# Patient Record
Sex: Female | Born: 1955 | Race: Black or African American | Hispanic: No | Marital: Married | Smoking: Never smoker
Health system: Southern US, Community
[De-identification: ages and names within clinical notes are randomized; demographics above are authoritative.]

## PROBLEM LIST (undated history)

## (undated) DIAGNOSIS — D249 Benign neoplasm of unspecified breast: Secondary | ICD-10-CM

## (undated) DIAGNOSIS — M48061 Spinal stenosis, lumbar region without neurogenic claudication: Secondary | ICD-10-CM

## (undated) DIAGNOSIS — D649 Anemia, unspecified: Secondary | ICD-10-CM

## (undated) DIAGNOSIS — R42 Dizziness and giddiness: Secondary | ICD-10-CM

## (undated) DIAGNOSIS — IMO0002 Reserved for concepts with insufficient information to code with codable children: Secondary | ICD-10-CM

## (undated) DIAGNOSIS — T7840XA Allergy, unspecified, initial encounter: Secondary | ICD-10-CM

## (undated) DIAGNOSIS — I10 Essential (primary) hypertension: Secondary | ICD-10-CM

## (undated) DIAGNOSIS — Z8619 Personal history of other infectious and parasitic diseases: Secondary | ICD-10-CM

## (undated) HISTORY — DX: Allergy, unspecified, initial encounter: T78.40XA

## (undated) HISTORY — DX: Dizziness and giddiness: R42

## (undated) HISTORY — DX: Benign neoplasm of unspecified breast: D24.9

## (undated) HISTORY — DX: Spinal stenosis, lumbar region without neurogenic claudication: M48.061

## (undated) HISTORY — DX: Personal history of other infectious and parasitic diseases: Z86.19

## (undated) HISTORY — DX: Essential (primary) hypertension: I10

## (undated) HISTORY — DX: Anemia, unspecified: D64.9

## (undated) HISTORY — DX: Reserved for concepts with insufficient information to code with codable children: IMO0002

---

## 2006-03-12 ENCOUNTER — Encounter (INDEPENDENT_AMBULATORY_CARE_PROVIDER_SITE_OTHER): Payer: Self-pay | Admitting: *Deleted

## 2006-04-11 ENCOUNTER — Encounter (INDEPENDENT_AMBULATORY_CARE_PROVIDER_SITE_OTHER): Payer: Self-pay | Admitting: *Deleted

## 2006-08-16 ENCOUNTER — Encounter (INDEPENDENT_AMBULATORY_CARE_PROVIDER_SITE_OTHER): Payer: Self-pay | Admitting: *Deleted

## 2006-12-19 ENCOUNTER — Encounter (INDEPENDENT_AMBULATORY_CARE_PROVIDER_SITE_OTHER): Payer: Self-pay | Admitting: *Deleted

## 2007-10-06 ENCOUNTER — Encounter (INDEPENDENT_AMBULATORY_CARE_PROVIDER_SITE_OTHER): Payer: Self-pay | Admitting: *Deleted

## 2007-12-10 ENCOUNTER — Encounter (INDEPENDENT_AMBULATORY_CARE_PROVIDER_SITE_OTHER): Payer: Self-pay | Admitting: *Deleted

## 2007-12-10 ENCOUNTER — Other Ambulatory Visit: Admission: RE | Admit: 2007-12-10 | Discharge: 2007-12-10 | Payer: Self-pay | Admitting: Obstetrics & Gynecology

## 2007-12-10 ENCOUNTER — Encounter: Payer: Self-pay | Admitting: Family

## 2007-12-11 ENCOUNTER — Encounter (INDEPENDENT_AMBULATORY_CARE_PROVIDER_SITE_OTHER): Payer: Self-pay | Admitting: *Deleted

## 2007-12-17 ENCOUNTER — Encounter: Payer: Self-pay | Admitting: Family

## 2008-01-07 LAB — CONVERTED CEMR LAB: Pap Smear: NORMAL

## 2008-06-06 ENCOUNTER — Emergency Department (HOSPITAL_BASED_OUTPATIENT_CLINIC_OR_DEPARTMENT_OTHER): Admission: EM | Admit: 2008-06-06 | Discharge: 2008-06-06 | Payer: Self-pay | Admitting: Emergency Medicine

## 2008-06-06 ENCOUNTER — Ambulatory Visit: Payer: Self-pay | Admitting: Interventional Radiology

## 2008-06-15 ENCOUNTER — Ambulatory Visit: Payer: Self-pay | Admitting: *Deleted

## 2008-06-15 DIAGNOSIS — I10 Essential (primary) hypertension: Secondary | ICD-10-CM | POA: Insufficient documentation

## 2008-06-15 DIAGNOSIS — J309 Allergic rhinitis, unspecified: Secondary | ICD-10-CM | POA: Insufficient documentation

## 2008-06-15 DIAGNOSIS — J40 Bronchitis, not specified as acute or chronic: Secondary | ICD-10-CM | POA: Insufficient documentation

## 2008-06-15 DIAGNOSIS — D649 Anemia, unspecified: Secondary | ICD-10-CM | POA: Insufficient documentation

## 2008-06-25 DIAGNOSIS — M48061 Spinal stenosis, lumbar region without neurogenic claudication: Secondary | ICD-10-CM | POA: Insufficient documentation

## 2008-06-25 DIAGNOSIS — IMO0002 Reserved for concepts with insufficient information to code with codable children: Secondary | ICD-10-CM | POA: Insufficient documentation

## 2008-06-30 ENCOUNTER — Ambulatory Visit: Payer: Self-pay | Admitting: *Deleted

## 2008-06-30 DIAGNOSIS — J45909 Unspecified asthma, uncomplicated: Secondary | ICD-10-CM | POA: Insufficient documentation

## 2008-07-14 ENCOUNTER — Telehealth (INDEPENDENT_AMBULATORY_CARE_PROVIDER_SITE_OTHER): Payer: Self-pay | Admitting: *Deleted

## 2009-03-16 ENCOUNTER — Ambulatory Visit: Payer: Self-pay | Admitting: Family

## 2009-03-18 ENCOUNTER — Ambulatory Visit: Payer: Self-pay | Admitting: Family

## 2009-03-18 LAB — CONVERTED CEMR LAB
CO2: 25 meq/L (ref 19–32)
Glucose, Bld: 90 mg/dL (ref 70–99)
HDL: 65 mg/dL (ref >39)
Hemoglobin: 13.5 g/dL (ref 12.0–15.0)
Lymphocytes Relative: 37 % (ref 12–46)
Lymphs Abs: 3.4 10*3/uL (ref 0.7–4.0)
MCHC: 32.2 g/dL (ref 30.0–36.0)
Monocytes Absolute: 0.8 10*3/uL (ref 0.1–1.0)
Monocytes Relative: 8 % (ref 3–12)
Neutro Abs: 5 10*3/uL (ref 1.7–7.7)
Neutrophils Relative %: 54 % (ref 43–77)
Potassium: 4.1 meq/L (ref 3.5–5.3)
RBC: 4.65 M/uL (ref 3.87–5.11)
Sodium: 140 meq/L (ref 135–145)
Total CHOL/HDL Ratio: 3.1
VLDL: 22 mg/dL (ref 0–40)
WBC: 9.3 10*3/uL (ref 4.0–10.5)

## 2009-03-21 ENCOUNTER — Encounter: Payer: Self-pay | Admitting: Family

## 2009-03-25 ENCOUNTER — Ambulatory Visit (HOSPITAL_BASED_OUTPATIENT_CLINIC_OR_DEPARTMENT_OTHER): Admission: RE | Admit: 2009-03-25 | Discharge: 2009-03-25 | Payer: Self-pay | Admitting: Internal Medicine

## 2009-03-25 ENCOUNTER — Other Ambulatory Visit: Admission: RE | Admit: 2009-03-25 | Discharge: 2009-03-25 | Payer: Self-pay | Admitting: Internal Medicine

## 2009-03-25 ENCOUNTER — Ambulatory Visit: Payer: Self-pay | Admitting: Diagnostic Radiology

## 2009-03-25 ENCOUNTER — Ambulatory Visit: Payer: Self-pay | Admitting: Family

## 2009-03-25 DIAGNOSIS — E785 Hyperlipidemia, unspecified: Secondary | ICD-10-CM

## 2009-03-25 DIAGNOSIS — E782 Mixed hyperlipidemia: Secondary | ICD-10-CM | POA: Insufficient documentation

## 2009-03-25 LAB — HM MAMMOGRAPHY: HM Mammogram: NEGATIVE

## 2009-04-22 ENCOUNTER — Telehealth (INDEPENDENT_AMBULATORY_CARE_PROVIDER_SITE_OTHER): Payer: Self-pay | Admitting: *Deleted

## 2009-04-25 ENCOUNTER — Ambulatory Visit: Payer: Self-pay | Admitting: Family

## 2009-04-25 DIAGNOSIS — T148XXA Other injury of unspecified body region, initial encounter: Secondary | ICD-10-CM | POA: Insufficient documentation

## 2009-04-26 ENCOUNTER — Telehealth (INDEPENDENT_AMBULATORY_CARE_PROVIDER_SITE_OTHER): Payer: Self-pay | Admitting: *Deleted

## 2009-10-18 ENCOUNTER — Encounter: Payer: Self-pay | Admitting: Family

## 2009-11-16 ENCOUNTER — Encounter: Payer: Self-pay | Admitting: Family

## 2009-11-16 HISTORY — PX: ENDOMETRIAL BIOPSY: SHX622

## 2009-12-06 LAB — CONVERTED CEMR LAB: Pap Smear: NORMAL

## 2009-12-30 ENCOUNTER — Ambulatory Visit: Payer: Self-pay | Admitting: Internal Medicine

## 2009-12-30 DIAGNOSIS — R51 Headache: Secondary | ICD-10-CM | POA: Insufficient documentation

## 2009-12-30 DIAGNOSIS — Z6841 Body Mass Index (BMI) 40.0 and over, adult: Secondary | ICD-10-CM | POA: Insufficient documentation

## 2009-12-30 DIAGNOSIS — R519 Headache, unspecified: Secondary | ICD-10-CM | POA: Insufficient documentation

## 2010-01-06 ENCOUNTER — Telehealth: Payer: Self-pay | Admitting: Family

## 2010-01-11 ENCOUNTER — Encounter: Payer: Self-pay | Admitting: Family

## 2010-01-11 DIAGNOSIS — E559 Vitamin D deficiency, unspecified: Secondary | ICD-10-CM | POA: Insufficient documentation

## 2010-01-16 ENCOUNTER — Telehealth: Payer: Self-pay | Admitting: Family

## 2010-01-16 ENCOUNTER — Ambulatory Visit: Payer: Self-pay | Admitting: Radiology

## 2010-01-16 ENCOUNTER — Emergency Department (HOSPITAL_BASED_OUTPATIENT_CLINIC_OR_DEPARTMENT_OTHER): Admission: EM | Admit: 2010-01-16 | Discharge: 2010-01-16 | Payer: Self-pay | Admitting: Emergency Medicine

## 2010-01-17 ENCOUNTER — Ambulatory Visit: Payer: Self-pay | Admitting: Internal Medicine

## 2010-01-17 ENCOUNTER — Encounter: Payer: Self-pay | Admitting: Internal Medicine

## 2010-01-17 DIAGNOSIS — R209 Unspecified disturbances of skin sensation: Secondary | ICD-10-CM | POA: Insufficient documentation

## 2010-01-17 DIAGNOSIS — R9431 Abnormal electrocardiogram [ECG] [EKG]: Secondary | ICD-10-CM | POA: Insufficient documentation

## 2010-01-21 ENCOUNTER — Encounter: Admission: RE | Admit: 2010-01-21 | Discharge: 2010-01-21 | Payer: Self-pay | Admitting: Internal Medicine

## 2010-01-23 ENCOUNTER — Telehealth: Payer: Self-pay | Admitting: Internal Medicine

## 2010-01-26 ENCOUNTER — Ambulatory Visit (HOSPITAL_COMMUNITY): Admission: RE | Admit: 2010-01-26 | Discharge: 2010-01-26 | Payer: Self-pay | Admitting: Internal Medicine

## 2010-01-26 ENCOUNTER — Encounter: Payer: Self-pay | Admitting: Internal Medicine

## 2010-01-26 ENCOUNTER — Ambulatory Visit: Payer: Self-pay | Admitting: Internal Medicine

## 2010-01-26 ENCOUNTER — Ambulatory Visit: Payer: Self-pay

## 2010-01-27 ENCOUNTER — Telehealth: Payer: Self-pay | Admitting: Internal Medicine

## 2010-01-30 ENCOUNTER — Telehealth: Payer: Self-pay | Admitting: Internal Medicine

## 2010-01-30 ENCOUNTER — Ambulatory Visit (HOSPITAL_BASED_OUTPATIENT_CLINIC_OR_DEPARTMENT_OTHER): Admission: RE | Admit: 2010-01-30 | Discharge: 2010-01-30 | Payer: Self-pay | Admitting: Internal Medicine

## 2010-01-30 ENCOUNTER — Ambulatory Visit: Payer: Self-pay | Admitting: Interventional Radiology

## 2010-01-30 ENCOUNTER — Ambulatory Visit: Payer: Self-pay | Admitting: Internal Medicine

## 2010-01-30 DIAGNOSIS — I776 Arteritis, unspecified: Secondary | ICD-10-CM | POA: Insufficient documentation

## 2010-01-30 LAB — CONVERTED CEMR LAB
ALT: 11 units/L (ref 0–35)
AST: 20 units/L (ref 0–37)
Anti Nuclear Antibody(ANA): NEGATIVE
Bilirubin, Direct: <0.1 mg/dL (ref 0.0–0.3)
Blood in Urine, dipstick: NEGATIVE
Complement C4, Body Fluid: 26 mg/dL (ref 16–47)
Glucose, Urine, Semiquant: NEGATIVE
Hep B S Ab: NEGATIVE
Nitrite: NEGATIVE
Protein, U semiquant: NEGATIVE
Specific Gravity, Urine: 1.015
Total Protein: 8.1 g/dL (ref 6.0–8.3)
WBC Urine, dipstick: NEGATIVE

## 2010-02-02 ENCOUNTER — Telehealth: Payer: Self-pay | Admitting: Internal Medicine

## 2010-02-16 ENCOUNTER — Telehealth: Payer: Self-pay | Admitting: Internal Medicine

## 2010-03-21 ENCOUNTER — Encounter: Payer: Self-pay | Admitting: Internal Medicine

## 2010-04-27 ENCOUNTER — Telehealth: Payer: Self-pay | Admitting: Internal Medicine

## 2010-05-03 ENCOUNTER — Ambulatory Visit: Admit: 2010-05-03 | Payer: Self-pay | Admitting: Internal Medicine

## 2010-05-09 NOTE — Progress Notes (Signed)
Summary: Side effects from BP Med  Phone Note Call from Patient   Caller: Patient Call For: Lemont Fillers FNP Summary of Call: Bp that pt. started lastnight as you asked her to do is causing her to have nausea, dizziness, drowziness, dry mouth, S.O.B, and fatigue feeling. Pt. states that she is going to stop this med. Initial call taken by: Michaelle Copas,  April 26, 2009 11:19 AM  Follow-up for Phone Call        Call patient and advise her to stop medication.  Advise patient to go to ER if severe shortness of breath.  I have sent a prescription to her pharmacy for a different BP med- she should start this medicine tomorrow if feeling better.   Follow-up by: Lemont Fillers FNP,  April 26, 2009 12:34 PM  Additional Follow-up for Phone Call Additional follow up Details #1::        spoke w/ patient says she isn't going to take a chance w/ having another reaction to medication so she is going to look for a natural alternative to help w/ bp but also notes that she has been under alot of stress and is working on diet and exercise to help resolved this issue...Marland KitchenMarland KitchenDoristine Devoid  April 26, 2009 3:16 PM     New/Updated Medications: HYDROCHLOROTHIAZIDE 25 MG TABS (HYDROCHLOROTHIAZIDE) one tablet by mouth daily Prescriptions: HYDROCHLOROTHIAZIDE 25 MG TABS (HYDROCHLOROTHIAZIDE) one tablet by mouth daily  #30 x 1   Entered and Authorized by:   Lemont Fillers FNP   Signed by:   Lemont Fillers FNP on 04/26/2009   Method used:   Electronically to        [facility]. [address]. [phone]* (retail)       [address]       [place], [place]  [address]       Ph: [phone] or [phone]       Fax: [phone]   RxID:   [id number]

## 2010-05-09 NOTE — Progress Notes (Signed)
Summary: Status update  Phone Note Call from Patient Call back at Home Phone [phone] Call back at [phone]   Caller: Patient Call For: D. Thomos Lemons DO Summary of Call: patient called and left voice  message stating she was seen by Dr Rutherford Guys and Laveda Norman dentist. She had a cavity filled and was informed that she did not need a root canal.  She states she has completed the trial of Benicar and would like to know what to do from here. She would like to know  if an earlier appointment could be made before December 13, for neuro evaluation. She states she had another episode yesterday with left temple and jaw pain which is still lingering this morning. Initial call taken by: Glendell Docker CMA,  February 16, 2010 8:27 AM  Follow-up for Phone Call        Pt states Dr Anne Hahn' office is not able to get her appointment moved up earlier [phone].  Pt states Dr Anne Hahn' office suggested that we call them to discuss need for earlier appt. Nicki Guadalajara Fergerson CMA Duncan Dull)  February 16, 2010 10:08 AM   Additional Follow-up for Phone Call Additional follow up Details #1::        Myriam Jacobson,  please see if neurologist at [facility] neuro can see pt earlier  D. Thomos Lemons DO  February 16, 2010 1:22 PM    Called [facility]  no sooner appt.  also call [facility] no appointment until February   Darral Dash  February 16, 2010 2:09 PM     Additional Follow-up for Phone Call Additional follow up Details #2::    darlene,  pt needs to continue benicar .  see rx D. Thomos Lemons DO,  February 16, 2010 1:23 PM  call placed to patient at [phone], she has been advised rx for Benicar has been sent to pharmacy.  New/Updated Medications: BENICAR 20 MG TABS (OLMESARTAN MEDOXOMIL) one by mouth once daily Prescriptions: BENICAR 20 MG TABS (OLMESARTAN MEDOXOMIL) one by mouth once daily  #30 x 3   Entered and Authorized by:   D. Thomos Lemons DO   Signed by:   D. Thomos Lemons DO on 02/16/2010   Method used:   Electronically to   [facility]. [address] [phone]* (retail)       [address]       [place], [place]  [address]       Ph: [phone] or [phone]       Fax: [phone]   RxID:   [id number]

## 2010-05-09 NOTE — Assessment & Plan Note (Signed)
Summary: Dr Artist Pais wants to see her prior to refill/mhf   Vital Signs:  Patient profile:   55 year old female Menstrual status:  postmenopausal Height:      67.5 inches Weight:      278.50 pounds BMI:     43.13 O2 Sat:      99 % on Room air Temp:     97.8 degrees F rectal Pulse rate:   74 / minute Pulse rhythm:   regular Resp:     20 per minute BP sitting:   146 / 76  (right arm) Cuff size:   large  Vitals Entered By: Glendell Docker CMA (January 30, 2010 3:43 PM)  O2 Flow:  Room air CC: Skin irritation Is Patient Diabetic? No Pain Assessment Patient in pain? no      Comments c/o irritation on forearms and shins , use of cortisone and tea tree oil soap with some improvement. She states she has burning from the cortisone cream. She states the numbness is related to her current symptoms. Appoitnment for dental work on the 8th of November.    Primary Care Provider:  Lemont Fillers FNP  CC:  Skin irritation.  History of Present Illness: 55 y/o AA female for f/u.  pt prev seen for unexplained facial sensation  MRI of brain negative IMPRESSION: 1. Normal MRI appearance of the brain for age. 2.  Negative bilateral fifth cranial nerve imaging. 3.  Symmetric appearing tonsillar pillar hypertrophy and maximal right level II cervical lymph nodes are nonspecific, but may be related to recent upper respiratory tract infection  2 D Echo reviewed Left ventricle: The cavity size was normal. There was mild focal     basal hypertrophy of the septum. Systolic function was normal. The     estimated ejection fraction was in the range of 60% to 65%. Wall     motion was normal; there were no regional wall motion abnormalities.     Doppler parameters are consistent with abnormal left ventricular     relaxation (grade 1 diastolic dysfunction).  pt still having abnormal facial sensation.    she also c/o rash on arms and legs  Preventive Screening-Counseling &  Management  Alcohol-Tobacco     Smoking Status: never  Allergies: 1)  ! Codeine 2)  ! 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    Social History: Occupation: housewife Married Never Smoked Alcohol use-no   husband is from [place]    Physical Exam  General:  alert, well-developed, and well-nourished.   Lungs:  normal respiratory effort, normal breath sounds, no crackles, and no wheezes.   Heart:  normal rate, regular rhythm, and no gallop.   Skin:  warm, dry  no suspicious lesions.   question lacy rash on arms and legs   Impression & Recommendations:  Problem # 1:  VASCULITIS (ICD-447.6) Pt with unexplained lacy rash on arms and legs.  consider vasculitis.   check labs  Orders: CRP, high sensitivity-[facility] ([id number]) T-Sed Rate (Automated) [phone]) T- * Misc. Laboratory test [phone]) T-Antinuclear Antib (ANA) [phone]) T-Hepatic Function [phone]) T-Hepatitis C Antibody [phone]) T-Hepatitis B Surface Antibody ([id number]) T-Hepatitis B Surface Antigen ([id number]) T-2 View  CXR, Same Day (71020.5TC) UA Dipstick w/o Micro (manual) (16109)  Problem # 2:  DISTURBANCE OF SKIN SENSATION (ICD-782.0)  Orders: T- * Misc. Laboratory test [phone]) Neurology Referral (Neuro)  Complete Medication List: 1)  Vitamin D3 50000 Unit Caps (Cholecalciferol) .... One tablet twice weekly for 8 weeks 2)  Multi-vitamin Tabs (Multiple vitamin) .... Take 1 tab by mouth at bedtime 3)  Fish Oil 1000 Mg Caps (Omega-3 fatty acids) .... 2 capsules by mouth once daily 4)  Benicar  20 Mg Tabs (Olmesartan medoxomil) .... One by mouth once daily 5)  Alprazolam 0.25 Mg Tabs (Alprazolam) .... One to two tabs 30 minutes before mri  Patient Instructions: 1)  Our office will contact you re:  blood test results   Orders Added: 1)  CRP, high sensitivity-[facility] [[id number]] 2)  T-Sed Rate (Automated) [[id number]] 3)  T- * Misc. Laboratory test [99999] 4)  T-Antinuclear Antib (ANA) [[id number]] 5)  T- * Misc. Laboratory test [99999] 6)  T-Hepatic Function [80076-22960] 7)  T-Hepatitis C Antibody [86803-23620] 8)  T-Hepatitis B Surface Antibody [86706-23590] 9)  T-Hepatitis B Surface Antigen [28413-24401] 10)  T-2 View CXR, Same Day [71020.5TC] 11)  Neurology Referral [Neuro] 12)  UA Dipstick w/o Micro (manual) [81002] 13)  Est. Patient Level III [02725]   Laboratory Results   Urine Tests    Routine Urinalysis   Color: yellow Appearance: Clear Glucose: negative   (Normal Range: Negative) Bilirubin: negative   (Normal Range: Negative) Ketone: negative   (Normal Range: Negative) Spec. Gravity: 1.015   (Normal Range: 1.003-1.035) Blood: negative   (Normal Range: Negative) pH: 6.0   (Normal Range: 5.0-8.0) Protein: negative   (Normal Range: Negative) Urobilinogen: 0.2   (Normal Range: 0-1) Nitrite: negative   (Normal Range: Negative) Leukocyte Esterace: negative   (Normal Range: Negative)

## 2010-05-09 NOTE — Letter (Signed)
Summary: [facility]  [facility]   Imported By: Lanelle Bal 01/30/2010 09:28:20  _____________________________________________________________________  External Attachment:    Type:   Image     Comment:   External Document

## 2010-05-09 NOTE — Miscellaneous (Signed)
  Clinical Lists Changes  Problems: Added new problem of VITAMIN D DEFICIENCY (ICD-268.9)

## 2010-05-09 NOTE — Progress Notes (Signed)
Summary: Skin irritation  Phone Note Call from Patient Call back at Home Phone [phone]   Caller: Patient Call For: Dr Artist Pais Summary of Call: patient called and left voice message requesting a rx for antibiotic until she has dermatology appointment. Her message state she is still having irritation on her ankles, ear and forearm. Initial call taken by: Glendell Docker CMA,  January 30, 2010 10:13 AM  Follow-up for Phone Call        we can not rx abx with examining pt Follow-up by: D. Thomos Lemons DO,  January 30, 2010 12:18 PM  Additional Follow-up for Phone Call Additional follow up Details #1::        call returned to patient at [phone], no answer. A detailed voice message was left informing patient per Dr Artist Pais instructions Additional Follow-up by: Glendell Docker CMA,  January 30, 2010 12:22 PM

## 2010-05-09 NOTE — Assessment & Plan Note (Signed)
Summary: left ear is peeling, headache & pain/dt   Vital Signs:  Patient profile:   55 year old female Menstrual status:  postmenopausal Height:      67.5 inches Weight:      283.50 pounds BMI:     43.91 O2 Sat:      98 % on Room air Temp:     98.2 degrees F oral Pulse rate:   60 / minute Pulse rhythm:   regular Resp:     20 per minute BP sitting:   140 / 80  (right arm) Cuff size:   large  Vitals Entered By: Glendell Docker CMA (December 30, 2009 9:28 AM)  O2 Flow:  Room air Is Patient Diabetic? No Pain Assessment Patient in pain? no      Comments bialteral ear pain and irritation for the past 5 days,left side facial pain   Primary Care Provider:  Lemont Fillers FNP   History of Present Illness: 55 y/o female c/o irritated scalp / face  flu shot one week ago hair loss - using wig (silk cap) pimple behind left ear edge of scalp feels itchy  Current Diet: Breakfast:  egg over easy, mini bagel,  slice of cheese, fruit Lunch:  chicken breast, salad, fruit,  occ rice,  loves pho DInner: asian food Snacks: Beverage:   Preventive Screening-Counseling & Management  Alcohol-Tobacco     Smoking Status: never  Allergies: 1)  ! Codeine 2)  ! 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   Neck:  No deformities, masses, or tenderness noted. Lungs:  normal respiratory effort, normal breath sounds, no crackles, and no wheezes.   Heart:  normal rate, regular rhythm, and no gallop.   Extremities:  No lower extremity edema   Impression & Recommendations:  Problem # 1:  FACIAL PAIN (ICD-784.0) Assessment Unchanged atypical left facial pain.  question early shingles.  we discussed red flag symptoms. start valtrex if she develops vesicular rash  Problem # 2:  OBESITY (ICD-278.00) Pt counseled on diet and exercise.  Complete Medication List: 1)  Vitamin D3 50000 Unit Caps (Cholecalciferol) .... One tablet twice weekly for 8 weeks 2)  Multi-vitamin Tabs (Multiple vitamin) .... Take 1 tab by mouth at bedtime 3)  Fish Oil 1000 Mg Caps (Omega-3 fatty acids) .... 2 capsules by mouth once daily  Patient Instructions: 1)  Call our office if your symptoms do not  improve or gets worse.  Current Allergies (reviewed today): ! CODEINE ! * OXYCODONE   Immunization History:  Influenza Immunization History:    Influenza:  historical (12/21/2009)    Preventive Care Screening  Last Flu  Shot:    Date:  12/21/2009    Results:  Historical   Pap Smear:    Date:  12/06/2009    Results:  normal     Vital Signs:  Patient Profile:   55 year old female Height:     67.5 inches Weight:      283.50 pounds BMI:     43.91 O2 Sat:      98 % Temp:     98.2 degrees F oral Pulse rate:   60 / minute Pulse rhythm:   regular Resp:     20 per minute BP sitting:   140 / 80 Cuff size:   large

## 2010-05-09 NOTE — Progress Notes (Signed)
Summary: Pt. would like her PAP results   Phone Note Call from Patient   Caller: Patient Summary of Call: [phone] Please call pt. back  with Pap results as soon as you can. Thank you Initial call taken by: Michaelle Copas,  April 22, 2009 1:15 PM  Follow-up for Phone Call        Chemira- would you please call and try to retrieve these results? Thanks Follow-up by: Lemont Fillers FNP,  April 22, 2009 1:36 PM  Additional Follow-up for Phone Call Additional follow up Details #1::        called pathology results to be faxed to office.........Marland KitchenDoristine Devoid  April 22, 2009 2:21 PM      Appended Document: Pt. would like her PAP results  Called patient and reviewed negative PAP results.  Patient tells me that she has some bruising and firmness on her abdomen following "bumping it", has apt with me monday- advised patient to have it seen over the weekend if she develops warmth or swelling.

## 2010-05-09 NOTE — Letter (Signed)
Summary: [facility]  [facility]   Imported By: Lanelle Bal 01/30/2010 09:29:08  _____________________________________________________________________  External Attachment:    Type:   Image     Comment:   External Document

## 2010-05-09 NOTE — Progress Notes (Signed)
Summary: Pain in jaw resolved  Phone Note Call from Patient Call back at Home Phone [phone]   Caller: Patient Summary of Call: SW pt, said dentist found cavity on lower left jaw rear molar, found infection around root which was causing pain in ear & left side of face, dentist prescribed pencillin for 7 days, pt is not expecting a call back just wanted note in her record Initial call taken by: Lannette Donath,  January 06, 2010 4:26 PM

## 2010-05-09 NOTE — Progress Notes (Signed)
Summary: Echo Results  Phone Note Call from Patient   Caller: Patient Details for Reason:  Echo results Summary of Call: Pt called wanting  results of echo  please call  [phone] Initial call taken by: Darral Dash,  January 27, 2010 12:38 PM  Follow-up for Phone Call        2 D Echo shows normal ejection fraction no wall motion abnormality  I will discuss in detail at next OV Follow-up by: D. Thomos Lemons DO,  January 27, 2010 1:39 PM  Additional Follow-up for Phone Call Additional follow up Details #1::        call placed to patient at [phone], she has been advised per Dr Artist Pais instructions Additional Follow-up by: Glendell Docker CMA,  January 27, 2010 2:48 PM

## 2010-05-09 NOTE — Progress Notes (Signed)
Summary: Numbness   Phone Note Call from Patient Call back at Home Phone [phone]   Caller: Patient Call For: Lemont Fillers FNP Summary of Call: patient called and stated she was having numbness in the left side of her face ,ear, shoulder, and arm, and feels she needed to be seen. She states she was seen at the dentist for an was informed that she had an infection in her mouth. She was treated with antibiotics, and since then she has developed the above symptoms. She said that she called her dentist and they could not see her until Thursday, and was informed to contact her primary care Dr.  She was informed we could see her this afternoon. She felt that she could not wait that long to be seen. She states she will go the the emergency room for treatment. Initial call taken by: Glendell Docker CMA,  January 16, 2010 8:47 AM     Appended Document: Numbness  Patient called , went to ED this am,  and was referral to [facility] , neg CT . can't get appt without referral.   Please send referral   Phone Note Outgoing Call   Call placed by: Glendell Docker CMA,  January 16, 2010 4:47 PM Call placed to: Patient Summary of Call: call placed to patient regarding referral request.  Patient states she was informed by  Dr Bernette Mayers in the ER that she had parathesis and advised a follow up with Dr Artist Pais for referral to Neurology. Patient staets she as numbness  in her face nad lower jaw, she denies having tingling. She states she had CT that was  normal, blood work was normal, and her vitals appear to have been normal.  She feels that her concern is dental related, becaused her dentist informed her that she had a root canal that was splintered and a cavity in the area that she is complaining of. She states she was treated with anitbiotics which relieved the symptoms, but they are back. She would like to know if she should follow up with her  dentist, or be seen by neurology.  She did attempt to schedule with  them, but they are not able to see her until December 4th. She was advised to contact Dr Artist Pais to see if she could be seen sooner if needed. Initial call taken by: Glendell Docker CMA,  January 16, 2010 4:58 PM  Follow-up for Phone Call        I suggest OV Follow-up by: D. Thomos Lemons DO,  January 16, 2010 5:14 PM    Additional Follow-up for Phone Call Additional follow up Details #2::    call returned to patient at [phone] she was advised to return for follow up per Dr Artist Pais. Appointment scheduled for 01/17/2010 @ 1:30pm Follow-up by: Glendell Docker CMA,  January 16, 2010 5:20 PM

## 2010-05-09 NOTE — Assessment & Plan Note (Signed)
Summary: RETUN OFFICE VISIT/DK   Vital Signs:  Patient profile:   55 year old female Menstrual status:  postmenopausal Height:      67.5 inches Weight:      284.25 pounds BMI:     44.02 O2 Sat:      99 % on Room air Temp:     98.0 degrees F oral Pulse rate:   73 / minute Pulse rhythm:   regular Resp:     22 per minute BP sitting:   158 / 88  (right arm) Cuff size:   large  Vitals Entered By: Glendell Docker CMA (January 17, 2010 2:22 PM)  O2 Flow:  Room air CC: Jaw and cheek numbness Is Patient Diabetic? No Pain Assessment Patient in pain? no      Comments c/o numbness on left jaw and cheek ongoing  since the last office visit, took pcn for 7 days and numbness releived, once antibiotics were complete the symptoms returned   Primary Care Provider:  Lemont Fillers FNP  CC:  Jaw and cheek numbness.  History of Present Illness: 55 y/o AA female having ongoing issues with left sided facial pain left side of lower jaw feels numb like she is coming off novacaine throbbing sensation - lower jaw and cheek prev was near the ear - now better numbness of most noticeable symptom  finished taking PCN 4 x days symptoms resolved except mild tooth ache pt feels symptoms coming from dental cause pt seen by dentist. he is worried her symptoms may be atypical CVA or cardiac ischemia  htn - norvasc caused dizziness and nausea  Preventive Screening-Counseling & Management  Alcohol-Tobacco     Smoking Status: never  Allergies: 1)  ! Codeine 2)  ! 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   Family History: family history of "female cancer" Family History of  breast cancer Family History of  stroke Family History Diabetes 1st degree relative Family History Hypertension  Mom- DM, Pancreatic cancer Dad- unkown 1 son- alive and well Daughter- alive and well  3 half sister- one sister with breast CA/DM, one sister with asthma, other sister's health history unknown 2 half sisters-unknown      Social History: Occupation: housewife Married Never Smoked Alcohol use-no   husband is from [place]   Review of Systems  The patient denies chest pain and dyspnea on exertion.         she exercises on a regular basis  Physical Exam  General:  alert, well-developed, and well-nourished.   Head:  normocephalic and atraumatic.   Mouth:  pharynx pink and moist.  no dental pain Lungs:  normal respiratory effort, normal breath sounds, no crackles, and no wheezes.   Heart:  normal rate, regular rhythm, and no gallop.   Extremities:  No lower extremity edema Neurologic:  cranial nerves II-XII intact, strength normal in all extremities, sensation intact to light touch, sensation intact to pinprick, and gait normal.   Skin:  warm, dry Psych:  normally interactive, good eye contact, not anxious appearing, and not depressed appearing.     Impression & Recommendations:  Problem # 1:  FACIAL PAIN (ICD-784.0)  55 y/o  AA female having persistent numbness of left side of her face. she has some lower dental issues.  some improvement in pain with tx with PCN but persistent numbness I advise MRI of Brain.  Orders: Radiology Referral (Radiology)  Problem # 2:  HYPERTENSION (ICD-401.9) Assessment: Deteriorated she did not tolerated norvasc or hctz.   trial of benicar  Her updated medication list for this problem includes:    Benicar 20 Mg Tabs (Olmesartan medoxomil) ..... One by mouth once daily  BP today: 158/88 Prior BP: 140/80 (12/30/2009)  Labs Reviewed: K+:  4.1 (03/18/2009) Creat: : 0.97 (03/18/2009)   Chol: 202 (03/18/2009)   HDL: 65 (03/18/2009)   LDL: 115 (03/18/2009)   TG: 108 (03/18/2009)  Problem # 3:  ELECTROCARDIOGRAM, ABNORMAL (ICD-794.31)  Orders: Echo Referral (Echo)  Complete Medication List: 1)  Vitamin D3 50000 Unit Caps (Cholecalciferol) .... One tablet twice weekly for 8 weeks 2)  Multi-vitamin Tabs (Multiple vitamin) .... Take 1 tab by mouth at bedtime 3)  Fish Oil 1000 Mg Caps (Omega-3 fatty acids) .... 2 capsules by mouth once daily 4)  Benicar 20 Mg Tabs (Olmesartan medoxomil) .... One by mouth once daily 5)  Amoxicillin-pot Clavulanate 875-125 Mg Tabs (Amoxicillin-pot clavulanate) .... One by mouth two times a day 6)  Alprazolam 0.25 Mg Tabs (Alprazolam) .... One to two tabs 30 minutes before mri  Patient Instructions: 1)  Please schedule a follow-up appointment in 1 month. Prescriptions: ALPRAZOLAM 0.25 MG TABS (ALPRAZOLAM) one to two tabs 30 minutes before MRI  #5 x 0   Entered and Authorized by:   D. Thomos Lemons DO   Signed by:   D. Thomos Lemons DO on 01/17/2010   Method used:   Print then Give to Patient   RxID:   [id number] AMOXICILLIN-POT CLAVULANATE 875-125 MG TABS (AMOXICILLIN-POT CLAVULANATE) one by mouth two times a day  #14 x 0   Entered and Authorized by:   D. Thomos Lemons DO   Signed by:   D. Thomos Lemons DO on 01/17/2010   Method used:   Electronically to        [facility]. [address] [phone]* (retail)       [address]       [place], [place]  [address]       Ph: [phone] or [phone]       Fax: [phone]   RxID:   [id number]     Prevention & Chronic Care Immunizations   Influenza vaccine: Historical  (12/21/2009)    Tetanus booster: 04/09/2007: Td    Pneumococcal vaccine: Not documented  Colorectal Screening   Hemoccult: Not documented    Colonoscopy: Not documented  Other Screening   Pap smear: normal  (12/06/2009)    Mammogram: ASSESSMENT: Negative  - BI-RADS 1^MM DIGITAL SCREENING  (03/25/2009)   Smoking status: never  (01/17/2010)  Lipids   Total Cholesterol: 202  (03/18/2009)   LDL: 115  (03/18/2009)   LDL Direct: Not documented   HDL: 65  (03/18/2009)   Triglycerides: 108  (03/18/2009)    SGOT (AST): Not documented   SGPT (ALT): Not documented   Alkaline phosphatase: Not documented   Total bilirubin: Not documented  Hypertension   Last Blood Pressure: 158 / 88  (01/17/2010)   Serum creatinine: 0.97  (03/18/2009)   Serum potassium 4.1  (03/18/2009)  Self-Management Support :    Hypertension self-management support: Not documented    Lipid self-management support: Not  documented

## 2010-05-09 NOTE — Progress Notes (Signed)
Summary: MRI Results  Phone Note Outgoing Call   Summary of Call: call pt - MRI of brain negative for stroke Initial call taken by: D. Thomos Lemons DO,  January 23, 2010 12:14 PM  Follow-up for Phone Call        call placed to patient at [phone], no answer, a detailed voice message was left informing patient per Dr Artist Pais instructions. Message left for patient to call if any questions Follow-up by: Glendell Docker CMA,  January 23, 2010 3:04 PM

## 2010-05-09 NOTE — Progress Notes (Signed)
Summary: Lab Results  Phone Note Outgoing Call   Summary of Call: call pt - recent blood test not suggest of vasculitis Initial call taken by: D. Thomos Lemons DO,  February 02, 2010 12:14 PM  Follow-up for Phone Call        call paced to patient at [phone], no answer. A detalied voice message was left informing patient per Dr Artist Pais instructions.  Follow-up by: Glendell Docker CMA,  February 02, 2010 1:44 PM

## 2010-05-09 NOTE — Progress Notes (Signed)
Summary: Test  Results  Phone Note Outgoing Call   Summary of Call: call pt - cxr is normal Initial call taken by: D. Thomos Lemons DO,  January 30, 2010 5:24 PM  Follow-up for Phone Call        call placed to patient at [phone], no answer. A detailed voice message was left informing patient per Dr Artist Pais instructions Follow-up by: Glendell Docker CMA,  January 31, 2010 8:24 AM

## 2010-05-09 NOTE — Assessment & Plan Note (Signed)
Summary: KNOT ON ABD  LEFT SIDE/HEA   Vital Signs:  Patient profile:   55 year old female Menstrual status:  postmenopausal Weight:      277 pounds Pulse rate:   82 / minute BP sitting:   140 / 80  (left arm)  Vitals Entered By: Doristine Devoid (April 25, 2009 1:33 PM) CC: knot on L side of abdomen    Primary Care Provider:  Lemont Fillers FNP  CC:  knot on L side of abdomen .  History of Present Illness: Courtney Robinson is a 55 year old female who presents today with c/o injury to the left side of her abdomen.  This occurred 1 week ago.  Notes that it initially became "black and blue"  then left a knot.  This "knot" is mildly tender and is getting smaller per patient  HTN- patient has not been taking Norvasc- though she is agreeable to start this medicine.    Allergies: 1)  ! Codeine 2)  ! * Oxycodone  Review of Systems       +tenderness L anterior abdomen at area of trauma, denies lower extremity edema  Physical Exam  General:  Well-developed,well-nourished,in no acute distress; alert,appropriate and cooperative throughout examination Lungs:  Normal respiratory effort, chest expands symmetrically. Lungs are clear to auscultation, no crackles or wheezes. Heart:  Normal rate and regular rhythm. S1 and S2 normal without gallop, murmur, click, rub or other extra sounds. Abdomen:  No ecchymosis noted,  pea sized firm hematoma noted beneath the skin on L side of abdomen above umbilicus.     Impression & Recommendations:  Problem # 1:  HEMATOMA (ICD-924.9) Assessment New Very small and resolving on it's own. Patient instructed to call if area becomes swollen or red.   Problem # 2:  HYPERTENSION (ICD-401.9) Assessment: Unchanged patient has not been taking norvasc, I advised patient to start med and continue to follow a low sodium diet.  Patient agrees with this plan.  She tells me that she plans to  also work hard on diet and exercise. f/u in 1 month.   Her updated  medication list for this problem includes:    Norvasc 5 Mg Tabs (Amlodipine besylate) ..... One tablet by mouth daily  BP today: 140/80 Prior BP: 140/80 (03/25/2009)  Labs Reviewed: K+: 4.1 (03/18/2009) Creat: : 0.97 (03/18/2009)   Chol: 202 (03/18/2009)   HDL: 65 (03/18/2009)   LDL: 115 (03/18/2009)   TG: 108 (03/18/2009)  Complete Medication List: 1)  Norvasc 5 Mg Tabs (Amlodipine besylate) .... One tablet by mouth daily  Patient Instructions: 1)  Please schedule a follow-up appointment in 1 month.

## 2010-05-11 NOTE — Progress Notes (Signed)
Summary: Benicar   reaction  Phone Note Call from Patient   Caller: Patient Details for Reason: Allergic reaction Summary of Call: Pt having itching and rash arm,  ?Benicar   allergic reaction  please call pt    phone [phone] Initial call taken by: Darral Dash,  April 27, 2010 8:49 AM  Follow-up for Phone Call        call returned to patient , she feels she is having allergic reaction to Benicar, unexplained skin rashes and itching. She states she googled her symptoms and reviewing the side effects, red rash upper arm, forearm, and legs are itching. She states she stopped the Benicar on Sunday. Her irritation occurs shortly after she takes the Benicar. She asked that Dr Artist Pais be made aware, and would like to know what he advises. Follow-up by: Glendell Docker CMA,  April 27, 2010 8:56 AM  Additional Follow-up for Phone Call Additional follow up Details #1::        stop benicar follow up appt within 1 week earlier appt if rash gets worse Additional Follow-up by: D. Thomos Lemons DO,  April 27, 2010 5:37 PM    Additional Follow-up for Phone Call Additional follow up Details #2::    call placed to patient at [phone], she has been advised per Dr Artist Pais instructions. Patient states her rash has resolved, but she stil has some irritation. She has scheduled for Wednesday 05/03/2010 @ 10:30 am Follow-up by: Glendell Docker CMA,  April 28, 2010 8:27 AM

## 2010-05-11 NOTE — Consult Note (Signed)
Summary: [facility]  [facility]   Imported By: Lanelle Bal 03/29/2010 13:18:06  _____________________________________________________________________  External Attachment:    Type:   Image     Comment:   External Document

## 2010-05-24 ENCOUNTER — Emergency Department (HOSPITAL_BASED_OUTPATIENT_CLINIC_OR_DEPARTMENT_OTHER)
Admission: EM | Admit: 2010-05-24 | Discharge: 2010-05-24 | Disposition: A | Discharge status: Home / Self Care | Payer: Managed Care, Other (non HMO) | Attending: Emergency Medicine | Admitting: Emergency Medicine

## 2010-05-24 DIAGNOSIS — K625 Hemorrhage of anus and rectum: Secondary | ICD-10-CM | POA: Insufficient documentation

## 2010-05-24 DIAGNOSIS — E669 Obesity, unspecified: Secondary | ICD-10-CM | POA: Insufficient documentation

## 2010-05-24 LAB — BASIC METABOLIC PANEL
BUN: 12 mg/dL (ref 6–23)
CO2: 27 mEq/L (ref 19–32)
Calcium: 9.7 mg/dL (ref 8.4–10.5)
Chloride: 106 mEq/L (ref 96–112)
Creatinine, Ser: 1 mg/dL (ref 0.4–1.2)
GFR calc Af Amer: >60 mL/min (ref >60)
GFR calc non Af Amer: 58 mL/min — ABNORMAL LOW (ref >60)
Glucose, Bld: 101 mg/dL — ABNORMAL HIGH (ref 70–99)
Potassium: 4.2 mEq/L (ref 3.5–5.1)
Sodium: 145 mEq/L (ref 135–145)

## 2010-05-24 LAB — DIFFERENTIAL
Basophils Absolute: 0 10*3/uL (ref 0.0–0.1)
Basophils Relative: 0 % (ref 0–1)
Eosinophils Absolute: 0.1 10*3/uL (ref 0.0–0.7)
Neutro Abs: 5.5 10*3/uL (ref 1.7–7.7)
Neutrophils Relative %: 64 % (ref 43–77)

## 2010-05-24 LAB — URINALYSIS, ROUTINE W REFLEX MICROSCOPIC
Bilirubin Urine: NEGATIVE
Hgb urine dipstick: NEGATIVE
Ketones, ur: NEGATIVE mg/dL
Nitrite: NEGATIVE
Protein, ur: NEGATIVE mg/dL
Specific Gravity, Urine: 1.004 — ABNORMAL LOW (ref 1.005–1.030)
Urine Glucose, Fasting: NEGATIVE mg/dL
Urobilinogen, UA: 0.2 mg/dL (ref 0.0–1.0)
pH: 6 (ref 5.0–8.0)

## 2010-05-24 LAB — CBC
HCT: 36.6 % (ref 36.0–46.0)
Hemoglobin: 12.6 g/dL (ref 12.0–15.0)
MCH: 30.2 pg (ref 26.0–34.0)
MCHC: 34.4 g/dL (ref 30.0–36.0)
MCV: 87.8 fL (ref 78.0–100.0)
Platelets: 242 10*3/uL (ref 150–400)
RBC: 4.17 MIL/uL (ref 3.87–5.11)
RDW: 12.6 % (ref 11.5–15.5)
WBC: 8.6 10*3/uL (ref 4.0–10.5)

## 2010-05-25 ENCOUNTER — Inpatient Hospital Stay (HOSPITAL_COMMUNITY)
Admission: EM | Admit: 2010-05-25 | Discharge: 2010-05-27 | DRG: 378 | Disposition: A | Discharge status: Home / Self Care | Payer: Managed Care, Other (non HMO) | Attending: Internal Medicine | Admitting: Internal Medicine

## 2010-05-25 DIAGNOSIS — K5731 Diverticulosis of large intestine without perforation or abscess with bleeding: Principal | ICD-10-CM | POA: Diagnosis present

## 2010-05-25 DIAGNOSIS — Z87898 Personal history of other specified conditions: Secondary | ICD-10-CM

## 2010-05-25 DIAGNOSIS — I10 Essential (primary) hypertension: Secondary | ICD-10-CM | POA: Diagnosis present

## 2010-05-25 DIAGNOSIS — D62 Acute posthemorrhagic anemia: Secondary | ICD-10-CM | POA: Diagnosis present

## 2010-05-25 DIAGNOSIS — J309 Allergic rhinitis, unspecified: Secondary | ICD-10-CM | POA: Diagnosis present

## 2010-05-25 DIAGNOSIS — E669 Obesity, unspecified: Secondary | ICD-10-CM | POA: Diagnosis present

## 2010-05-25 LAB — POCT I-STAT, CHEM 8
BUN: 11 mg/dL (ref 6–23)
Creatinine, Ser: 1 mg/dL (ref 0.4–1.2)
Potassium: 3.9 mEq/L (ref 3.5–5.1)
Sodium: 140 mEq/L (ref 135–145)

## 2010-05-25 LAB — CBC
HCT: 34.8 % — ABNORMAL LOW (ref 36.0–46.0)
HCT: 32.2 % — ABNORMAL LOW (ref 36.0–46.0)
HCT: 32.7 % — ABNORMAL LOW (ref 36.0–46.0)
Hemoglobin: 11.8 g/dL — ABNORMAL LOW (ref 12.0–15.0)
Hemoglobin: 10.7 g/dL — ABNORMAL LOW (ref 12.0–15.0)
MCH: 30.3 pg (ref 26.0–34.0)
MCV: 89.7 fL (ref 78.0–100.0)
Platelets: 210 10*3/uL (ref 150–400)
RBC: 3.9 MIL/uL (ref 3.87–5.11)
RBC: 3.59 MIL/uL — ABNORMAL LOW (ref 3.87–5.11)
RBC: 3.57 MIL/uL — ABNORMAL LOW (ref 3.87–5.11)
RDW: 13.2 % (ref 11.5–15.5)
WBC: 6.7 10*3/uL (ref 4.0–10.5)
WBC: 8 10*3/uL (ref 4.0–10.5)

## 2010-05-25 LAB — DIFFERENTIAL
Basophils Absolute: 0 10*3/uL (ref 0.0–0.1)
Basophils Relative: 1 % (ref 0–1)
Lymphocytes Relative: 27 % (ref 12–46)
Monocytes Relative: 8 % (ref 3–12)
Neutro Abs: 5.6 10*3/uL (ref 1.7–7.7)
Neutrophils Relative %: 64 % (ref 43–77)

## 2010-05-25 LAB — APTT: aPTT: 175 seconds — ABNORMAL HIGH (ref 24–37)

## 2010-05-25 LAB — TYPE AND SCREEN: Antibody Screen: NEGATIVE

## 2010-05-26 DIAGNOSIS — K625 Hemorrhage of anus and rectum: Secondary | ICD-10-CM

## 2010-05-26 DIAGNOSIS — D62 Acute posthemorrhagic anemia: Secondary | ICD-10-CM

## 2010-05-26 LAB — CBC
Hemoglobin: 10.5 g/dL — ABNORMAL LOW (ref 12.0–15.0)
MCH: 29.3 pg (ref 26.0–34.0)
RBC: 3.58 MIL/uL — ABNORMAL LOW (ref 3.87–5.11)

## 2010-05-26 LAB — COMPREHENSIVE METABOLIC PANEL
ALT: 12 U/L (ref 0–35)
AST: 15 U/L (ref 0–37)
CO2: 24 mEq/L (ref 19–32)
Chloride: 109 mEq/L (ref 96–112)
Creatinine, Ser: 0.94 mg/dL (ref 0.4–1.2)
GFR calc Af Amer: >60 mL/min (ref >60)
GFR calc non Af Amer: >60 mL/min (ref >60)
Sodium: 139 mEq/L (ref 135–145)
Total Bilirubin: 0.6 mg/dL (ref 0.3–1.2)

## 2010-05-27 ENCOUNTER — Encounter: Payer: Self-pay | Admitting: Internal Medicine

## 2010-05-27 DIAGNOSIS — K5730 Diverticulosis of large intestine without perforation or abscess without bleeding: Secondary | ICD-10-CM

## 2010-05-27 LAB — CBC
MCH: 29.3 pg (ref 26.0–34.0)
MCHC: 32 g/dL (ref 30.0–36.0)
MCV: 91.4 fL (ref 78.0–100.0)
Platelets: 215 10*3/uL (ref 150–400)
RDW: 13.1 % (ref 11.5–15.5)

## 2010-05-30 ENCOUNTER — Telehealth: Payer: Self-pay | Admitting: Internal Medicine

## 2010-05-31 NOTE — Procedures (Addendum)
Summary: Colonoscopy  Patient: Kimila Kainz Note: All result statuses are Final unless otherwise noted.  Tests: (1) Colonoscopy (COL)   COL Colonoscopy           DONE     [facility]     [address] [place], [place]  [address]          COLONOSCOPY PROCEDURE REPORT          PATIENT:  Raneen, Jaffer  MR#:  [id number]     BIRTHDATE:  02-Mar-1956, 54 yrs. old  GENDER:  female     ENDOSCOPIST:  Wilhemina Bonito. Eda Keys, MD     REF. BY:  [facility],     PROCEDURE DATE:  05/27/2010     PROCEDURE:  Diagnostic Colonoscopy     ASA CLASS:  Class II     INDICATIONS:  rectal bleeding     MEDICATIONS:   Fentanyl 100 mcg IV, Versed 10 mg IV, Benadryl 50     mg IV          DESCRIPTION OF PROCEDURE:   After the risks benefits and     alternatives of the procedure were thoroughly explained, informed     consent was obtained.  Digital rectal exam was performed and     revealed no abnormalities.   The Pentax Colonoscope [id number]     endoscope was introduced through the anus and advanced to the     cecum, which was identified by both the appendix and ileocecal     valve, without limitations.Time to cecum = 2:00 min.  The quality     of the prep was excellent, using MoviPrep.  The instrument was     then slowly withdrawn (time = 14:00 min) as the colon was fully     examined.     <<PROCEDUREIMAGES>>          FINDINGS:  The terminal ileum appeared normal.  Moderate     diverticulosis was found throughout the colon.  Otherwise normal     colonoscopy without other polyps, masses, vascular ectasias, or     inflammatory changes.   Retroflexed views in the rectum revealed     no abnormalities.NO BLEEDING OR BLOOD IN COLON.    The scope was     then withdrawn from the patient and the procedure completed.          COMPLICATIONS:  None     ENDOSCOPIC IMPRESSION:     1) Normal terminal ileum     2) Moderate diverticulosis throughout the colon.     3) Otherwise normal colonoscopy    4) Self limited diverticular bleed     RECOMMENDATIONS:     1) Continue current colorectal screening recommendations for     "routine risk" patients with a repeat colonoscopy in 10 years.     2) OK for discharge this afternoon     ______________________________     Wilhemina Bonito. Eda Keys, MD          CC:  Thomos Lemons, DO; The Patient          n.     eSIGNED:   Diann Bangerter N. Eda Keys at 05/27/2010 09:47 AM          [place], [place], [id number]  Note: An exclamation mark (!) indicates a result that was not dispersed into the flowsheet. Document Creation Date: 05/27/2010 9:47 AM _______________________________________________________________________  (1) Order result status: Final Collection or observation date-time: 05/27/2010 09:39  Requested date-time:  Receipt date-time:  Reported date-time:  Referring Physician:   Ordering Physician: Fransico Setters [phone]) Specimen Source:  Source: Launa Grill Order Number: [id number] Lab site:

## 2010-06-06 NOTE — Progress Notes (Signed)
Summary: pt requests blood test results  Phone Note Call from Patient Call back at [facility] Phone [phone]   Summary of Call: pt called and said that she will be seeing dr.willis at [facility] neurology on 3.20. they are requesting she bring in the blood test results for that appt.please assist. Initial call taken by: Elba Barman,  May 30, 2010 2:16 PM  Follow-up for Phone Call        call returned to patient she states Dr Anne Hahn office has recieved the office notes , but did not get labs results. She states Dr. Anne Hahn is requesting lab results. Dr [facility]. Follow-up by: Glendell Docker CMA,  May 30, 2010 2:43 PM  Additional Follow-up for Phone Call Additional follow up Details #1::        Lab results faxed to Dr Anne Hahn office per patient request. Additional Follow-up by: Glendell Docker CMA,  May 30, 2010 3:42 PM

## 2010-06-07 NOTE — Discharge Summary (Signed)
  NAMEMALIKA, Courtney               ACCOUNT NO.:  [id number]  MEDICAL RECORD NO.:  [id number]           PATIENT TYPE:  I  LOCATION:  1430                         FACILITY:  [facility]  PHYSICIAN:  Knowledge Escandon I Deiondre Harrower, MD      DATE OF BIRTH:  May 31, 1955  DATE OF ADMISSION:  05/25/2010 DATE OF DISCHARGE:  05/27/2010                              DISCHARGE SUMMARY   PRIMARY CARE PHYSICIAN:  Barbette Hair. Artist Pais, DO  DISCHARGE DIAGNOSES: 1. Lower gastrointestinal bleeding, felt to be secondary to     diverticular bleeding, self-limited. 2. History of Lyme disease. 3. History of fibroadenoma of the right breast. 4. History of spinal stenosis and herniated disk. 5. Obesity.  DISCHARGE MEDICATIONS:  The patient will discharged with her home medication, mainly docusate.  PROCEDURE:  Status post colonoscopy, which was so diverticular through the colon.  Normal terminal ileum.  Self-limited diverticular bleed.  HISTORY OF PRESENT ILLNESS:  This is a 55 year old female presented with painless pure blood per rectum.  After visiting  [facility] where the patient underwent anoscopy, felt it could be secondary to hemorrhoidal bleeding and tear.  After that, the patient continued to have bleeding and felt dizzy. 1. Lower GI bleeding.  The patient admitted to the hospital.  H and H     checked.  Hemoglobin mildly dropped to 9.9 and hematocrit 30.9.     The patient remained asymptomatic.  Today, hemoglobin 9.9 and     hematocrit 30.9.  Lower GI bleeding, most probably secondary to     diverticular bleeding.  The patient was advised to followup with     her primary care physician within in the next 2-3 weeks.     Currently, we felt the patient is medically stable for discharge.     Joia Doyle Bosie Helper, MD     HIE/MEDQ  D:  05/27/2010  T:  05/27/2010  Job:  [id number]  Electronically Signed by Ebony Cargo MD on 06/07/2010 02:15:21 PM

## 2010-06-22 LAB — CBC
MCV: 90.8 fL (ref 78.0–100.0)
Platelets: 242 10*3/uL (ref 150–400)
RDW: 12.6 % (ref 11.5–15.5)
WBC: 6.7 10*3/uL (ref 4.0–10.5)

## 2010-06-22 LAB — BASIC METABOLIC PANEL
BUN: 13 mg/dL (ref 6–23)
Creatinine, Ser: 0.9 mg/dL (ref 0.4–1.2)
GFR calc non Af Amer: >60 mL/min (ref >60)

## 2010-06-22 LAB — DIFFERENTIAL
Basophils Absolute: 0 10*3/uL (ref 0.0–0.1)
Basophils Relative: 1 % (ref 0–1)
Eosinophils Relative: 2 % (ref 0–5)
Lymphocytes Relative: 33 % (ref 12–46)
Neutro Abs: 3.8 10*3/uL (ref 1.7–7.7)

## 2010-06-28 NOTE — H&P (Signed)
Courtney Robinson, Courtney Robinson               ACCOUNT NO.:  [id number]  MEDICAL RECORD NO.:  [id number]           PATIENT TYPE:  E  LOCATION:  WLED                         FACILITY:  [facility]  PHYSICIAN:  Massie Maroon, MD        DATE OF BIRTH:  09/13/1955  DATE OF ADMISSION:  05/25/2010 DATE OF DISCHARGE:                             HISTORY & PHYSICAL   CHIEF COMPLAINT:  Rectal bleeding.  HISTORY OF PRESENT ILLNESS:  A 55 year old female with a history of obesity and Lyme disease in the past apparently presents with complaints of hard stool yesterday morning.  In the afternoon, she had another stool that was okay, and then at about 4:00 p.m., she went to the bathroom to urinate and had bright red blood per rectum.  There was blood in the stool as well as on toilet paper.  She had 4 more further episodes and then went to the emergency room at [facility]. There, she was given anoscopy and found to have a rectal tear as well as hemorrhoids.  The patient was treated with ProctoFoam and Dulcolax and given Vicodin to take home with her.  She continued to have more episodes of bright red blood per rectum, and then at 1:00 a.m. the morning when she was in bed, she woke up and went to the bathroom and had clots of blood coming from per rectum.  This prompted her to come to the emergency room by 911, and she was evaluated, and hemoglobin was intact, although slightly lower than before.  Her hemoglobin was 11.8. The patient denies any chest pain, palpitations, shortness of breath, nausea, vomiting, or NSAID use.  She has not had a prior colonoscopy. The patient will be admitted for complaints of rectal bleeding.  PAST MEDICAL HISTORY: 1. Allergic rhinitis. 2. History of Lyme disease. 3. Anemia. 4. Vertigo. 5. Fiber adenoma of the right breast. 6. Spinal stenosis/herniated disk.  PAST SURGICAL HISTORY: 1. Endometrial biopsy on November 16, 2009 - disordered proliferative     endometrium,  negative for hyperplasia, atypia, or malignancy. 2. Fatty cyst removal off her head.  SOCIAL HISTORY:  Occupation; housewife, married.  Her husband is from [place].  Tobacco use, never smoked.  Alcohol use, none.  The patient has 2 children.  She formerly used to work at [employer] for [employer] but was downsized.  This was in [place], [place].  FAMILY HISTORY:  Mother died at age 26 from pancreatic cancer.  She is not sure about her father's history.  There is no family history of colon cancer that she knows.  ALLERGIES:  CODEINE caused gastric irritation.  MEDICATIONS:  Please see med rec, Dulcolax, ProctoFoam, and Vicodin 5/325 as needed.  REVIEW OF SYSTEMS:  Negative for fever, chills.  Negative for weight loss.  Negative for all 10 organ systems except for pertinent positives stated above.  PHYSICAL EXAMINATION:  VITAL SIGNS:  Temperature 98.1, pulse 74, blood pressure 164/90 and repeat is 128/78, and pulse ox 97% on room air. HEENT:  Anicteric.  Pink conjunctivae. Neck:  No JVD, no bruit. HEART:  Regular rate and rhythm.  S1-S2.  No murmurs, gallops, or rubs. LUNGS:  Clear to auscultation bilaterally. ABDOMEN:  Morbidly obese, nontender, and nondistended.  Positive bowel sounds. EXTREMITIES:  No cyanosis, clubbing, or edema.  LABORATORY DATA:  WBC 8.6, hemoglobin 12.6, and platelet count 242,000. Sodium 145, potassium 4.2, BUN 12, and creatinine 1.0.  WBC 8.8, hemoglobin 11.8, and platelet count 223,000.  BUN 11 and creatinine 1.0.  ASSESSMENT: 1. Rectal bleed. 2. Obesity. 3. History of anemia. 4. History of vertigo. 5. History of Lyme disease. 6. History of spinal stenosis/disk herniation.  PLAN:  The patient will be admitted and placed on telemetry unit.  We will check CBC q.6h. initially, and the patient will be hydrated gently with normal saline.  We will also type and screen the patient in case her hemoglobin drops.  The patient will be made  n.p.o. and we will consult [facility] this morning for further evaluation of the patient. Please call consult this a.m.  The patient will be placed on Protonix 40 mg IV b.i.d. for GI prophylaxis, though this seems more like a lower GI bleed.  DVT prophylaxis with SCDs.     Massie Maroon, MD     JYK/MEDQ  D:  05/25/2010  T:  05/25/2010  Job:  [id number]  cc:   Barbette Hair. [place], DO [address] [place], [place] [address]  Electronically Signed by Pearson Grippe MD on 06/28/2010 10:02:58 PM

## 2010-07-13 ENCOUNTER — Telehealth: Payer: Self-pay | Admitting: *Deleted

## 2010-07-13 NOTE — Telephone Encounter (Signed)
She needs to establish with PCP in [place].  They can arrange referral

## 2010-07-13 NOTE — Telephone Encounter (Signed)
Patient called and left voice message stating she has relocated to [facility] and has started to get the numbing in her face again. She states she has contacted Dr Anne Hahn office for a referral to the Neurology Dept at the [facility], however she has not heard anything back from then regarding the status. She would like to know if Dr Artist Pais would authorize and fax a referral to them at [phone].

## 2010-07-17 NOTE — Telephone Encounter (Signed)
Call placed to patient at [phone], she was advised per Dr Artist Pais instructions.

## 2010-11-08 ENCOUNTER — Encounter: Payer: Self-pay | Admitting: Internal Medicine

## 2011-04-10 HISTORY — PX: CHOLECYSTECTOMY: SHX55

## 2011-05-25 ENCOUNTER — Emergency Department: Payer: Self-pay | Admitting: Emergency Medicine

## 2011-05-26 LAB — LIPASE, BLOOD: Lipase: 43 U/L — ABNORMAL LOW (ref 73–393)

## 2011-05-26 LAB — COMPREHENSIVE METABOLIC PANEL
Chloride: 104 mmol/L (ref 98–107)
Co2: 25 mmol/L (ref 21–32)
EGFR (African American): >60
EGFR (Non-African Amer.): 59 — ABNORMAL LOW
SGOT(AST): 24 U/L (ref 15–37)
SGPT (ALT): 19 U/L

## 2011-05-26 LAB — CBC
MCH: 30.4 pg (ref 26.0–34.0)
MCHC: 33.2 g/dL (ref 32.0–36.0)
MCV: 92 fL (ref 80–100)
Platelet: 232 10*3/uL (ref 150–440)
RBC: 4.25 10*6/uL (ref 3.80–5.20)

## 2011-05-26 LAB — TROPONIN I: Troponin-I: <0.02 ng/mL

## 2011-05-28 ENCOUNTER — Encounter: Payer: Self-pay | Admitting: Internal Medicine

## 2011-05-28 ENCOUNTER — Inpatient Hospital Stay: Payer: Self-pay | Admitting: Internal Medicine

## 2011-05-28 LAB — COMPREHENSIVE METABOLIC PANEL
Anion Gap: 10 (ref 7–16)
Bilirubin,Total: 0.9 mg/dL (ref 0.2–1.0)
Chloride: 105 mmol/L (ref 98–107)
Co2: 26 mmol/L (ref 21–32)
Creatinine: 1.11 mg/dL (ref 0.60–1.30)
EGFR (African American): >60
EGFR (Non-African Amer.): 54 — ABNORMAL LOW
Potassium: 3.9 mmol/L (ref 3.5–5.1)
SGPT (ALT): 69 U/L

## 2011-05-28 LAB — CBC
MCH: 30.7 pg (ref 26.0–34.0)
MCHC: 33.4 g/dL (ref 32.0–36.0)
MCV: 92 fL (ref 80–100)
Platelet: 210 10*3/uL (ref 150–440)
RDW: 13.6 % (ref 11.5–14.5)
WBC: 10.6 10*3/uL (ref 3.6–11.0)

## 2011-05-28 LAB — TROPONIN I: Troponin-I: <0.02 ng/mL

## 2011-05-29 LAB — CBC WITH DIFFERENTIAL/PLATELET
Basophil %: 0.4 %
Eosinophil #: 0.1 10*3/uL (ref 0.0–0.7)
Lymphocyte #: 1.8 10*3/uL (ref 1.0–3.6)
Lymphocyte %: 27.2 %
Neutrophil #: 3.8 10*3/uL (ref 1.4–6.5)
Platelet: 210 10*3/uL (ref 150–440)
RDW: 13.4 % (ref 11.5–14.5)
WBC: 6.8 10*3/uL (ref 3.6–11.0)

## 2011-05-29 LAB — BASIC METABOLIC PANEL
Anion Gap: 7 (ref 7–16)
Calcium, Total: 8.5 mg/dL (ref 8.5–10.1)
Co2: 28 mmol/L (ref 21–32)
EGFR (African American): >60
EGFR (Non-African Amer.): >60
Glucose: 100 mg/dL — ABNORMAL HIGH (ref 65–99)

## 2011-05-29 LAB — TSH: Thyroid Stimulating Horm: 1.24 u[IU]/mL

## 2011-05-31 LAB — PATHOLOGY REPORT

## 2011-06-02 LAB — HEMOGLOBIN: HGB: 11.6 g/dL — ABNORMAL LOW (ref 12.0–16.0)

## 2011-06-02 LAB — OCCULT BLOOD X 1 CARD TO LAB, STOOL: Occult Blood, Feces: NEGATIVE

## 2011-06-20 ENCOUNTER — Ambulatory Visit: Payer: Managed Care, Other (non HMO) | Admitting: Internal Medicine

## 2011-11-20 ENCOUNTER — Emergency Department: Payer: Self-pay | Admitting: Emergency Medicine

## 2011-11-20 LAB — BASIC METABOLIC PANEL
Anion Gap: 4 — ABNORMAL LOW (ref 7–16)
BUN: 15 mg/dL (ref 7–18)
Co2: 30 mmol/L (ref 21–32)
Creatinine: 1.1 mg/dL (ref 0.60–1.30)
EGFR (African American): >60
EGFR (Non-African Amer.): 56 — ABNORMAL LOW
Glucose: 118 mg/dL — ABNORMAL HIGH (ref 65–99)

## 2011-11-20 LAB — CBC
MCH: 30.5 pg (ref 26.0–34.0)
MCHC: 34.3 g/dL (ref 32.0–36.0)
Platelet: 264 10*3/uL (ref 150–440)

## 2011-11-20 LAB — CK TOTAL AND CKMB (NOT AT ARMC): CK, Total: 155 U/L (ref 21–215)

## 2013-09-19 ENCOUNTER — Ambulatory Visit: Payer: Self-pay | Admitting: Family Medicine

## 2014-06-15 DIAGNOSIS — M25569 Pain in unspecified knee: Secondary | ICD-10-CM | POA: Insufficient documentation

## 2014-07-27 ENCOUNTER — Ambulatory Visit: Admit: 2014-07-27 | Disposition: A | Discharge status: Home / Self Care | Payer: Self-pay

## 2014-07-29 ENCOUNTER — Ambulatory Visit
Admit: 2014-07-29 | Disposition: A | Discharge status: Home / Self Care | Payer: Self-pay | Attending: Internal Medicine | Admitting: Internal Medicine

## 2014-08-01 NOTE — Op Note (Signed)
PATIENT NAME:  Courtney Robinson, Courtney Robinson [facility] MR#:  [id number] DATE OF BIRTH:  1955-08-02  DATE OF PROCEDURE:  05/30/2011  PREOPERATIVE DIAGNOSIS: Biliary colic.   POSTOPERATIVE DIAGNOSIS: Acute cholecystitis.   PROCEDURE: Laparoscopic cholecystectomy.   SURGEON: Rodena Goldmann, MD  ANESTHESIA: General.   DESCRIPTION OF PROCEDURE: With the patient in the supine position after the induction of appropriate general anesthesia, the patient's abdomen was prepped with ChloraPrep and draped with sterile towels. The patient was placed in the head down, feet up position. A small infraumbilical incision was made in the standard fashion and carried down bluntly through the subcutaneous tissue. The Veress needle was used to cannulate the peritoneal cavity. CO2 was insufflated to appropriate pressure measurements. When approximately 2.5 liters of CO2 were instilled, the Veress needle was withdrawn and an 11 mm applied medical port was inserted into the peritoneal cavity. Intraperitoneal position was confirmed, CO2 was reinsufflated. The patient was placed in the head up, feet down position and rotated slightly to the left side. A subxiphoid transverse incision was made and an 11 mm port was inserted under direct vision. Two lateral ports, 5 mm in size, were inserted under direct vision. The gallbladder was significantly distended and erythematous with obvious edema and inflammatory change. The gallbladder was aspirated of 50 mL of dark, dirty motor oil colored bile without difficulty. The gallbladder was then grasped and retracted superiorly and laterally exposing the hepatoduodenal ligament. The cystic artery and cystic duct were identified. The cystic duct was clipped on the gallbladder side and opened. An on-table cholangiogram was attempted, but the catheter could not be inserted through the small cystic duct. The catheter was withdrawn. The cystic duct was doubly clipped on the common duct side and divided. The cystic artery was  doubly clipped and divided. The gallbladder was then dissected free from its bed and delivered using hook and cautery apparatus. Once the gallbladder was free, the camera was switched to the subxiphoid port and the gallbladder was brought through the umbilical port using the 11 mm grasping instrument. The area was copiously suctioned and irrigated. The midline fascia was closed with three sutures of 0 Vicryl using a needle pass apparatus. The abdomen was then desufflated and all ports were withdrawn without difficulty. The skin incisions were closed with 5-0 nylon, and the area infiltrated with 0.25% Marcaine for postoperative pain control. Sterile dressings were applied. The patient was returned to the Recovery Room having tolerated the procedure well. Sponge, instrument, and needle counts were correct x2, in the Operating Room.  ____________________________ Rodena Goldmann III, MD rle:slb D: 05/30/2011 11:29:35 ET T: 05/30/2011 11:53:34 ET JOB#: [id number]  cc: Rodena Goldmann III, MD, <Dictator> Jill Side, MD Rodena Goldmann MD ELECTRONICALLY SIGNED 05/30/2011 15:45

## 2014-08-01 NOTE — Consult Note (Signed)
Chief Complaint:   Subjective/Chief Complaint S/P cholecystectomy. EGD yesterday was unremarkable. No further GI recommendations. Will sign off. Please call me if needed. Thanks.   Electronic Signatures: Jill Side (MD)  (Signed [phone] 18:01)  Authored: Chief Complaint   Last Updated: 20-Feb-13 18:01 by Jill Side (MD)

## 2014-08-01 NOTE — H&P (Signed)
PATIENT NAME:  Courtney Robinson, Courtney Robinson [facility] MR#:  [id number] DATE OF BIRTH:  [birth year]  DATE OF ADMISSION:  05/28/2011  PRIMARY CARE PHYSICIAN: None.   CHIEF COMPLAINT: Friday night ate a fatty meal, felt like a gas pressure in the abdomen, took a Gas-X, started vomiting, had severe abdominal pain 10 out of 10 in intensity in her epigastric area radiating back to the back. She felt bloated in the abdomen. Currently her pain is 6 out of 10 in intensity with some nausea. She was seen in the Emergency Room, had surgeon see her for gallstones seen on an ultrasound but there was no evidence of cholecystitis. She felt a little bit better, was discharged home on Reglan, ranitidine, and Zofran. Still has been having some intermittent pain, drinking and eating very little, having some broth and some ice. Pain today is 10 out of 10 in intensity. Came back into the hospital for further evaluation and hospitalist services were contacted for further evaluation.   PAST MEDICAL HISTORY:  1. Hypertension.  2. Lyme disease. 3. Obesity.  4. Shingles back in 2012.   PAST SURGICAL HISTORY: Fatty cysts removed from her skull and back.   ALLERGIES: Codeine makes her sick and Benicar.   MEDICATIONS: Just recently prescribed: 1. Reglan 10 mg q.8 hours.  2. Ranitidine 150 mg b.i.d.  3. Zofran p.r.n.   SOCIAL HISTORY: No smoking. No alcohol. No drug use. Currently not working.   FAMILY HISTORY: Mother died at 28 of pancreatic cancer. Father unknown medical history.   REVIEW OF SYSTEMS: CONSTITUTIONAL: Positive for chills. No fever or sweats. Positive for weight gain, 40 pounds in one year. Positive for fatigue. EYES: She does wear glasses. EARS, NOSE, MOUTH, AND THROAT: No hearing loss. No sore throat. No difficulty swallowing. CARDIOVASCULAR: No chest pain. No palpitations. RESPIRATORY: No shortness of breath. No coughing, no sputum, no hemoptysis. GASTROINTESTINAL: Positive for nausea. Positive for vomiting. Positive for  abdominal pain. No current diarrhea but diarrhea a few weeks ago. No bright red blood per rectum. No melena. GENITOURINARY: No burning on urination. No hematuria. MUSCULOSKELETAL: No joint pain or muscle pain. INTEGUMENTARY: No rashes or eruptions. NEUROLOGIC: No fainting or blackouts. PSYCHIATRIC: Under stress with recent move. ENDOCRINE: No thyroid problems. HEMATOLOGIC/LYMPHATIC: No anemia. No easy bruising or bleeding.   PHYSICAL EXAMINATION:   VITAL SIGNS: Pulse 82, respirations 18, blood pressure 166/70, pulse oximetry 96%.   GENERAL: Obese female sitting up in bed.   EYES: Conjunctivae and lids normal. Pupils equal, round, and reactive to light. Extraocular muscles intact. No nystagmus.   EARS, NOSE, MOUTH, AND THROAT: Tympanic membrane no erythema. Nasal mucosa no erythema. Throat no erythema. No exudate seen. Lips and gums normal.   NECK: No JVD. No bruits. No lymphadenopathy. No thyromegaly. No thyroid nodules palpated.   RESPIRATORY: Lungs clear to auscultation. No use of accessory muscles to breathe. No rhonchi, rales, or wheeze heard.   CARDIOVASCULAR: S1, S2 normal. No gallops, rubs, or murmurs heard. Carotid upstroke 2+ bilaterally. No bruits. Dorsalis pedis pulses 1+ bilaterally. 2+ edema of the lower extremity.   ABDOMEN: Soft. Positive tenderness in the epigastric area. No organomegaly/splenomegaly. Normoactive bowel sounds. No masses felt.   LYMPHATIC: No lymph nodes in the neck.   MUSCULOSKELETAL: No clubbing. 2+ edema. No cyanosis.   SKIN: No rashes or ulcers seen.   NEUROLOGIC: Cranial nerves II through XII grossly intact. Deep tendon reflexes 2+ bilateral lower extremities.   PSYCHIATRIC: The patient is oriented to person, place, and time.  LABORATORY AND RADIOLOGICAL DATA: White blood cell count 10.6, hemoglobin and hematocrit 12.5 and 37.5, platelet count 210, glucose 111, BUN 10, creatinine 1.11, sodium 141, potassium 3.9, chloride 105, CO2 26, calcium 8.5.  Liver function tests AST elevated at 141, total protein 7.9, albumin 3.5, GFR greater than 60, lipase 56. Troponin negative.   Going back on the 16th, abdomen flat and upright no evidence of bowel obstruction or perforation. Ultrasound of the abdomen showed cholelithiasis. No evidence of acute cholecystitis, fatty infiltration of the liver. Chest x-ray at that time showed no acute cardiopulmonary disease. EKG at that time showed normal sinus rhythm, 63 beats per minute, left axis deviation.   ASSESSMENT AND PLAN:  1. Abdominal pain in the epigastric area. GI ordered a CT scan of the abdomen and pelvis for further evaluation. Will give IV Protonix drip just in case ulcer disease. GI consult with Dr. Dionne Milo for endoscopy in the a.m. P.r.n. IV pain medications, p.r.n. IV nausea medications, and IV fluid hydration for symptomatic management.  2. Obesity with BMI of 44.4. Weight loss recommended.  3. Weight gain. Will check a TSH and T4.  4. EKG ordered.  5. Increased liver function tests with history of gallstones. Will see what the CT scan shows and what the endoscopy shows, less likely gallbladder at this point.   TIME SPENT ON ADMISSION: 55 minutes.   ____________________________ Tana Conch. Leslye Peer, MD rjw:drc D: 05/28/2011 20:23:37 ET T: 05/29/2011 06:16:49 ET JOB#: [id number]  cc: Tana Conch. Leslye Peer, MD, <Dictator> Marisue Brooklyn MD ELECTRONICALLY SIGNED 05/29/2011 15:26

## 2014-08-01 NOTE — Consult Note (Signed)
PATIENT NAME:  Courtney Robinson, Courtney Robinson [facility] MR#:  [id number] DATE OF BIRTH:  [birth year]  DATE OF CONSULTATION:  05/29/2011  REFERRING PHYSICIAN:  Prime Doc  CONSULTING PHYSICIAN:  Rodena Goldmann III, MD  PRIMARY CARE PHYSICIAN: None.   BRIEF HISTORY: Courtney Robinson is a 59 year old woman admitted through the Emergency Room on 05/28/2011 with gas pressure and mid epigastric abdominal discomfort associated with mild nausea and vomiting. The pain was rated 10 out of 10, in the epigastric area, radiating through to her back. She felt extremely bloated and had significant indigestion. She had some mild nausea and then had a couple episodes of vomiting. She had been seen previously in the Emergency Room with similar symptoms and work-up at that time identified possible gallstones and sludge on her gallbladder ultrasound without evidence of acute cholecystitis. At that time, she was felt to have more evidence of peptic ulcer disease and no surgery was entertained. She began to feel better on a regimen of Zofran and ranitidine. However, her pain became much more severe on the day of admission and presented back to the Emergency Room with 10 out of 10 pain and was admitted for further evaluation. She denies any previous history of hepatitis, yellow jaundice, pancreatitis, peptic ulcer disease, previous diagnosis of gallbladder disease, or diverticulitis. She has had a colonoscopy which demonstrated diverticulosis. She denies any cardiac disease or diabetes. She does have a history of hypertension and shingles.   Liver function studies were slightly elevated with transaminase above normal, but normal bilirubin and alkaline phosphatase. White blood cell count was normal. The gastroenterology service was consulted. The patient has significant history of recent NSAID usage on a constant basis. There was some concern that her symptoms could be related to possible gastritis or peptic ulcer disease. The GI service is planning an endoscopy  later today.   CURRENT MEDICATIONS:  1. Reglan 10 mg p.o. three times daily. 2. Ranitidine 150 mg p.o. twice a day. 3. Zofran p.r.n.   DRUG ALLERGIES: Codeine.  PAST SURGICAL HISTORY:  She has no previous surgical history other than some lipoma excisions.   SOCIAL HISTORY: She has no history of alcohol usage or smoking. She does not use illicit drugs. She is currently unemployed.   FAMILY HISTORY: Noncontributory.   REVIEW OF SYSTEMS: Positive for recent weight gain. She does have increasing malaise and fatigue. She does not have any history of hearing loss or visual changes. She has had no neck pain or discomfort. She has no shortness of breath or dyspnea on exertion. She has no cough or sputum production. She has no palpitations or ischemic chest pain. Gastrointestinal symptoms are noted above. On GU she has no history of urinary symptoms including hematuria or dysuria. She denies any history of lower extremity or musculoskeletal problems. She has no neurologic symptoms, although she did have a history of numbness along her left face which was worked up extensively with CT and MRI. 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 She does not have any significant depression or mood changes.   PHYSICAL EXAMINATION:   GENERAL: She is an alert, pleasant woman in no significant distress. She is mildly obese.   VITALS: Pulse is 88 and regular. Blood pressure is 158/74.   HEENT: Examination is unremarkable with normocephalic head, no scleral icterus and no facial deformities.   NECK: Supple without adenopathy or discomfort. Her trachea is midline.   LUNGS: Clear bilaterally with no adventitious sounds, and she has normal pulmonary excursion.  HEART: No murmurs or gallops to my ear, and she seems to be in normal sinus rhythm.   ABDOMEN: Benign with no significant tenderness. No organomegaly. No masses. No hernias. She has no rebound or guarding. She has active bowel sounds.    EXTREMITIES: Lower extremity exam reveals no deformity, full range of motion, and 1+ edema.   PSYCHIATRIC: Normal affect and normal orientation.   IMPRESSION/RECOMMENDATIONS: This woman's symptoms are very consistent with biliary tract disease, but certainly cannot rule out the possibility of peptic ulcer disease with her exposure to nonsteroidal antiinflammatory medications. I agree with the plan for an EGD. We talked to the patient about the various options available to her. We will discuss potential for surgical intervention should the EGD be normal. They are in agreement with this plan. ____________________________ Micheline Maze, MD rle:slb D: 05/29/2011 13:20:24 ET T: 05/29/2011 14:18:02 ET JOB#: [id number]  cc: Rodena Goldmann III, MD, <Dictator> Jill Side, MD Rodena Goldmann MD ELECTRONICALLY SIGNED 05/30/2011 9:58

## 2014-08-01 NOTE — Discharge Summary (Signed)
PATIENT NAME:  Courtney Robinson, Courtney Robinson MR#:  [id number] DATE OF BIRTH:  1955/12/03  DATE OF ADMISSION:  05/28/2011 DATE OF DISCHARGE:  06/02/2011  BRIEF HISTORY: Patient was admitted to the hospital through the Emergency Room on 05/28/2011 by internal medicine service for abdominal pain in the midepigastric, right upper quadrant area. She had symptoms intermittently over the last several months but initial concern was for possible gastroesophageal reflux disease or peptic ulcer disease. Ultrasound at the time of Emergency Room evaluation identified gallstones but she had no evidence of cholecystitis at the time. Her laboratory values at the time of admission demonstrated a normal white blood cell count at 10,000. Liver function studies largely unremarkable. Her AST was slightly elevated at 141. She was admitted to the hospital, placed on IV antibiotics. She underwent EGD on 02/19 performed by Dr. Dionne Milo. She did not have any other significant gastric pathology and so the surgical service reconsulted. We felt it was likely biliary tract disease as the source of her current problems. After appropriate preoperative preparation and informed consent, she was taken to surgery on the morning of 05/30/2011 where she underwent a laparoscopic cholecystectomy. She did have evidence of acute cholecystitis. Gallbladder was tense, distended with significant edema and inflammatory changes. Cholangiogram could not be completed as the catheter could not be inserted through the very small cystic duct. Anatomy was confirmed and she had the duct divided, the gallbladder removed. Postoperatively she had some mild nausea and some mild pain control issues that gradually improved over the next 48 hours. Discharged on the 23rd. She was discharged on the Vicodin for pain.   OTHER DISCHARGE MEDICATIONS:  1. Reglan 10 mg p.o. q.6 hours p.r.n.  2. Ranitidine 150 mg p.o. b.i.d.  3. Tramadol 50 mg q.6 hours p.r.n.  4. Protonix 40 mg p.o.  daily.  5. Toradol 30 mg p.o. q.6 hours p.r.n.  6. Phenergan 12.5 mg p.o. q.6 hours p.r.n.   FINAL DISCHARGE DIAGNOSIS: Acute cholecystitis.      PROCEDURE: Laparoscopic cholecystectomy.  ____________________________ Rodena Goldmann III, MD rle:cms D: 06/07/2011 14:21:50 ET T: 06/08/2011 11:06:54 ET JOB#: [id number]  cc: Rodena Goldmann III, MD, <Dictator> Jill Side, MD  Rodena Goldmann MD ELECTRONICALLY SIGNED 06/08/2011 22:30

## 2014-08-01 NOTE — Consult Note (Signed)
Chief Complaint:   Subjective/Chief Complaint EGD unremarkable except for mild gastritis.  Recommend surgical consult with Dr. Pat Patrick for possible cholecystectomy.   Electronic Signatures: Jill Side (MD)  (Signed 19-Feb-13 17:00)  Authored: Chief Complaint   Last Updated: 19-Feb-13 17:00 by Jill Side (MD)

## 2014-08-01 NOTE — Consult Note (Signed)
PATIENT NAME:  Courtney Robinson, Courtney Robinson [facility] MR#:  [id number] DATE OF BIRTH:  24-Nov-1955  DATE OF CONSULTATION:  05/29/2011  REFERRING PHYSICIAN: Loletha Grayer, MD CONSULTING PHYSICIAN:  Jill Side, MD  REASON FOR CONSULTATION: Abdominal pain.   HISTORY OF PRESENT ILLNESS: This is a 59 year old African American female. The patient was in the Emergency Room about two days ago with epigastric pain that started about 24 hours prior to her arrival to the Emergency Room. Lab work-up was quite unremarkable. Ultrasound showed gallstones without signs of cholecystitis or choledocholithiasis. The patient was discharged home on b.i.d. Zantac with the plan for her to follow up with me yesterday. We tried to contact the patient yesterday morning, although the number was incorrect. Later on yesterday morning the patient's husband called me around 4:00 that the patient is in severe epigastric pain again. The patient's husband was asked to bring her to the office for evaluation and possible endoscopy the next day, although he decided to take her back to the Emergency Room. The patient was seen by me yesterday evening in the Emergency Room. She was complaining of epigastric pain, although after arrival to the Emergency Room the pain started to subside again without any narcotics. She had some nausea and vomiting initially, but not yesterday. She denied any diarrhea, constipation, fever, or chills. According to the patient, these symptoms are new and she has never had this kind of pain before. She takes at least six Advil a day and she has been doing it for a while for some musculoskeletal problem. No melena, hematochezia, hematemesis, was reported by the patient.   PAST MEDICAL HISTORY:  1. Hypertension.  2. Lyme disease. 3. Obesity. 4. Shingles.   PAST SURGICAL HISTORY: Fatty cyst removed from her skull and back.   ALLERGIES: Codeine as well as Benicar.   HOME MEDICATIONS:  1. Ranitidine 150 mg b.i.d.  2. Zofran  p.r.n.  3. Reglan 10 mg t.i.d.   SOCIAL HISTORY: She does not smoke or drink.   FAMILY HISTORY: Unremarkable.   REVIEW OF SYSTEMS: According to Dr. Marshia Ly note, is positive for chills, although she denied any fever or chills to me. Review of systems is grossly unremarkable otherwise.   PHYSICAL EXAMINATION:  GENERAL: Morbidly obese female, does not appear to be in any acute distress, quite awake and alert.   HEENT: Unremarkable. No jaundice was noted.   NECK: Neck veins are flat.   LUNGS: Grossly clear to auscultation bilaterally with fair air entry and no added sounds.   CARDIOVASCULAR: Regular rate and rhythm.   ABDOMEN: Mild epigastric tenderness without any rebound. Bowel sounds are positive. The abdominal examination is otherwise quite benign.   NEUROLOGIC: She is fully awake, alert, and oriented and neurological examination is unremarkable.   LABORATORY, DIAGNOSTIC, AND RADIOLOGICAL DATA: White cell count is normal at 10.2. Hemoglobin was 12.9 on admission, is 11.7 today after hydration. The rest of the CBC is unremarkable. Chemistries are fine. TSH is 1.24. BUN and creatinine normal. Liver enzymes were normal on initial presentation, although yesterday AST was noted to be 141. The rest of the liver enzymes are still normal. Serum lipase is 56. CT scan of the abdomen and pelvis was done yesterday which again showed gallstones as seen previously on ultrasound. Mild thickening of the gallbladder wall was noted on the CT scan as well as a small amount of pericholecystic fluid. These findings were not seen on ultrasound 48 hours ago. CT was otherwise unremarkable.   ASSESSMENT AND PLAN:  The patient is with episodic epigastric pain which is quite severe. She feels better now. The patient takes quite a bit of Advil on a daily basis raising concerns about possible peptic ulcer disease. The patient also has gallstones and now CT scan is somewhat suggestive of cholecystitis. The patient's  white cell count is normal and she is afebrile. The patient was started on IV proton pump inhibitor. I will proceed with an upper GI endoscopy first. If the upper GI endoscopy shows an ulcer, then that will be the most likely cause of her abdominal pain. If upper GI endoscopy is negative, then consideration will be given to a surgical consultation for possible cholecystitis. The plan has been discussed with the patient in detail. The procedure of upper GI endoscopy was explained to her in detail as well. She is in full agreement. We will proceed with an upper GI endoscopy today. Further recommendations to follow. Continue proton pump inhibitor.    ____________________________ Jill Side, MD si:ap D: 05/29/2011 11:33:24 ET T: 05/29/2011 11:59:25 ET JOB#: [id number]  cc: Jill Side, MD, <Dictator> Jill Side MD ELECTRONICALLY SIGNED 05/30/2011 18:37

## 2014-08-01 NOTE — Consult Note (Signed)
Brief Consult Note: Diagnosis: Abdominal pain and h/o NSAID use raising concern about possible PUD.   Patient was seen by consultant.   Orders entered.   Discussed with Attending MD.   Comments: Probable PUD. Gallstones without evidence of cholecystitis.  Recommendations: CT scan of abdomen. IV PPI. Clear liquid diet. EGD in am. Discussed with Dr. Manuella Ghazi.  Electronic Signatures: Jill Side (MD)  (Signed [phone] 19:11)  Authored: Brief Consult Note   Last Updated: 18-Feb-13 19:11 by Jill Side (MD)

## 2014-08-01 NOTE — Consult Note (Signed)
PATIENT NAME:  Courtney Robinson, WATERS MR#:  [id number] DATE OF BIRTH:  01-10-1956  DATE OF CONSULTATION:  05/26/2011  REFERRING PHYSICIAN:   CONSULTING PHYSICIAN:  Jerrol Banana. Burt Knack, MD  CHIEF COMPLAINT: Epigastric pain.   HISTORY OF PRESENT ILLNESS: This is a patient with epigastric pain that came on after eating a fatty meal, but it was many hours afterwards. She ate lunch and then it started at 7:00 p.m. She did not eat any dinner last night. She has had nausea and vomiting multiple times. No hematemesis. She has had multiple prior episodes like this in the past and has recently been told she has gallstones. I was asked to see the patient for biliary colic versus peptic ulcer disease. The patient describes no back pain, no right upper quadrant pain; it was all in the epigastrium. She has had no fevers or chills. She has had nausea and vomiting, had a normal bowel movement today and last night. She denies jaundice or acholic stools. No melena or hematochezia. She has had a history of diverticulosis in the past with bleeding noted at colonoscopy.  She has never had an EGD.   Her pain scale when it started was a 4. It  crescendoed at 10 and is now a 7.  She is nauseated but has not vomited in several hours.   PAST MEDICAL HISTORY:  1. Untreated hypertension. Apparently she had trouble tolerating multiple different medications and is not treated for hypertension at this time.  2. She has had shingles in the past.   PAST SURGICAL HISTORY: Scalp cyst.   FAMILY HISTORY: Noncontributory. No history of gallstones.   ALLERGIES: Benicar and oxycodone.   MEDICATIONS: See list.   SOCIAL HISTORY: The patient does not smoke or drink. She is a Agricultural engineer.   REVIEW OF SYSTEMS: 10-system review has been performed and negative with the exception of that mentioned in the history of present illness.   PHYSICAL EXAMINATION:  GENERAL: Morbidly obese female patient.  BMI 42.6. 280 pounds, 68 inches tall.   VITAL  SIGNS: Temperature 92, pulse 66, respirations 22, blood pressure 215/99, 98% room air sat.   HEENT: No scleral icterus.   NECK: No palpable neck nodes.   CHEST: Clear to auscultation.   CARDIAC: Regular rate and rhythm.   ABDOMEN: Soft, nondistended, nontender. There is no Murphy's sign. No peritoneal irritation. No guarding, no distention, and an essentially negative exam.  She points to the epigastrium and on pushing in that area and in the right upper quadrant there is essentially no tenderness.   EXTREMITIES: Moderate edema. Calves are nontender.   NEUROLOGIC: Grossly intact.   INTEGUMENT: No jaundice.   LABORATORY, RADIOLOGICAL, AND DIAGNOSTIC DATA: Ultrasound shows gallstones with no thickening and a normal common bile duct.   Lipase 43, serum protein is 8.7. Liver function tests are within normal limits. Glucose slightly elevated at 128. White blood cell count 10.2. Hemoglobin and hematocrit of 12.9 and 38.9 with a platelet count 232.   ASSESSMENT AND PLAN: This is a patient with possible biliary colic. She has had multiple episodes with this. Her nausea is being better controlled at this point, as is her pain. I see no sign of acute cholecystitis and in fact this may be peptic ulcer disease and not biliary colic in that she has no right upper quadrant pain and no tenderness.   My suggestion as discussed with Dr. Thomasene Lot was that this patient be sent home on H2-blockers or proton pump inhibitors as well as  antinausea medications and analgesics, to follow up in my office next week. The only other adjunct to testing would be a HIDA scan, which cannot usually be obtained on the weekend. If this persists she may need an EGD and GI consultation. This was discussed with Dr. Thomasene Lot. I will be happy to follow the patient if she is admitted to the hospital.     ____________________________ Jerrol Banana. Burt Knack, MD rec:bjt D: 05/26/2011 06:12:10 ET T: 05/26/2011 10:45:17  ET JOB#: [id number]  cc: Jerrol Banana. Burt Knack, MD, <Dictator> Florene Glen MD ELECTRONICALLY SIGNED 05/26/2011 20:05

## 2014-08-01 NOTE — Consult Note (Signed)
Brief Consult Note: Diagnosis: epigastric pain.   Patient was seen by consultant.   Consult note dictated.   Recommend further assessment or treatment.   Discussed with Attending MD.   Comments: known gallstones but no clinical evidence of acute chole. possible biliary colic vs PUD. disposition disc with Dr Thomasene Lot.  Electronic Signatures: Florene Glen (MD)  (Signed 16-Feb-13 06:05)  Authored: Brief Consult Note   Last Updated: 16-Feb-13 06:05 by Florene Glen (MD)

## 2014-10-14 ENCOUNTER — Encounter: Payer: Self-pay | Admitting: Internal Medicine

## 2015-01-17 ENCOUNTER — Ambulatory Visit: Payer: Self-pay | Admitting: Internal Medicine

## 2015-01-17 ENCOUNTER — Encounter: Payer: Self-pay | Admitting: Internal Medicine

## 2015-01-17 DIAGNOSIS — M25549 Pain in joints of unspecified hand: Secondary | ICD-10-CM | POA: Insufficient documentation

## 2015-01-17 DIAGNOSIS — I10 Essential (primary) hypertension: Secondary | ICD-10-CM | POA: Insufficient documentation

## 2015-01-17 DIAGNOSIS — H04129 Dry eye syndrome of unspecified lacrimal gland: Secondary | ICD-10-CM | POA: Insufficient documentation

## 2015-01-19 ENCOUNTER — Encounter: Payer: Self-pay | Admitting: Internal Medicine

## 2015-01-19 ENCOUNTER — Other Ambulatory Visit
Admission: RE | Admit: 2015-01-19 | Discharge: 2015-01-19 | Disposition: A | Discharge status: Home / Self Care | Payer: BLUE CROSS/BLUE SHIELD | Source: Ambulatory Visit | Attending: Internal Medicine | Admitting: Internal Medicine

## 2015-01-19 ENCOUNTER — Ambulatory Visit (INDEPENDENT_AMBULATORY_CARE_PROVIDER_SITE_OTHER): Payer: BLUE CROSS/BLUE SHIELD | Admitting: Internal Medicine

## 2015-01-19 VITALS — BP 138/80 | HR 72 | Ht 67.5 in | Wt 281.8 lb

## 2015-01-19 DIAGNOSIS — R739 Hyperglycemia, unspecified: Secondary | ICD-10-CM | POA: Insufficient documentation

## 2015-01-19 DIAGNOSIS — Z23 Encounter for immunization: Secondary | ICD-10-CM

## 2015-01-19 DIAGNOSIS — E559 Vitamin D deficiency, unspecified: Secondary | ICD-10-CM | POA: Insufficient documentation

## 2015-01-19 DIAGNOSIS — R7309 Other abnormal glucose: Secondary | ICD-10-CM

## 2015-01-19 DIAGNOSIS — Z1239 Encounter for other screening for malignant neoplasm of breast: Secondary | ICD-10-CM

## 2015-01-19 DIAGNOSIS — Z0000 Encounter for general adult medical examination without abnormal findings: Secondary | ICD-10-CM | POA: Insufficient documentation

## 2015-01-19 LAB — CBC WITH DIFFERENTIAL/PLATELET
Basophils Absolute: 0.1 10*3/uL (ref 0–0.1)
Basophils Relative: 1 %
Eosinophils Absolute: 0.1 10*3/uL (ref 0–0.7)
Eosinophils Relative: 2 %
HCT: 41.4 % (ref 35.0–47.0)
Hemoglobin: 13.9 g/dL (ref 12.0–16.0)
Lymphocytes Relative: 33 %
Lymphs Abs: 2.2 10*3/uL (ref 1.0–3.6)
MCH: 30 pg (ref 26.0–34.0)
MCHC: 33.5 g/dL (ref 32.0–36.0)
MCV: 89.6 fL (ref 80.0–100.0)
Monocytes Absolute: 0.5 10*3/uL (ref 0.2–0.9)
Monocytes Relative: 7 %
Neutro Abs: 3.7 10*3/uL (ref 1.4–6.5)
Neutrophils Relative %: 57 %
Platelets: 241 10*3/uL (ref 150–440)
RBC: 4.62 MIL/uL (ref 3.80–5.20)
RDW: 13.5 % (ref 11.5–14.5)
WBC: 6.6 10*3/uL (ref 3.6–11.0)

## 2015-01-19 LAB — COMPREHENSIVE METABOLIC PANEL
ALK PHOS: 75 U/L (ref 38–126)
ALT: 16 U/L (ref 14–54)
ANION GAP: 10 (ref 5–15)
AST: 20 U/L (ref 15–41)
Albumin: 4.7 g/dL (ref 3.5–5.0)
BILIRUBIN TOTAL: 0.5 mg/dL (ref 0.3–1.2)
BUN: 15 mg/dL (ref 6–20)
CALCIUM: 9.5 mg/dL (ref 8.9–10.3)
CO2: 26 mmol/L (ref 22–32)
CREATININE: 0.97 mg/dL (ref 0.44–1.00)
Chloride: 102 mmol/L (ref 101–111)
GFR calc non Af Amer: >60 mL/min (ref >60)
Glucose, Bld: 91 mg/dL (ref 65–99)
Potassium: 3.6 mmol/L (ref 3.5–5.1)
SODIUM: 138 mmol/L (ref 135–145)
TOTAL PROTEIN: 8.9 g/dL — AB (ref 6.5–8.1)

## 2015-01-19 LAB — POCT URINALYSIS DIPSTICK
BILIRUBIN UA: NEGATIVE
Blood, UA: NEGATIVE
GLUCOSE UA: NEGATIVE
Ketones, UA: NEGATIVE
Leukocytes, UA: NEGATIVE
NITRITE UA: NEGATIVE
Protein, UA: NEGATIVE
Spec Grav, UA: 1.01
UROBILINOGEN UA: 0.2
pH, UA: 5

## 2015-01-19 NOTE — Patient Instructions (Signed)
DASH Eating Plan  DASH stands for "Dietary Approaches to Stop Hypertension." The DASH eating plan is a healthy eating plan that has been shown to reduce high blood pressure (hypertension). Additional health benefits may include reducing the risk of type 2 diabetes mellitus, heart disease, and stroke. The DASH eating plan may also help with weight loss.  WHAT DO I NEED TO KNOW ABOUT THE DASH EATING PLAN?  For the DASH eating plan, you will follow these general guidelines:  · Choose foods with a percent daily value for sodium of less than 5% (as listed on the food label).  · Use salt-free seasonings or herbs instead of table salt or sea salt.  · Check with your health care provider or pharmacist before using salt substitutes.  · Eat lower-sodium products, often labeled as "lower sodium" or "no salt added."  · Eat fresh foods.  · Eat more vegetables, fruits, and low-fat dairy products.  · Choose whole grains. Look for the word "whole" as the first word in the ingredient list.  · Choose fish and skinless chicken or turkey more often than red meat. Limit fish, poultry, and meat to 6 oz (170 g) each day.  · Limit sweets, desserts, sugars, and sugary drinks.  · Choose heart-healthy fats.  · Limit cheese to 1 oz (28 g) per day.  · Eat more home-cooked food and less restaurant, buffet, and fast food.  · Limit fried foods.  · Cook foods using methods other than frying.  · Limit canned vegetables. If you do use them, rinse them well to decrease the sodium.  · When eating at a restaurant, ask that your food be prepared with less salt, or no salt if possible.  WHAT FOODS CAN I EAT?  Seek help from a dietitian for individual calorie needs.  Grains  Whole grain or whole wheat bread. Brown rice. Whole grain or whole wheat pasta. Quinoa, bulgur, and whole grain cereals. Low-sodium cereals. Corn or whole wheat flour tortillas. Whole grain cornbread. Whole grain crackers. Low-sodium crackers.  Vegetables  Fresh or frozen vegetables  (raw, steamed, roasted, or grilled). Low-sodium or reduced-sodium tomato and vegetable juices. Low-sodium or reduced-sodium tomato sauce and paste. Low-sodium or reduced-sodium canned vegetables.   Fruits  All fresh, canned (in natural juice), or frozen fruits.  Meat and Other Protein Products  Ground beef (85% or leaner), grass-fed beef, or beef trimmed of fat. Skinless chicken or turkey. Ground chicken or turkey. Pork trimmed of fat. All fish and seafood. Eggs. Dried beans, peas, or lentils. Unsalted nuts and seeds. Unsalted canned beans.  Dairy  Low-fat dairy products, such as skim or 1% milk, 2% or reduced-fat cheeses, low-fat ricotta or cottage cheese, or plain low-fat yogurt. Low-sodium or reduced-sodium cheeses.  Fats and Oils  Tub margarines without trans fats. Light or reduced-fat mayonnaise and salad dressings (reduced sodium). Avocado. Safflower, olive, or canola oils. Natural peanut or almond butter.  Other  Unsalted popcorn and pretzels.  The items listed above may not be a complete list of recommended foods or beverages. Contact your dietitian for more options.  WHAT FOODS ARE NOT RECOMMENDED?  Grains  White bread. White pasta. White rice. Refined cornbread. Bagels and croissants. Crackers that contain trans fat.  Vegetables  Creamed or fried vegetables. Vegetables in a cheese sauce. Regular canned vegetables. Regular canned tomato sauce and paste. Regular tomato and vegetable juices.  Fruits  Dried fruits. Canned fruit in light or heavy syrup. Fruit juice.  Meat and Other Protein   Products  Fatty cuts of meat. Ribs, chicken wings, bacon, sausage, bologna, salami, chitterlings, fatback, hot dogs, bratwurst, and packaged luncheon meats. Salted nuts and seeds. Canned beans with salt.  Dairy  Whole or 2% milk, cream, half-and-half, and cream cheese. Whole-fat or sweetened yogurt. Full-fat cheeses or blue cheese. Nondairy creamers and whipped toppings. Processed cheese, cheese spreads, or cheese  curds.  Condiments  Onion and garlic salt, seasoned salt, table salt, and sea salt. Canned and packaged gravies. Worcestershire sauce. Tartar sauce. Barbecue sauce. Teriyaki sauce. Soy sauce, including reduced sodium. Steak sauce. Fish sauce. Oyster sauce. Cocktail sauce. Horseradish. Ketchup and mustard. Meat flavorings and tenderizers. Bouillon cubes. Hot sauce. Tabasco sauce. Marinades. Taco seasonings. Relishes.  Fats and Oils  Butter, stick margarine, lard, shortening, ghee, and bacon fat. Coconut, palm kernel, or palm oils. Regular salad dressings.  Other  Pickles and olives. Salted popcorn and pretzels.  The items listed above may not be a complete list of foods and beverages to avoid. Contact your dietitian for more information.  WHERE CAN I FIND MORE INFORMATION?  National Heart, Lung, and Blood Institute: www.nhlbi.nih.gov/health/health-topics/topics/dash/     This information is not intended to replace advice given to you by your health care provider. Make sure you discuss any questions you have with your health care provider.     Document Released: 03/15/2011 Document Revised: 04/16/2014 Document Reviewed: 01/28/2013  Elsevier Interactive Patient Education ©2016 Elsevier Inc[employer]

## 2015-01-19 NOTE — Progress Notes (Signed)
Date:  01/19/2015   Name:  Courtney Robinson   DOB:  Aug 19, 1955   MRN:  [id number]   Chief Complaint: Annual Exam Courtney Mar Edythe Riches is a 59 y.o. female who presents today for her Complete Annual Exam. She feels well. She reports exercising walking regularly. She reports she is sleeping fairly well. Mammogram was done earlier this year.  Pap was done in March and was normal.  Pelvic US and biopsy done in April. She is now looking for another gynecologist.  Elevated A1C - patient had A1c is 6.0 done at her gynecologist office. Been trying to work with a healthier diet and less carbohydrates but has not been able to lose weight.   Low Vitamin D level - vitamin D level was very low at 17. She began taking vitamin D 50,000 units twice a week but ran out of prescription several weeks ago. She did not begin a daily supplement thereafter.   Knee pain -mild right knee pain noted intermittently. Seen by orthopedics and x-rays were done which showed preserved joint space and cartilage. She denies any change in symptoms without redness swelling or fluid accumulation.  Review of Systems  Constitutional: Negative for fever, chills and fatigue.  HENT: Negative for hearing loss.   Eyes: Negative for visual disturbance.  Respiratory: Negative for cough, chest tightness and shortness of breath.   Cardiovascular: Negative for chest pain, palpitations and leg swelling.  Gastrointestinal: Negative for nausea, abdominal pain and constipation.  Genitourinary: Negative for dysuria, frequency, hematuria and vaginal pain.  Musculoskeletal: Positive for arthralgias. Negative for gait problem.  Skin: Negative for color change and rash.  Neurological: Negative for weakness, light-headedness and headaches.  Hematological: Negative for adenopathy. Does not bruise/bleed easily.  Psychiatric/Behavioral: Negative for sleep disturbance and dysphoric mood.    Patient Active Problem List   Diagnosis Date Noted   . Essential (primary) hypertension 01/17/2015  . Dry eye syndrome 01/17/2015  . Arthralgia of hand 01/17/2015  . Gonalgia 06/15/2014  . VASCULITIS 01/30/2010  . ELECTROCARDIOGRAM, ABNORMAL 01/17/2010  . VITAMIN D DEFICIENCY 01/11/2010  . OBESITY 12/30/2009  . [place], MILD 03/25/2009  . REACTIVE AIRWAY DISEASE 06/30/2008  . HERNIATED DISC 06/25/2008  . SPINAL STENOSIS, LUMBAR 06/25/2008  . ANEMIA 06/15/2008  . ALLERGIC RHINITIS 06/15/2008    Prior to Admission medications   Medication Sig Start Date End Date Taking? Authorizing Provider  ergocalciferol (VITAMIN D2) 50000 UNITS capsule Take 50,000 Units by mouth 2 (two) times a week. For 8 weeks    Yes Historical Provider, MD  Gymnema Sylvestris Leaf POWD by Does not apply route.   Yes Historical Provider, MD    Allergies  Allergen Reactions  . Amlodipine   . Codeine Nausea And Vomiting    Other reaction(s): Nausea  . Hydrocodone   . Metoprolol   . Loratadine Palpitations  . Olmesartan Rash    Past Surgical History  Procedure Laterality Date  . 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 Cholecystectomy  2013    Social History  Substance Use Topics  . Smoking status: Never Smoker   . Smokeless tobacco: None  . Alcohol Use: No    Medication list has been reviewed and updated.   Physical Exam  Constitutional: She is oriented to person, place, and time. She appears well-developed and well-nourished. No distress.  HENT:  Head: Normocephalic and atraumatic.  Right Ear: Tympanic membrane and ear canal normal.  Left Ear: Tympanic membrane and ear canal normal.  Nose: Right sinus exhibits no maxillary sinus tenderness. Left sinus exhibits no maxillary sinus tenderness.  Mouth/Throat: Uvula is midline and oropharynx is clear and moist.  Eyes: Conjunctivae and EOM are normal. Right eye exhibits no discharge. Left eye exhibits no  discharge. No scleral icterus.  Neck: Normal range of motion. Carotid bruit is not present. No erythema present. No thyromegaly present.  Cardiovascular: Normal rate, regular rhythm, normal heart sounds and normal pulses.   Pulmonary/Chest: Effort normal and breath sounds normal. No respiratory distress. She has no wheezes. Right breast exhibits no mass, no nipple discharge, no skin change and no tenderness. Left breast exhibits no mass, no nipple discharge, no skin change and no tenderness.  Abdominal: Soft. Bowel sounds are normal. There is no hepatosplenomegaly. There is no tenderness. There is no CVA tenderness.  Musculoskeletal: Normal range of motion. She exhibits no edema or tenderness.  Lymphadenopathy:    She has no cervical adenopathy.    She has no axillary adenopathy.  Neurological: She is alert and oriented to person, place, and time. She has normal reflexes. No cranial nerve deficit or sensory deficit.  Skin: Skin is warm, dry and intact. No rash noted.  Psychiatric: She has a normal mood and affect. Her speech is normal and behavior is normal. Thought content normal.  Nursing note and vitals reviewed.   BP 138/80 mmHg  Pulse 72  Ht 5' 7.5" (1.715 m)  Wt 281 lb 12.8 oz (127.824 kg)  BMI 43.46 kg/m2  Assessment and Plan: 1. Annual physical exam Normal exam except for weight Patient encouraged to exercise and work on diet - POCT urinalysis dipstick - CBC with Differential/Platelet  2. Flu vaccine need - Flu Vaccine QUAD 36+ mos PF IM (Fluarix & Fluzone Quad PF)  3. Vitamin D deficiency We will check labs; begin 1000 international units daily of vitamin D - Vitamin D 1,25 dihydroxy  4. Elevated blood sugar Continue efforts at weight loss and diet - Comprehensive metabolic panel - Hemoglobin A1c  5. Breast cancer screening - MM DIGITAL SCREENING BILATERAL; Future   Halina Maidens, MD [facility]  01/19/2015

## 2015-01-20 LAB — HEMOGLOBIN A1C: HEMOGLOBIN A1C: 5.9 % (ref 4.0–6.0)

## 2015-01-22 LAB — VITAMIN D 1,25 DIHYDROXY
VITAMIN D 1, 25 (OH) TOTAL: 68 pg/mL
VITAMIN D3 1, 25 (OH): 68 pg/mL
Vitamin D2 1, 25 (OH)2: <10 pg/mL

## 2015-02-16 ENCOUNTER — Encounter: Payer: Self-pay | Admitting: Internal Medicine

## 2015-02-16 ENCOUNTER — Ambulatory Visit (INDEPENDENT_AMBULATORY_CARE_PROVIDER_SITE_OTHER): Payer: BLUE CROSS/BLUE SHIELD | Admitting: Internal Medicine

## 2015-02-16 VITALS — HR 76 | Temp 97.6°F | Ht 67.5 in | Wt 286.0 lb

## 2015-02-16 DIAGNOSIS — B029 Zoster without complications: Secondary | ICD-10-CM | POA: Diagnosis not present

## 2015-02-16 MED ORDER — VALACYCLOVIR HCL 1 G PO TABS: 1000.0000 mg | ORAL_TABLET | Freq: Three times a day (TID) | ORAL | Status: DC

## 2015-02-16 NOTE — Progress Notes (Signed)
Date:  02/16/2015   Name:  Courtney Robinson   DOB:  15-Mar-1956   MRN:  [id number]   Chief Complaint: Rash  Patient has a history of shingles about 8 years ago. It was a fairly severe cases required antivirals and prednisone. Yesterday she developed several similar lesions under her chin, near her right shoulder, and on her left upper arm. These lesions have not progressed but are very painful rather than pruritic. She feels ill as if she's beginning to get the flu. She's had no fever.   Review of Systems  Constitutional: Positive for chills and fatigue. Negative for fever.  Respiratory: Negative for shortness of breath.   Cardiovascular: Negative for chest pain.  Musculoskeletal: Negative for joint swelling and arthralgias.  Skin: Positive for rash.  Neurological: Negative for dizziness and headaches.  Hematological: Negative for adenopathy.    Patient Active Problem List   Diagnosis Date Noted  . Essential (primary) hypertension 01/17/2015  . Arthralgia of hand 01/17/2015  . Gonalgia 06/15/2014  . Vitamin D deficiency 01/11/2010  . Adult BMI 40.0-44.9 kg/sq m ([place]) 12/30/2009  . Allergic rhinitis 06/15/2008    Prior to Admission medications   Medication Sig Start Date End Date Taking? Authorizing Provider  Gymnema Sylvestris Leaf POWD by Does not apply route.   Yes Historical Provider, MD    Allergies  Allergen Reactions  . Amlodipine   . Codeine Nausea And Vomiting    Other reaction(s): Nausea  . Hydrocodone   . Metoprolol   . Loratadine Palpitations  . Olmesartan Rash    Past Surgical History  Procedure Laterality Date  . 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 Cholecystectomy  2013    Social History  Substance Use Topics  . Smoking status: Never Smoker   . Smokeless tobacco: None  . Alcohol Use: No    Medication list has been reviewed and updated.   Physical Exam    Constitutional: She appears well-developed.  Cardiovascular: Normal rate, regular rhythm and normal heart sounds.   Pulmonary/Chest: Effort normal and breath sounds normal.  Skin: Rash noted. Rash is papular and vesicular. There is erythema.     Nursing note and vitals reviewed.   Pulse 76  Temp(Src) 97.6 F (36.4 C)  Ht 5' 7.5" (1.715 m)  Wt 286 lb (129.729 kg)  BMI 44.11 kg/m2  Assessment and Plan: 1. Shingles Atypical distribution but typical appearance and symptoms Treat empirically Ibuprofen as needed for discomfort - valACYclovir (VALTREX) 1000 MG tablet; Take 1 tablet (1,000 mg total) by mouth 3 (three) times daily.  Dispense: 21 tablet; Refill: 0   Halina Maidens, MD [facility]  02/16/2015

## 2015-04-26 ENCOUNTER — Encounter: Payer: Self-pay | Admitting: Internal Medicine

## 2015-06-08 ENCOUNTER — Encounter: Payer: Self-pay | Admitting: Internal Medicine

## 2015-06-08 ENCOUNTER — Ambulatory Visit (INDEPENDENT_AMBULATORY_CARE_PROVIDER_SITE_OTHER): Payer: BLUE CROSS/BLUE SHIELD | Admitting: Internal Medicine

## 2015-06-08 VITALS — BP 140/82 | HR 97 | Temp 98.2°F | Resp 16 | Ht 68.0 in | Wt 286.6 lb

## 2015-06-08 DIAGNOSIS — M67472 Ganglion, left ankle and foot: Secondary | ICD-10-CM

## 2015-06-08 NOTE — Progress Notes (Signed)
    Date:  06/08/2015   Name:  Courtney Robinson   DOB:  08-21-1955   MRN:  [id number]   Chief Complaint: Foot Injury Noticed a swelling of the left foot yesterday.  It is minimally uncomfortable. She denies any injury although she has been working in her garden. To a ganglion that she had on the side of her foot many years ago. Generally wears flat shoes but did go hiking on an irregular surface wearing only tennis shoes several weeks ago.   Review of Systems  Constitutional: Negative for fever, chills, diaphoresis and fatigue.  Respiratory: Negative for cough, chest tightness and shortness of breath.   Cardiovascular: Negative for chest pain and palpitations.  Musculoskeletal: Positive for arthralgias.  Skin: Negative for color change, rash and wound.    Patient Active Problem List   Diagnosis Date Noted  . Essential (primary) hypertension 01/17/2015  . Arthralgia of hand 01/17/2015  . Gonalgia 06/15/2014  . Vitamin D deficiency 01/11/2010  . Adult BMI 40.0-44.9 kg/sq m ([place]) 12/30/2009  . Allergic rhinitis 06/15/2008    Prior to Admission medications   Medication Sig Start Date End Date Taking? Authorizing Provider  Gymnema Sylvestris Leaf POWD by Does not apply route.   Yes Historical Provider, MD  Misc Natural Products (ADRENAL PO) Take by mouth.   Yes Historical Provider, MD    Allergies  Allergen Reactions  . Amlodipine   . Codeine Nausea And Vomiting    Other reaction(s): Nausea  . Hydrocodone   . Metoprolol   . Loratadine Palpitations  . Olmesartan Rash    Past Surgical History  Procedure Laterality Date  . 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 Cholecystectomy  2013    Social History  Substance Use Topics  . Smoking status: Never Smoker   . Smokeless tobacco: None  . Alcohol Use: No     Medication list has been reviewed and updated.   Physical Exam    Constitutional: She is oriented to person, place, and time. She appears well-developed and well-nourished. No distress.  HENT:  Head: Normocephalic and atraumatic.  Cardiovascular: Normal rate, regular rhythm and normal heart sounds.   Pulmonary/Chest: Effort normal and breath sounds normal. No respiratory distress.  Musculoskeletal: Normal range of motion.  1 cm ganglion on dorsum of left foot just distal to the ankle.  Ganglion is soft and non tender.  Neurological: She is alert and oriented to person, place, and time.  Skin: Skin is warm and dry. No rash noted.  Psychiatric: She has a normal mood and affect. Her behavior is normal. Thought content normal.    BP 140/82 mmHg  Pulse 97  Temp(Src) 98.2 F (36.8 C) (Oral)  Resp 16  Ht 5\' 8"  (1.727 m)  Wt 286 lb 9.6 oz (130.001 kg)  BMI 43.59 kg/m2  SpO2 97%  Assessment and Plan: 1. Ganglion cyst of left foot Supportive care No indication for surgery at this time   Halina Maidens, MD [facility]  06/08/2015

## 2015-07-27 ENCOUNTER — Emergency Department: Payer: BLUE CROSS/BLUE SHIELD

## 2015-07-27 DIAGNOSIS — Z5321 Procedure and treatment not carried out due to patient leaving prior to being seen by health care provider: Secondary | ICD-10-CM | POA: Insufficient documentation

## 2015-07-27 DIAGNOSIS — R002 Palpitations: Secondary | ICD-10-CM | POA: Diagnosis present

## 2015-07-27 LAB — BASIC METABOLIC PANEL
ANION GAP: 9 (ref 5–15)
BUN: 18 mg/dL (ref 6–20)
CALCIUM: 10 mg/dL (ref 8.9–10.3)
CO2: 26 mmol/L (ref 22–32)
CREATININE: 1.12 mg/dL — AB (ref 0.44–1.00)
Chloride: 103 mmol/L (ref 101–111)
GFR, EST NON AFRICAN AMERICAN: 52 mL/min — AB (ref >60)
GLUCOSE: 110 mg/dL — AB (ref 65–99)
Potassium: 3.9 mmol/L (ref 3.5–5.1)
Sodium: 138 mmol/L (ref 135–145)

## 2015-07-27 LAB — CBC
HCT: 41 % (ref 35.0–47.0)
HEMOGLOBIN: 13.8 g/dL (ref 12.0–16.0)
MCH: 29.8 pg (ref 26.0–34.0)
MCHC: 33.6 g/dL (ref 32.0–36.0)
MCV: 88.6 fL (ref 80.0–100.0)
PLATELETS: 248 10*3/uL (ref 150–440)
RBC: 4.63 MIL/uL (ref 3.80–5.20)
RDW: 13.4 % (ref 11.5–14.5)
WBC: 9.8 10*3/uL (ref 3.6–11.0)

## 2015-07-27 LAB — TROPONIN I

## 2015-07-27 NOTE — ED Notes (Signed)
Pt to triage via w/c with no distress noted; reports PTA sudden onset SHOB and heart racing; denies hx of same; denies pain

## 2015-07-28 ENCOUNTER — Emergency Department
Admission: EM | Admit: 2015-07-28 | Discharge: 2015-07-28 | Disposition: A | Discharge status: Home / Self Care | Payer: BLUE CROSS/BLUE SHIELD | Attending: Internal Medicine | Admitting: Internal Medicine

## 2015-07-29 ENCOUNTER — Telehealth: Payer: Self-pay | Admitting: Emergency Medicine

## 2015-07-29 NOTE — ED Notes (Signed)
Called patient due to lwot to inquire about condition and follow up plans. Left message.   

## 2015-08-12 ENCOUNTER — Encounter: Payer: Self-pay | Admitting: Internal Medicine

## 2015-08-12 ENCOUNTER — Ambulatory Visit (INDEPENDENT_AMBULATORY_CARE_PROVIDER_SITE_OTHER): Payer: BLUE CROSS/BLUE SHIELD | Admitting: Internal Medicine

## 2015-08-12 VITALS — BP 124/80 | HR 70 | Temp 99.0°F | Ht 68.0 in | Wt 286.0 lb

## 2015-08-12 DIAGNOSIS — J018 Other acute sinusitis: Secondary | ICD-10-CM

## 2015-08-12 DIAGNOSIS — M25562 Pain in left knee: Secondary | ICD-10-CM | POA: Diagnosis not present

## 2015-08-12 DIAGNOSIS — Z87898 Personal history of other specified conditions: Secondary | ICD-10-CM | POA: Insufficient documentation

## 2015-08-12 DIAGNOSIS — Z8679 Personal history of other diseases of the circulatory system: Secondary | ICD-10-CM

## 2015-08-12 MED ORDER — AZITHROMYCIN 250 MG PO TABS: ORAL_TABLET | ORAL | Status: DC

## 2015-08-12 NOTE — Progress Notes (Signed)
Date:  08/12/2015   Name:  Courtney Robinson   DOB:  1956/02/21   MRN:  [id number]   Chief Complaint: Sinusitis and Joint Swelling Sinusitis This is a new problem. The current episode started in the past 7 days. The maximum temperature recorded prior to her arrival was 100.4 - 100.9 F. Associated symptoms include chills, ear pain, headaches, shortness of breath, sinus pressure, a sore throat and swollen glands. Pertinent negatives include no coughing.  Knee Pain  The incident occurred more than 1 week ago. The incident occurred at home (after doing some gardening). There was no injury mechanism. The pain is present in the left knee. The quality of the pain is described as aching. The pain is mild. The pain has been improving since onset. Pertinent negatives include no numbness. Associated symptoms comments: Felt tight behind the knee but no swelling noted.  Palpitations - seen in the ER at [facility] 2 weeks ago with palpitations and chest discomfort.  She was emotionally upset which triggered her symptoms.  By the time she arrived in the ER they had resolved.  Troponins were negative.  EKG was unremarkable.  Symptoms have not recurred.   Review of Systems  Constitutional: Positive for chills. Negative for fever and fatigue.  HENT: Positive for ear pain, postnasal drip, sinus pressure and sore throat. Negative for tinnitus and trouble swallowing.   Respiratory: Positive for shortness of breath. Negative for cough, chest tightness and wheezing.   Cardiovascular: Positive for palpitations. Negative for chest pain and leg swelling.  Musculoskeletal: Positive for arthralgias (left knee).  Neurological: Positive for headaches. Negative for dizziness, syncope and numbness.    Patient Active Problem List   Diagnosis Date Noted  . Left knee pain 08/12/2015  . Essential (primary) hypertension 01/17/2015  . Arthralgia of hand 01/17/2015  . Vitamin D deficiency 01/11/2010  . Adult BMI  40.0-44.9 kg/sq m ([place]) 12/30/2009  . Allergic rhinitis 06/15/2008    Prior to Admission medications   Medication Sig Start Date End Date Taking? Authorizing Provider  Gymnema Sylvestris Leaf POWD by Does not apply route.   Yes Historical Provider, MD  Misc Natural Products (ADRENAL PO) Take by mouth.   Yes Historical Provider, MD    Allergies  Allergen Reactions  . Amlodipine   . Codeine Nausea And Vomiting    Other reaction(s): Nausea  . Hydrocodone   . Metoprolol   . Loratadine Palpitations  . Olmesartan Rash    Past Surgical History  Procedure Laterality Date  . 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 Cholecystectomy  2013    Social History  Substance Use Topics  . Smoking status: Never Smoker   . Smokeless tobacco: None  . Alcohol Use: No    Medication list has been reviewed and updated.   Physical Exam  Constitutional: She is oriented to person, place, and time. She appears well-developed and well-nourished.  HENT:  Right Ear: External ear and ear canal normal. Tympanic membrane is not erythematous and not retracted.  Left Ear: External ear and ear canal normal. Tympanic membrane is not erythematous and not retracted.  Nose: Right sinus exhibits maxillary sinus tenderness and frontal sinus tenderness. Left sinus exhibits maxillary sinus tenderness and frontal sinus tenderness.  Mouth/Throat: Uvula is midline and mucous membranes are normal. No oral lesions. Posterior oropharyngeal erythema present. No oropharyngeal exudate.  Cardiovascular: Normal rate, regular rhythm and normal heart  sounds.   Pulmonary/Chest: Breath sounds normal. She has no decreased breath sounds. She has no wheezes. She has no rales.  Musculoskeletal:       Left knee: She exhibits normal range of motion (mild crepitus), no swelling and no effusion. Tenderness (mild posterior discomfort) found.  Lymphadenopathy:      She has no cervical adenopathy.  Neurological: She is alert and oriented to person, place, and time.  Nursing note and vitals reviewed.   BP 124/80 mmHg  Pulse 70  Temp(Src) 99 F (37.2 C)  Ht 5\' 8"  (1.727 m)  Wt 286 lb (129.729 kg)  BMI 43.50 kg/m2  SpO2 98%  Assessment and Plan: 1. Other acute sinusitis Take advil for headache and fever Increase fluids - azithromycin (ZITHROMAX Z-PAK) 250 MG tablet; Take 2 tabs on day #1 then 1 tab daily for 4 days  Dispense: 6 each; Refill: 0  2. Left knee pain Mild strain - patient reassured  3. History of palpitations No worrisome characteristics Return to ER if recurrent  Halina Maidens, MD [facility]  08/12/2015

## 2015-08-18 ENCOUNTER — Ambulatory Visit (INDEPENDENT_AMBULATORY_CARE_PROVIDER_SITE_OTHER): Payer: BLUE CROSS/BLUE SHIELD | Admitting: Internal Medicine

## 2015-08-18 ENCOUNTER — Encounter: Payer: Self-pay | Admitting: Internal Medicine

## 2015-08-18 VITALS — BP 142/78 | HR 76 | Temp 98.4°F | Resp 14 | Ht 68.0 in | Wt 286.0 lb

## 2015-08-18 DIAGNOSIS — J309 Allergic rhinitis, unspecified: Secondary | ICD-10-CM | POA: Diagnosis not present

## 2015-08-18 DIAGNOSIS — J0101 Acute recurrent maxillary sinusitis: Secondary | ICD-10-CM | POA: Diagnosis not present

## 2015-08-18 MED ORDER — LEVOFLOXACIN 500 MG PO TABS: 500.0000 mg | ORAL_TABLET | Freq: Every day | ORAL | Status: DC

## 2015-08-18 MED ORDER — MONTELUKAST SODIUM 10 MG PO TABS: 10.0000 mg | ORAL_TABLET | Freq: Every day | ORAL | Status: DC

## 2015-08-18 MED ORDER — GUAIFENESIN-CODEINE 100-10 MG/5ML PO SYRP: 5.0000 mL | ORAL_SOLUTION | Freq: Three times a day (TID) | ORAL | Status: DC | PRN

## 2015-08-18 NOTE — Progress Notes (Signed)
Date:  08/18/2015   Name:  Courtney Robinson   DOB:  09-29-1955   MRN:  [id number]   Chief Complaint: Cough Cough This is a new problem. The current episode started 1 to 4 weeks ago. The problem has been unchanged. The problem occurs every few minutes. The cough is non-productive. Associated symptoms include nasal congestion, postnasal drip, a sore throat, shortness of breath and wheezing. Pertinent negatives include no chest pain, chills, fever, headaches or sweats. Exacerbated by: and recent sinus infection. Her past medical history is significant for environmental allergies. There is no history of asthma or emphysema.      Review of Systems  Constitutional: Negative for fever, chills and fatigue.  HENT: Positive for congestion, postnasal drip, sinus pressure and sore throat. Negative for tinnitus and trouble swallowing.   Respiratory: Positive for cough, shortness of breath and wheezing.   Cardiovascular: Negative for chest pain.  Allergic/Immunologic: Positive for environmental allergies.  Neurological: Negative for dizziness, syncope and headaches.    Patient Active Problem List   Diagnosis Date Noted  . Left knee pain 08/12/2015  . History of palpitations 08/12/2015  . Essential (primary) hypertension 01/17/2015  . Arthralgia of hand 01/17/2015  . Vitamin D deficiency 01/11/2010  . Adult BMI 40.0-44.9 kg/sq m ([place]) 12/30/2009  . Allergic rhinitis 06/15/2008    Prior to Admission medications   Medication Sig Start Date End Date Taking? Authorizing Provider  fluticasone (FLONASE) 50 MCG/ACT nasal spray Place into both nostrils daily.   Yes Historical Provider, MD  ibuprofen (ADVIL,MOTRIN) 200 MG tablet Take 200 mg by mouth every 6 (six) hours as needed.   Yes Historical Provider, MD  Gymnema Sylvestris Leaf POWD by Does not apply route. Reported on 08/18/2015    Historical Provider, MD  Misc Natural Products (ADRENAL PO) Take by mouth. Reported on 08/18/2015     Historical Provider, MD    Allergies  Allergen Reactions  . Amlodipine   . Codeine Nausea And Vomiting    Other reaction(s): Nausea  . Hydrocodone   . Metoprolol   . Loratadine Palpitations  . Olmesartan Rash    Past Surgical History  Procedure Laterality Date  . 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 Cholecystectomy  2013    Social History  Substance Use Topics  . Smoking status: Never Smoker   . Smokeless tobacco: None  . Alcohol Use: No     Medication list has been reviewed and updated.   Physical Exam  Constitutional: She is oriented to person, place, and time. She appears well-developed and well-nourished.  HENT:  Right Ear: External ear and ear canal normal. Tympanic membrane is not erythematous and not retracted.  Left Ear: External ear and ear canal normal. Tympanic membrane is not erythematous and not retracted.  Nose: Right sinus exhibits maxillary sinus tenderness and frontal sinus tenderness. Left sinus exhibits maxillary sinus tenderness and frontal sinus tenderness.  Mouth/Throat: Uvula is midline and mucous membranes are normal. No oral lesions. Posterior oropharyngeal erythema present. No oropharyngeal exudate.  Cardiovascular: Normal rate, regular rhythm and normal heart sounds.   Pulmonary/Chest: Effort normal. No respiratory distress. She has wheezes in the right lower field and the left lower field. She has no rales.  Lymphadenopathy:    She has no cervical adenopathy.  Neurological: She is alert and oriented to person, place, and time.  Nursing note and vitals reviewed.   BP 142/78 mmHg  Pulse 76  Temp(Src) 98.4 F (36.9 C) (Oral)  Resp 14  Ht 5\' 8"  (1.727 m)  Wt 286 lb (129.729 kg)  BMI 43.50 kg/m2  SpO2 98%  Assessment and Plan: 1. Acute recurrent maxillary sinusitis With early bronchitis Sample dulera 100/4.5 given - levofloxacin (LEVAQUIN) 500 MG  tablet; Take 1 tablet (500 mg total) by mouth daily.  Dispense: 10 tablet; Refill: 0 - guaiFENesin-codeine (ROBITUSSIN AC) 100-10 MG/5ML syrup; Take 5 mLs by mouth 3 (three) times daily as needed for cough.  Dispense: 150 mL; Refill: 0  2. Allergic rhinitis, unspecified allergic rhinitis type Begin singulair in addition to Flonase - montelukast (SINGULAIR) 10 MG tablet; Take 1 tablet (10 mg total) by mouth at bedtime.  Dispense: 30 tablet; Refill: Inez, MD [facility]  08/18/2015

## 2015-09-26 ENCOUNTER — Telehealth: Payer: Self-pay

## 2015-09-26 ENCOUNTER — Other Ambulatory Visit: Payer: Self-pay | Admitting: Internal Medicine

## 2015-09-26 DIAGNOSIS — J0101 Acute recurrent maxillary sinusitis: Secondary | ICD-10-CM

## 2015-09-26 DIAGNOSIS — J309 Allergic rhinitis, unspecified: Secondary | ICD-10-CM

## 2015-09-26 MED ORDER — GUAIFENESIN-CODEINE 100-10 MG/5ML PO SYRP: 5.0000 mL | ORAL_SOLUTION | Freq: Three times a day (TID) | ORAL | Status: DC | PRN

## 2015-09-26 MED ORDER — MONTELUKAST SODIUM 10 MG PO TABS: 10.0000 mg | ORAL_TABLET | Freq: Every day | ORAL | Status: DC

## 2015-09-26 NOTE — Telephone Encounter (Signed)
Patient requesting call in meds: Singulair and Cough Rx with Codeine. Just finished Abx but states allergies causing issues. Advised check w pharmacy if I do not call by lunch time.

## 2016-01-19 ENCOUNTER — Ambulatory Visit: Payer: BLUE CROSS/BLUE SHIELD

## 2016-01-20 ENCOUNTER — Encounter: Payer: BLUE CROSS/BLUE SHIELD | Admitting: Internal Medicine

## 2016-03-19 ENCOUNTER — Other Ambulatory Visit: Payer: Self-pay | Admitting: *Deleted

## 2016-03-19 ENCOUNTER — Ambulatory Visit (INDEPENDENT_AMBULATORY_CARE_PROVIDER_SITE_OTHER): Payer: Self-pay | Admitting: Internal Medicine

## 2016-03-19 VITALS — BP 142/68 | HR 64 | Temp 98.6°F | Ht 68.0 in

## 2016-03-19 DIAGNOSIS — J0100 Acute maxillary sinusitis, unspecified: Secondary | ICD-10-CM

## 2016-03-19 MED ORDER — CEFDINIR 300 MG PO CAPS: 300.0000 mg | ORAL_CAPSULE | Freq: Two times a day (BID) | ORAL | 0 refills | Status: DC

## 2016-03-19 NOTE — Progress Notes (Signed)
    Date:  03/19/2016   Name:  Courtney Robinson   DOB:  [birth year]   MRN:  [id number]   Chief Complaint: Jaw Pain (Pt stated having jaw,neck, left side of the face,sinus 5 days.) She thought she had a dental problem and had xrays and exam this am.  The exam was cleared and Dentist thought it was sinus.   Review of Systems  Constitutional: Positive for fatigue. Negative for chills.  HENT: Positive for congestion, postnasal drip and rhinorrhea.   Respiratory: Positive for cough and wheezing. Negative for choking and shortness of breath.   Cardiovascular: Negative for chest pain, palpitations and leg swelling.  Musculoskeletal: Positive for myalgias and neck pain.  Hematological: Positive for adenopathy.    Patient Active Problem List   Diagnosis Date Noted  . Left knee pain 08/12/2015  . History of palpitations 08/12/2015  . Essential (primary) hypertension 01/17/2015  . Arthralgia of hand 01/17/2015  . Vitamin D deficiency 01/11/2010  . Adult BMI 40.0-44.9 kg/sq m ([place]) 12/30/2009  . Allergic rhinitis 06/15/2008    Prior to Admission medications   Medication Sig Start Date End Date Taking? Authorizing Provider  ibuprofen (ADVIL,MOTRIN) 200 MG tablet Take 200 mg by mouth every 6 (six) hours as needed.   Yes Historical Provider, MD    Allergies  Allergen Reactions  . Amlodipine   . Codeine Nausea And Vomiting    Other reaction(s): Nausea  . Hydrocodone   . Metoprolol   . Loratadine Palpitations  . Olmesartan Rash    Past Surgical History:  Procedure Laterality Date  . CHOLECYSTECTOMY  2013  . 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 Smoking status: Never Smoker  . Smokeless tobacco: Not on file  . Alcohol use No     Medication list has been reviewed and updated.   Physical Exam  Constitutional: She is oriented to person, place, and  time. She appears well-developed and well-nourished.  HENT:  Right Ear: External ear and ear canal normal. Tympanic membrane is erythematous and retracted.  Left Ear: External ear and ear canal normal. Tympanic membrane is erythematous and retracted.  Nose: Right sinus exhibits maxillary sinus tenderness and frontal sinus tenderness. Left sinus exhibits no maxillary sinus tenderness and no frontal sinus tenderness.  Mouth/Throat: Uvula is midline and mucous membranes are normal. No oral lesions. Posterior oropharyngeal erythema present. No oropharyngeal exudate.  Neck: Normal range of motion. Neck supple. No thyromegaly present.  Cardiovascular: Normal rate, regular rhythm and normal heart sounds.   Pulmonary/Chest: Effort normal and breath sounds normal. She has no wheezes. She has no rales.  Lymphadenopathy:    She has cervical adenopathy.       Right cervical: Posterior cervical adenopathy present.  Neurological: She is alert and oriented to person, place, and time.  Nursing note and vitals reviewed.   BP (!) 142/68   Pulse 64   Temp 98.6 F (37 C)   Ht 5\' 8"  (1.727 m)   SpO2 98%   Assessment and Plan: 1. Acute non-recurrent maxillary sinusitis Begin Flonase NS and continue Advil for LAN and facial pressure - cefdinir (OMNICEF) 300 MG capsule; Take 1 capsule (300 mg total) by mouth 2 (two) times daily.  Dispense: 20 capsule; Refill: 0   Halina Maidens, MD [facility]  03/19/2016

## 2016-03-19 NOTE — Patient Instructions (Signed)
Flonase and Ibuprofen

## 2016-07-10 ENCOUNTER — Ambulatory Visit
Admission: RE | Admit: 2016-07-10 | Discharge: 2016-07-10 | Disposition: A | Discharge status: Home / Self Care | Payer: PRIVATE HEALTH INSURANCE | Source: Ambulatory Visit | Attending: Internal Medicine | Admitting: Internal Medicine

## 2016-07-10 ENCOUNTER — Other Ambulatory Visit: Payer: Self-pay | Admitting: Internal Medicine

## 2016-07-10 ENCOUNTER — Ambulatory Visit (INDEPENDENT_AMBULATORY_CARE_PROVIDER_SITE_OTHER): Payer: PRIVATE HEALTH INSURANCE | Admitting: Internal Medicine

## 2016-07-10 ENCOUNTER — Encounter: Payer: Self-pay | Admitting: Internal Medicine

## 2016-07-10 VITALS — BP 154/88 | HR 72 | Ht 68.0 in | Wt 295.0 lb

## 2016-07-10 DIAGNOSIS — Z124 Encounter for screening for malignant neoplasm of cervix: Secondary | ICD-10-CM

## 2016-07-10 DIAGNOSIS — Z87898 Personal history of other specified conditions: Secondary | ICD-10-CM

## 2016-07-10 DIAGNOSIS — I10 Essential (primary) hypertension: Secondary | ICD-10-CM

## 2016-07-10 DIAGNOSIS — Z0000 Encounter for general adult medical examination without abnormal findings: Secondary | ICD-10-CM

## 2016-07-10 DIAGNOSIS — G245 Blepharospasm: Secondary | ICD-10-CM | POA: Diagnosis not present

## 2016-07-10 DIAGNOSIS — Z1231 Encounter for screening mammogram for malignant neoplasm of breast: Secondary | ICD-10-CM | POA: Insufficient documentation

## 2016-07-10 LAB — POCT URINALYSIS DIPSTICK
Bilirubin, UA: NEGATIVE
Blood, UA: NEGATIVE
Glucose, UA: NEGATIVE
Ketones, UA: NEGATIVE
NITRITE UA: NEGATIVE
PH UA: 6.5 (ref 5.0–8.0)
PROTEIN UA: NEGATIVE
Spec Grav, UA: 1.005 (ref 1.030–1.035)
Urobilinogen, UA: 0.2 (ref ?–2.0)

## 2016-07-10 MED ORDER — LISINOPRIL 20 MG PO TABS: 20.0000 mg | ORAL_TABLET | Freq: Every day | ORAL | 1 refills | Status: DC

## 2016-07-10 NOTE — Progress Notes (Signed)
Date:  07/10/2016   Name:  Courtney Robinson   DOB:  12/13/1955   MRN:  [id number]   Chief Complaint: Annual Exam (Breast / pap exam) and eye twitching (Left eye twitching for last 2 months. Taking Magnesium and helping but not stoppng. ) Courtney Robinson is a 61 y.o. female who presents today for her Complete Annual Exam. She feels fairly well. She reports exercising walking and dance class. She reports she is sleeping well.  She is due for Pap and mammogram.  Hypertension  This is a chronic problem. The problem is unchanged. The problem is uncontrolled. Pertinent negatives include no chest pain, headaches, palpitations or shortness of breath.  She has been intolerant to beta blockers and ARBs.  She has never taken an ACEI or diuretic.   Review of Systems  Constitutional: Negative for chills, fatigue and fever.  HENT: Negative for congestion, hearing loss, tinnitus, trouble swallowing and voice change.   Eyes: Negative for visual disturbance.  Respiratory: Negative for cough, chest tightness, shortness of breath and wheezing.   Cardiovascular: Negative for chest pain, palpitations and leg swelling.  Gastrointestinal: Negative for abdominal pain, constipation, diarrhea and vomiting.  Endocrine: Negative for polydipsia and polyuria.  Genitourinary: Negative for dysuria, frequency, genital sores, vaginal bleeding and vaginal discharge.  Musculoskeletal: Positive for myalgias (left lower leg and ganglion on left foot). Negative for arthralgias, gait problem and joint swelling.  Skin: Negative for color change and rash.  Neurological: Positive for tremors (blepharospasm). Negative for dizziness, light-headedness and headaches.  Hematological: Negative for adenopathy. Does not bruise/bleed easily.  Psychiatric/Behavioral: Negative for dysphoric mood and sleep disturbance. The patient is not nervous/anxious.     Patient Active Problem List   Diagnosis Date Noted  . Left  knee pain 08/12/2015  . History of palpitations 08/12/2015  . Essential (primary) hypertension 01/17/2015  . Arthralgia of hand 01/17/2015  . Adult BMI 40.0-44.9 kg/sq m ([place]) 12/30/2009  . Allergic rhinitis 06/15/2008    Prior to Admission medications   Medication Sig Start Date End Date Taking? Authorizing Provider  DHA-EPA-Vit B6-B12-Folic Acid (CARDIOVID PLUS) CAPS Take 6 capsules by mouth 2 (two) times daily at 8 am and 10 pm.   Yes Historical Provider, MD  Gymnema Sylvestris Leaf POWD 1 tablet by Does not apply route daily.   Yes Historical Provider, MD  ibuprofen (ADVIL,MOTRIN) 200 MG tablet Take 200 mg by mouth every 6 (six) hours as needed.   Yes Historical Provider, MD  Magnesium Citrate 125 MG CAPS Take 1 tablet by mouth daily.   Yes Historical Provider, MD    Allergies  Allergen Reactions  . Amlodipine   . Codeine Nausea And Vomiting    Other reaction(s): Nausea  . Hydrocodone   . Metoprolol   . Loratadine Palpitations  . Olmesartan Rash    Past Surgical History:  Procedure Laterality Date  . CHOLECYSTECTOMY  2013  . 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 Smoking status: Never Smoker  . Smokeless tobacco: Never Used  . Alcohol use No    Medication list has been reviewed and updated.   Physical Exam  Constitutional: She is oriented to person, place, and time. She appears well-developed and well-nourished. No distress.  HENT:  Head: Normocephalic and atraumatic.  Right Ear: Tympanic membrane and ear canal normal.  Left Ear: Tympanic membrane and ear canal  normal.  Nose: Right sinus exhibits no maxillary sinus tenderness. Left sinus exhibits no maxillary sinus tenderness.  Mouth/Throat: Uvula is midline and oropharynx is clear and moist.  Eyes: Conjunctivae and EOM are normal. Right eye exhibits no discharge. Left eye exhibits no  discharge. No scleral icterus.  Neck: Normal range of motion. Carotid bruit is not present. No erythema present. No thyromegaly present.  Cardiovascular: Normal rate, regular rhythm, normal heart sounds and normal pulses.   Pulmonary/Chest: Effort normal. No respiratory distress. She has no wheezes. She has no rhonchi. Right breast exhibits no mass, no nipple discharge, no skin change and no tenderness. Left breast exhibits no mass, no nipple discharge, no skin change and no tenderness.  Abdominal: Soft. Bowel sounds are normal. There is no hepatosplenomegaly. There is no tenderness. There is no CVA tenderness.  Genitourinary: Vagina normal and uterus normal. There is no tenderness, lesion or injury on the right labia. There is no tenderness, lesion or injury on the left labia. Cervix exhibits discharge (slighty mucus discharge). Cervix exhibits no motion tenderness and no friability. Right adnexum displays no mass, no tenderness and no fullness. Left adnexum displays no mass, no tenderness and no fullness.  Musculoskeletal: She exhibits no edema or tenderness.  Lymphadenopathy:    She has no cervical adenopathy.    She has no axillary adenopathy.  Neurological: She is alert and oriented to person, place, and time. She has normal reflexes. No cranial nerve deficit or sensory deficit.  Skin: Skin is warm, dry and intact. No rash noted.  Psychiatric: She has a normal mood and affect. Her speech is normal and behavior is normal. Thought content normal.  Nursing note and vitals reviewed.   BP (!) 154/88 (BP Location: Right Arm, Patient Position: Sitting, Cuff Size: Large)   Pulse 72   Ht 5\' 8"  (1.727 m)   Wt 295 lb (133.8 kg)   SpO2 98%   BMI 44.85 kg/m   Assessment and Plan: 1. Annual physical exam Encouraged exercise, diet and weight loss - Lipid panel - POCT urinalysis dipstick  2. Essential (primary) hypertension Need to begin medication  - CBC with Differential/Platelet -  Comprehensive metabolic panel  3. History of palpitations stable - Comprehensive metabolic panel - TSH  4. Encounter for Papanicolaou smear for cervical cancer screening - MM SCREENING BREAST TOMO BILATERAL; Future  5. Encounter for screening mammogram for breast cancer - Pap IG and HPV (high risk) DNA detection  6. Blepharospasm Continue Mg+, reduce stress, eye exam   Meds ordered this encounter  Medications  . lisinopril (PRINIVIL,ZESTRIL) 20 MG tablet    Sig: Take 1 tablet (20 mg total) by mouth daily.    Dispense:  30 tablet    Refill:  Krupp, MD [facility]  07/10/2016

## 2016-07-10 NOTE — Patient Instructions (Signed)
Breast Self-Awareness Breast self-awareness means being familiar with how your breasts look and feel. It involves checking your breasts regularly and reporting any changes to your health care provider. Practicing breast self-awareness is important. A change in your breasts can be a sign of a serious medical problem. Being familiar with how your breasts look and feel allows you to find any problems early, when treatment is more likely to be successful. All women should practice breast self-awareness, including women who have had breast implants. How to do a breast self-exam One way to learn what is normal for your breasts and whether your breasts are changing is to do a breast self-exam. To do a breast self-exam: Look for Changes   1. Remove all the clothing above your waist. 2. Stand in front of a mirror in a room with good lighting. 3. Put your hands on your hips. 4. Push your hands firmly downward. 5. Compare your breasts in the mirror. Look for differences between them (asymmetry), such as:  Differences in shape.  Differences in size.  Puckers, dips, and bumps in one breast and not the other. 6. Look at each breast for changes in your skin, such as:  Redness.  Scaly areas. 7. Look for changes in your nipples, such as:  Discharge.  Bleeding.  Dimpling.  Redness.  A change in position. Feel for Changes   Carefully feel your breasts for lumps and changes. It is best to do this while lying on your back on the floor and again while sitting or standing in the shower or tub with soapy water on your skin. Feel each breast in the following way:  Place the arm on the side of the breast you are examining above your head.  Feel your breast with the other hand.  Start in the nipple area and make  inch (2 cm) overlapping circles to feel your breast. Use the pads of your three middle fingers to do this. Apply light pressure, then medium pressure, then firm pressure. The light pressure  will allow you to feel the tissue closest to the skin. The medium pressure will allow you to feel the tissue that is a little deeper. The firm pressure will allow you to feel the tissue close to the ribs.  Continue the overlapping circles, moving downward over the breast until you feel your ribs below your breast.  Move one finger-width toward the center of the body. Continue to use the  inch (2 cm) overlapping circles to feel your breast as you move slowly up toward your collarbone.  Continue the up and down exam using all three pressures until you reach your armpit. Write Down What You Find   Write down what is normal for each breast and any changes that you find. Keep a written record with breast changes or normal findings for each breast. By writing this information down, you do not need to depend only on memory for size, tenderness, or location. Write down where you are in your menstrual cycle, if you are still menstruating. If you are having trouble noticing differences in your breasts, do not get discouraged. With time you will become more familiar with the variations in your breasts and more comfortable with the exam. How often should I examine my breasts? Examine your breasts every month. If you are breastfeeding, the best time to examine your breasts is after a feeding or after using a breast pump. If you menstruate, the best time to examine your breasts is 5-7 days  after your period is over. During your period, your breasts are lumpier, and it may be more difficult to notice changes. When should I see my health care provider? See your health care provider if you notice:  A change in shape or size of your breasts or nipples.  A change in the skin of your breast or nipples, such as a reddened or scaly area.  Unusual discharge from your nipples.  A lump or thick area that was not there before.  Pain in your breasts.  Anything that concerns you. This information is not intended to  replace advice given to you by your health care provider. Make sure you discuss any questions you have with your health care provider. Document Released: 03/26/2005 Document Revised: 09/01/2015 Document Reviewed: 02/13/2015 Elsevier Interactive Patient Education  2017 [employer].

## 2016-07-11 LAB — COMPREHENSIVE METABOLIC PANEL
A/G RATIO: 1.3 (ref 1.2–2.2)
ALK PHOS: 81 IU/L (ref 39–117)
ALT: 11 IU/L (ref 0–32)
AST: 18 IU/L (ref 0–40)
Albumin: 4.3 g/dL (ref 3.6–4.8)
BILIRUBIN TOTAL: 0.3 mg/dL (ref 0.0–1.2)
BUN/Creatinine Ratio: 13 (ref 12–28)
BUN: 13 mg/dL (ref 8–27)
CHLORIDE: 102 mmol/L (ref 96–106)
CO2: 23 mmol/L (ref 18–29)
Calcium: 9.7 mg/dL (ref 8.7–10.3)
Creatinine, Ser: 0.99 mg/dL (ref 0.57–1.00)
GFR calc Af Amer: 72 mL/min/{1.73_m2} (ref >59)
GFR calc non Af Amer: 62 mL/min/{1.73_m2} (ref >59)
Globulin, Total: 3.4 g/dL (ref 1.5–4.5)
Glucose: 100 mg/dL — ABNORMAL HIGH (ref 65–99)
POTASSIUM: 4.3 mmol/L (ref 3.5–5.2)
Sodium: 142 mmol/L (ref 134–144)
Total Protein: 7.7 g/dL (ref 6.0–8.5)

## 2016-07-11 LAB — LIPID PANEL
CHOLESTEROL TOTAL: 207 mg/dL — AB (ref 100–199)
Chol/HDL Ratio: 3.4 ratio (ref 0.0–4.4)
HDL: 61 mg/dL (ref >39)
LDL Calculated: 118 mg/dL — ABNORMAL HIGH (ref 0–99)
Triglycerides: 140 mg/dL (ref 0–149)
VLDL Cholesterol Cal: 28 mg/dL (ref 5–40)

## 2016-07-11 LAB — CBC WITH DIFFERENTIAL/PLATELET
BASOS ABS: 0 10*3/uL (ref 0.0–0.2)
BASOS: 0 %
EOS (ABSOLUTE): 0.1 10*3/uL (ref 0.0–0.4)
Eos: 2 %
Hematocrit: 41 % (ref 34.0–46.6)
Hemoglobin: 13.6 g/dL (ref 11.1–15.9)
Immature Grans (Abs): 0 10*3/uL (ref 0.0–0.1)
Immature Granulocytes: 0 %
Lymphocytes Absolute: 2.3 10*3/uL (ref 0.7–3.1)
Lymphs: 33 %
MCH: 29.3 pg (ref 26.6–33.0)
MCHC: 33.2 g/dL (ref 31.5–35.7)
MCV: 88 fL (ref 79–97)
MONOS ABS: 0.6 10*3/uL (ref 0.1–0.9)
Monocytes: 9 %
NEUTROS ABS: 3.9 10*3/uL (ref 1.4–7.0)
NEUTROS PCT: 56 %
PLATELETS: 266 10*3/uL (ref 150–379)
RBC: 4.64 x10E6/uL (ref 3.77–5.28)
RDW: 13.8 % (ref 12.3–15.4)
WBC: 7 10*3/uL (ref 3.4–10.8)

## 2016-07-11 LAB — TSH: TSH: 2.19 u[IU]/mL (ref 0.450–4.500)

## 2016-07-13 LAB — PAP IG AND HPV HIGH-RISK
HPV, high-risk: NEGATIVE
PAP SMEAR COMMENT: 0

## 2016-07-18 ENCOUNTER — Telehealth: Payer: Self-pay | Admitting: *Deleted

## 2016-07-18 ENCOUNTER — Other Ambulatory Visit: Payer: Self-pay | Admitting: Internal Medicine

## 2016-07-18 MED ORDER — ALBUTEROL SULFATE HFA 108 (90 BASE) MCG/ACT IN AERS: 2.0000 | INHALATION_SPRAY | Freq: Four times a day (QID) | RESPIRATORY_TRACT | 0 refills | Status: DC | PRN

## 2016-07-18 NOTE — Telephone Encounter (Signed)
Pt called requesting Breo inhaler to be sent into pharmacy ([facility] in [place]) for her flare up before wheezing begins to get worse.

## 2016-07-18 NOTE — Telephone Encounter (Signed)
Patient called and states she left a message for the nurse regarding wanting a prescription for an inhaler Breo . She states she gets this inhaler when her allergies starts  to flare up. She is requesting a call back. Her number [phone].  [facility] in [place] is her pharmacy if Dr. Army Melia will prescribe this inhaler without an appt. Thank you

## 2016-07-18 NOTE — Telephone Encounter (Signed)
Memory Dance is for COPD or Asthma maintenance therapy - not for use as needed.  It sounds like she needs an albuterol inhaler instead.

## 2016-07-18 NOTE — Telephone Encounter (Signed)
Pt agree's to Albuterol inhaler. Send to [facility] in [place].

## 2016-07-20 ENCOUNTER — Ambulatory Visit (INDEPENDENT_AMBULATORY_CARE_PROVIDER_SITE_OTHER): Payer: PRIVATE HEALTH INSURANCE | Admitting: Internal Medicine

## 2016-07-20 ENCOUNTER — Other Ambulatory Visit: Payer: Self-pay | Admitting: Internal Medicine

## 2016-07-20 ENCOUNTER — Encounter: Payer: Self-pay | Admitting: Internal Medicine

## 2016-07-20 VITALS — BP 130/74 | HR 66 | Temp 98.2°F | Ht 68.0 in | Wt 298.0 lb

## 2016-07-20 DIAGNOSIS — I10 Essential (primary) hypertension: Secondary | ICD-10-CM

## 2016-07-20 DIAGNOSIS — J3089 Other allergic rhinitis: Secondary | ICD-10-CM | POA: Diagnosis not present

## 2016-07-20 MED ORDER — GUAIFENESIN-CODEINE 100-10 MG/5ML PO SYRP: 5.0000 mL | ORAL_SOLUTION | Freq: Three times a day (TID) | ORAL | 0 refills | Status: DC | PRN

## 2016-07-20 MED ORDER — ALCAFTADINE 0.25 % OP SOLN: 1.0000 [drp] | Freq: Every day | OPHTHALMIC | 3 refills | Status: DC

## 2016-07-20 MED ORDER — OLOPATADINE HCL 0.1 % OP SOLN: 1.0000 [drp] | Freq: Two times a day (BID) | OPHTHALMIC | 12 refills | Status: DC

## 2016-07-20 MED ORDER — MONTELUKAST SODIUM 10 MG PO TABS: 10.0000 mg | ORAL_TABLET | Freq: Every day | ORAL | 5 refills | Status: DC

## 2016-07-20 NOTE — Progress Notes (Signed)
Date:  07/20/2016   Name:  Courtney Robinson   DOB:  06-Apr-1956   MRN:  [id number]   Chief Complaint: Cough (due to allergies. Cough is worse at night. A lot of clear mucous. Drinking lots of fluids. Congestion.) Cough  This is a recurrent problem. The problem occurs every few minutes. The cough is non-productive. Associated symptoms include postnasal drip, rhinorrhea and wheezing (occasional mild sx). Pertinent negatives include no chest pain, chills, fever or headaches. The symptoms are aggravated by lying down and pollens. She has tried a beta-agonist inhaler for the symptoms. The treatment provided mild relief.  She severe seasonal allergies.  Watery sandy eyes, sneezing, sinus pressure, post nasal drip and dry cough.  She is using some homeopathic herbal remedies as well as flonase. She did not tolerate claritin or zyrtec.  She took singulair at one time but does not remember if she had a side effect.    Review of Systems  Constitutional: Negative for chills, fatigue and fever.  HENT: Positive for congestion, postnasal drip, rhinorrhea, sinus pressure and sneezing.   Eyes: Positive for discharge and itching.  Respiratory: Positive for cough and wheezing (occasional mild sx). Negative for chest tightness.   Cardiovascular: Negative for chest pain and palpitations.  Neurological: Negative for dizziness and headaches.    Patient Active Problem List   Diagnosis Date Noted  . 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 Left knee pain 08/12/2015  . History of palpitations 08/12/2015  . Essential (primary) hypertension 01/17/2015  . Arthralgia of hand 01/17/2015  . Adult BMI 40.0-44.9 kg/sq m ([place]) 12/30/2009  . Allergic rhinitis 06/15/2008    Prior to Admission medications   Medication Sig Start Date End Date Taking? Authorizing Provider  albuterol (PROVENTIL HFA;VENTOLIN HFA) 108 (90 Base) MCG/ACT inhaler Inhale 2 puffs into the lungs every 6 (six) hours as needed for wheezing or  shortness of breath. 07/18/16  Yes Glean Hess, MD  DHA-EPA-Vit B6-B12-Folic Acid (CARDIOVID PLUS) CAPS Take 6 capsules by mouth 2 (two) times daily at 8 am and 10 pm.   Yes Historical Provider, MD  Gymnema Sylvestris Leaf POWD 1 tablet by Does not apply route daily.   Yes Historical Provider, MD  ibuprofen (ADVIL,MOTRIN) 200 MG tablet Take 200 mg by mouth every 6 (six) hours as needed.   Yes Historical Provider, MD  lisinopril (PRINIVIL,ZESTRIL) 20 MG tablet Take 1 tablet (20 mg total) by mouth daily. 07/10/16  Yes Glean Hess, MD  Magnesium Citrate 125 MG CAPS Take 1 tablet by mouth daily.   Yes Historical Provider, MD    Allergies  Allergen Reactions  . Amlodipine   . Codeine Nausea And Vomiting    Other reaction(s): Nausea  . Hydrocodone   . Metoprolol   . Loratadine Palpitations  . Olmesartan Rash    Past Surgical History:  Procedure Laterality Date  . CHOLECYSTECTOMY  2013  . 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 Smoking status: Never Smoker  . Smokeless tobacco: Never Used  . Alcohol use No     Medication list has been reviewed and updated.   Physical Exam  Constitutional: She is oriented to person, place, and time. She appears well-developed. No distress.  HENT:  Head: Normocephalic and atraumatic.  Right Ear: Tympanic membrane and ear canal normal.  Left Ear: Tympanic membrane and ear canal normal.  Nose: Right sinus exhibits  maxillary sinus tenderness. Left sinus exhibits maxillary sinus tenderness.  Mouth/Throat: No posterior oropharyngeal erythema.  Eyes: EOM are normal. Pupils are equal, round, and reactive to light. Right eye exhibits chemosis. Left eye exhibits chemosis.  Cardiovascular: Normal rate and regular rhythm.   Pulmonary/Chest: Effort normal and breath sounds normal. No respiratory distress. She has no wheezes. She has  no rhonchi.  Musculoskeletal: Normal range of motion.  Neurological: She is alert and oriented to person, place, and time.  Skin: Skin is warm and dry. No rash noted.  Psychiatric: She has a normal mood and affect. Her behavior is normal. Thought content normal.  Nursing note and vitals reviewed.   BP 130/74   Pulse 66   Temp 98.2 F (36.8 C)   Ht 5\' 8"  (1.727 m)   Wt 298 lb (135.2 kg)   SpO2 97%   BMI 45.31 kg/m   Assessment and Plan: 1. Environmental and seasonal allergies Severe sx need to be addressed through multiple mechanisms Continue Flonase Samples of Xyzal given - guaiFENesin-codeine (ROBITUSSIN AC) 100-10 MG/5ML syrup; Take 5 mLs by mouth 3 (three) times daily as needed for cough.  Dispense: 150 mL; Refill: 0 - olopatadine (PATANOL) 0.1 % ophthalmic solution; Place 1 drop into both eyes 2 (two) times daily.  Dispense: 5 mL; Refill: 12 - montelukast (SINGULAIR) 10 MG tablet; Take 1 tablet (10 mg total) by mouth at bedtime.  Dispense: 30 tablet; Refill: 5  2. Essential (primary) hypertension Improving on lisinopril   Meds ordered this encounter  Medications  . guaiFENesin-codeine (ROBITUSSIN AC) 100-10 MG/5ML syrup    Sig: Take 5 mLs by mouth 3 (three) times daily as needed for cough.    Dispense:  150 mL    Refill:  0  . olopatadine (PATANOL) 0.1 % ophthalmic solution    Sig: Place 1 drop into both eyes 2 (two) times daily.    Dispense:  5 mL    Refill:  12  . montelukast (SINGULAIR) 10 MG tablet    Sig: Take 1 tablet (10 mg total) by mouth at bedtime.    Dispense:  30 tablet    Refill:  [place], MD [facility]  07/20/2016

## 2016-08-09 ENCOUNTER — Encounter: Payer: Self-pay | Admitting: Internal Medicine

## 2016-08-09 ENCOUNTER — Ambulatory Visit (INDEPENDENT_AMBULATORY_CARE_PROVIDER_SITE_OTHER): Payer: PRIVATE HEALTH INSURANCE | Admitting: Internal Medicine

## 2016-08-09 VITALS — BP 126/68 | HR 76 | Temp 98.1°F | Ht 68.0 in | Wt 298.4 lb

## 2016-08-09 DIAGNOSIS — J3089 Other allergic rhinitis: Secondary | ICD-10-CM | POA: Diagnosis not present

## 2016-08-09 DIAGNOSIS — R059 Cough, unspecified: Secondary | ICD-10-CM

## 2016-08-09 DIAGNOSIS — R05 Cough: Secondary | ICD-10-CM

## 2016-08-09 MED ORDER — MONTELUKAST SODIUM 10 MG PO TABS: 10.0000 mg | ORAL_TABLET | Freq: Every day | ORAL | 5 refills | Status: DC

## 2016-08-09 MED ORDER — DOXYCYCLINE HYCLATE 100 MG PO TABS: 100.0000 mg | ORAL_TABLET | Freq: Two times a day (BID) | ORAL | 0 refills | Status: DC

## 2016-08-09 MED ORDER — GUAIFENESIN-CODEINE 100-10 MG/5ML PO SYRP: 5.0000 mL | ORAL_SOLUTION | Freq: Three times a day (TID) | ORAL | 0 refills | Status: DC | PRN

## 2016-08-09 NOTE — Progress Notes (Signed)
Date:  08/09/2016   Name:  Courtney Robinson   DOB:  11-23-1955   MRN:  [id number]   Chief Complaint: Cough (Feels like not going away and not getting any better. Itchy throat, watery eyes, clear mucous, and congestion. No fever noticed. Feels SOB today.  ) Cough  This is a chronic problem. The problem has been unchanged. The problem occurs every few minutes. The cough is productive of sputum. Associated symptoms include postnasal drip and shortness of breath. Pertinent negatives include no chest pain, chills, fever, headaches, sore throat or wheezing. The symptoms are aggravated by lying down. She has tried a beta-agonist inhaler and prescription cough suppressant for the symptoms. The treatment provided mild relief.      Review of Systems  Constitutional: Negative for chills and fever.  HENT: Positive for postnasal drip and sinus pressure. Negative for congestion, sore throat and trouble swallowing.   Eyes: Negative for visual disturbance.  Respiratory: Positive for cough and shortness of breath. Negative for wheezing.   Cardiovascular: Negative for chest pain.  Gastrointestinal: Negative for abdominal pain, diarrhea and vomiting.  Neurological: Negative for dizziness and headaches.  Hematological: Negative for adenopathy.    Patient Active Problem List   Diagnosis Date Noted  . Environmental and seasonal allergies 07/20/2016  . 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 Left knee pain 08/12/2015  . History of palpitations 08/12/2015  . Essential (primary) hypertension 01/17/2015  . Arthralgia of hand 01/17/2015  . Adult BMI 40.0-44.9 kg/sq m ([place]) 12/30/2009    Prior to Admission medications   Medication Sig Start Date End Date Taking? Authorizing Provider  Alcaftadine 0.25 % SOLN Apply 1 drop to eye daily. Each eye 07/20/16  Yes Glean Hess, MD  DHA-EPA-Vit B6-B12-Folic Acid (CARDIOVID PLUS) CAPS Take 6 capsules by mouth 2 (two) times daily at 8 am and 10 pm.   Yes Historical  Provider, MD  Gymnema Sylvestris Leaf POWD 1 tablet by Does not apply route daily.   Yes Historical Provider, MD  ibuprofen (ADVIL,MOTRIN) 200 MG tablet Take 200 mg by mouth every 6 (six) hours as needed.   Yes Historical Provider, MD  levocetirizine (XYZAL) 5 MG tablet Take 5 mg by mouth every evening.   Yes Historical Provider, MD  lisinopril (PRINIVIL,ZESTRIL) 20 MG tablet Take 1 tablet (20 mg total) by mouth daily. 07/10/16  Yes Glean Hess, MD  Magnesium Citrate 125 MG CAPS Take 1 tablet by mouth daily.   Yes Historical Provider, MD  VENTOLIN HFA 108 (90 Base) MCG/ACT inhaler Take 2 puffs by mouth as needed. 07/18/16   Historical Provider, MD    Allergies  Allergen Reactions  . Amlodipine   . Codeine Nausea And Vomiting    Other reaction(s): Nausea  . Hydrocodone   . Metoprolol   . Loratadine Palpitations  . Olmesartan Rash    Past Surgical History:  Procedure Laterality Date  . CHOLECYSTECTOMY  2013  . 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 Smoking status: Never Smoker  . Smokeless tobacco: Never Used  . Alcohol use No     Medication list has been reviewed and updated.   Physical Exam  Constitutional: She is oriented to person, place, and time. She appears well-developed. No distress.  HENT:  Head: Normocephalic and atraumatic.  Right Ear: Tympanic membrane and ear canal normal.  Left Ear: Tympanic membrane and ear canal  normal.  Nose: Right sinus exhibits maxillary sinus tenderness. Left sinus exhibits maxillary sinus tenderness.  Mouth/Throat: No posterior oropharyngeal erythema.  Eyes: EOM are normal. Pupils are equal, round, and reactive to light.  Cardiovascular: Normal rate and regular rhythm.   Pulmonary/Chest: Effort normal and breath sounds normal. No respiratory distress. She has no wheezes. She has no rhonchi.  Musculoskeletal:  Normal range of motion.  Neurological: She is alert and oriented to person, place, and time.  Skin: Skin is warm and dry. No rash noted.  Psychiatric: She has a normal mood and affect. Her behavior is normal. Thought content normal.  Nursing note and vitals reviewed.   BP 126/68 (BP Location: Right Arm, Patient Position: Sitting, Cuff Size: Large)   Pulse 76   Temp 98.1 F (36.7 C)   Ht 5\' 8"  (1.727 m)   Wt 298 lb 6.4 oz (135.4 kg)   SpO2 96%   BMI 45.37 kg/m   Assessment and Plan: 1. Environmental and seasonal allergies Continue current meds plus add singulair  - montelukast (SINGULAIR) 10 MG tablet; Take 1 tablet (10 mg total) by mouth at bedtime.  Dispense: 30 tablet; Refill: 5 - guaiFENesin-codeine (ROBITUSSIN AC) 100-10 MG/5ML syrup; Take 5 mLs by mouth 3 (three) times daily as needed for cough.  Dispense: 236 mL; Refill: 0  2. Cough Cover atypicals with doxycycline   Meds ordered this encounter  Medications  . doxycycline (VIBRA-TABS) 100 MG tablet    Sig: Take 1 tablet (100 mg total) by mouth 2 (two) times daily.    Dispense:  20 tablet    Refill:  0  . montelukast (SINGULAIR) 10 MG tablet    Sig: Take 1 tablet (10 mg total) by mouth at bedtime.    Dispense:  30 tablet    Refill:  5  . guaiFENesin-codeine (ROBITUSSIN AC) 100-10 MG/5ML syrup    Sig: Take 5 mLs by mouth 3 (three) times daily as needed for cough.    Dispense:  236 mL    Refill:  0    Halina Maidens, MD [facility]  08/09/2016

## 2016-08-09 NOTE — Patient Instructions (Signed)
Breo - one puff daily as needed

## 2016-08-22 ENCOUNTER — Ambulatory Visit (INDEPENDENT_AMBULATORY_CARE_PROVIDER_SITE_OTHER): Payer: PRIVATE HEALTH INSURANCE | Admitting: Internal Medicine

## 2016-08-22 ENCOUNTER — Ambulatory Visit: Payer: PRIVATE HEALTH INSURANCE | Admitting: Internal Medicine

## 2016-08-22 ENCOUNTER — Encounter: Payer: Self-pay | Admitting: Internal Medicine

## 2016-08-22 VITALS — BP 126/84 | HR 69 | Ht 68.0 in | Wt 298.0 lb

## 2016-08-22 DIAGNOSIS — I10 Essential (primary) hypertension: Secondary | ICD-10-CM

## 2016-08-22 DIAGNOSIS — J3089 Other allergic rhinitis: Secondary | ICD-10-CM | POA: Diagnosis not present

## 2016-08-22 MED ORDER — MONTELUKAST SODIUM 10 MG PO TABS: 10.0000 mg | ORAL_TABLET | Freq: Every day | ORAL | 5 refills | Status: DC

## 2016-08-22 MED ORDER — LISINOPRIL 20 MG PO TABS: 20.0000 mg | ORAL_TABLET | Freq: Every day | ORAL | 3 refills | Status: DC

## 2016-08-22 MED ORDER — GUAIFENESIN-CODEINE 100-10 MG/5ML PO SYRP: 5.0000 mL | ORAL_SOLUTION | Freq: Every evening | ORAL | 0 refills | Status: DC | PRN

## 2016-08-22 NOTE — Progress Notes (Signed)
Date:  08/22/2016   Name:  Courtney Robinson   DOB:  10/16/1955   MRN:  [id number]   Chief Complaint: Hypertension  Hypertension  This is a new problem. The problem has been gradually improving since onset. Pertinent negatives include no chest pain, palpitations or shortness of breath. Past treatments include ACE inhibitors. The current treatment provides significant improvement.    Review of Systems  Constitutional: Negative for chills, fatigue and fever.  Respiratory: Negative for cough, chest tightness, shortness of breath and wheezing.   Cardiovascular: Negative for chest pain and palpitations.    Patient Active Problem List   Diagnosis Date Noted  . Environmental and seasonal allergies 07/20/2016  . 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 Left knee pain 08/12/2015  . History of palpitations 08/12/2015  . Essential (primary) hypertension 01/17/2015  . Arthralgia of hand 01/17/2015  . Adult BMI 40.0-44.9 kg/sq m ([place]) 12/30/2009    Prior to Admission medications   Medication Sig Start Date End Date Taking? Authorizing Provider  Alcaftadine 0.25 % SOLN Apply 1 drop to eye daily. Each eye 07/20/16  Yes Glean Hess, MD  levocetirizine (XYZAL) 5 MG tablet Take 5 mg by mouth every evening.   Yes [provider]  lisinopril (PRINIVIL,ZESTRIL) 20 MG tablet Take 1 tablet (20 mg total) by mouth daily. 07/10/16  Yes Glean Hess, MD  montelukast (SINGULAIR) 10 MG tablet Take 1 tablet (10 mg total) by mouth at bedtime. 08/09/16  Yes Glean Hess, MD  VENTOLIN HFA 108 [phone] Base) MCG/ACT inhaler Take 2 puffs by mouth as needed. 07/18/16  Yes [provider]    Allergies  Allergen Reactions  . Amlodipine   . Codeine Nausea And Vomiting    Other reaction(s): Nausea  . Hydrocodone   . Metoprolol   . Loratadine Palpitations  . Olmesartan Rash    Past Surgical History:  Procedure Laterality Date  . CHOLECYSTECTOMY  2013  . 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 Smoking status: Never Smoker  . Smokeless tobacco: Never Used  . Alcohol use No     Medication list has been reviewed and updated.   Physical Exam  Constitutional: She is oriented to person, place, and time. She appears well-developed. No distress.  HENT:  Head: Normocephalic and atraumatic.  Neck: Normal range of motion. Neck supple. Carotid bruit is not present.  Cardiovascular: Normal rate, regular rhythm and normal heart sounds.   Pulmonary/Chest: Effort normal and breath sounds normal. No respiratory distress. She has no wheezes.  Musculoskeletal: Normal range of motion. She exhibits no edema.  Neurological: She is alert and oriented to person, place, and time.  Skin: Skin is warm and dry. No rash noted.  Psychiatric: She has a normal mood and affect. Her behavior is normal. Thought content normal.  Nursing note and vitals reviewed.   BP 126/84 (BP Location: Right Arm, Patient Position: Sitting, Cuff Size: Large)   Pulse 69   Ht 5\' 8"  (1.727 m)   Wt 298 lb (135.2 kg)   SpO2 97%   BMI 45.31 kg/m   Assessment and Plan: 1. Environmental and seasonal allergies - guaiFENesin-codeine (ROBITUSSIN AC) 100-10 MG/5ML syrup; Take 5 mLs by mouth at bedtime as needed for cough.  Dispense: 236 mL; Refill: 0 - montelukast (SINGULAIR) 10 MG tablet; Take 1 tablet (10 mg total) by mouth at bedtime.  Dispense:  30 tablet; Refill: 5  2. Essential (primary) hypertension Controlled; continue lisinopril   Meds ordered this encounter  Medications  . guaiFENesin-codeine (ROBITUSSIN AC) 100-10 MG/5ML syrup    Sig: Take 5 mLs by mouth at bedtime as needed for cough.    Dispense:  236 mL    Refill:  0  . lisinopril (PRINIVIL,ZESTRIL) 20 MG tablet    Sig: Take 1 tablet (20 mg total) by mouth daily.    Dispense:  90 tablet    Refill:  3  . DISCONTD: montelukast  (SINGULAIR) 10 MG tablet    Sig: Take 1 tablet (10 mg total) by mouth at bedtime.    Dispense:  30 tablet    Refill:  5  . montelukast (SINGULAIR) 10 MG tablet    Sig: Take 1 tablet (10 mg total) by mouth at bedtime.    Dispense:  30 tablet    Refill:  [place], MD [facility]  08/22/2016

## 2016-08-30 ENCOUNTER — Encounter: Payer: Self-pay | Admitting: Family Medicine

## 2016-08-30 ENCOUNTER — Ambulatory Visit (INDEPENDENT_AMBULATORY_CARE_PROVIDER_SITE_OTHER): Payer: PRIVATE HEALTH INSURANCE | Admitting: Family Medicine

## 2016-08-30 VITALS — BP 128/80 | HR 98 | Resp 12 | Ht 68.0 in | Wt 295.0 lb

## 2016-08-30 DIAGNOSIS — T464X5A Adverse effect of angiotensin-converting-enzyme inhibitors, initial encounter: Secondary | ICD-10-CM

## 2016-08-30 DIAGNOSIS — J301 Allergic rhinitis due to pollen: Secondary | ICD-10-CM

## 2016-08-30 DIAGNOSIS — R05 Cough: Secondary | ICD-10-CM

## 2016-08-30 DIAGNOSIS — J219 Acute bronchiolitis, unspecified: Secondary | ICD-10-CM | POA: Diagnosis not present

## 2016-08-30 NOTE — Progress Notes (Signed)
Name: Courtney Robinson   MRN: [id number]    DOB: 1955/08/16   Date:08/30/2016       Progress Note  Subjective  Chief Complaint  Chief Complaint  Patient presents with  . Cough    Pt believes cough is coming from reaction to lisinopril. Wants different medication for BP.    Cough  This is a new problem. The current episode started more than 1 month ago. The problem has been unchanged. The cough is non-productive. Associated symptoms include postnasal drip. Pertinent negatives include no chest pain, chills, ear congestion, ear pain, fever, headaches, heartburn, hemoptysis, myalgias, nasal congestion, rash, rhinorrhea, sore throat, shortness of breath, sweats, weight loss or wheezing. The symptoms are aggravated by pollens. She has tried a beta-agonist inhaler and leukotriene antagonists (zyzal) for the symptoms. The treatment provided mild relief. Her past medical history is significant for environmental allergies. There is no history of asthma.    No problem-specific Assessment & Plan notes found for this encounter.   Past Medical History:  Diagnosis Date  . Allergy   . Anemia   . Displacement of intervertebral disc, site unspecified, without myelopathy   . Fibroadenoma of breast    right  . History of Lyme disease 2006/2007  . Spinal stenosis, lumbar region, without neurogenic claudication   . Vertigo     Past Surgical History:  Procedure Laterality Date  . CHOLECYSTECTOMY  2013  . ENDOMETRIAL BIOPSY  11/16/09   Disordered Proliferative Endometrium, negative for hyperplasia, atypia or malignancy    Family History  Problem Relation Age of Onset  . Breast cancer Sister   . Diabetes Mother   . Cancer Mother        pancreatic  . Hypertension Mother   . Cancer Sister        breast  . Diabetes Sister   . Asthma Sister     Social History   Social History  . Marital status: Married    Spouse name: Shingai  . Number of children: 2  . Years of education: N/A    Occupational History  . Housewife    Social History Main Topics  . Smoking status: Never Smoker  . Smokeless tobacco: Never Used  . Alcohol use No  . Drug use: No  . Sexual activity: Not on file   Other Topics Concern  . Not on file   Social History Narrative   Occupation: housewife   Married   Never smoked   Alcohol use - no   Husband is from [place]    Allergies  Allergen Reactions  . Amlodipine   . Codeine Nausea And Vomiting    Other reaction(s): Nausea  . Hydrocodone   . Metoprolol   . Loratadine Palpitations  . Olmesartan Rash    Outpatient Medications Prior to Visit  Medication Sig Dispense Refill  . Alcaftadine 0.25 % SOLN Apply 1 drop to eye daily. Each eye 3 mL 3  . guaiFENesin-codeine (ROBITUSSIN AC) 100-10 MG/5ML syrup Take 5 mLs by mouth at bedtime as needed for cough. 236 mL 0  . levocetirizine (XYZAL) 5 MG tablet Take 5 mg by mouth every evening.    . montelukast (SINGULAIR) 10 MG tablet Take 1 tablet (10 mg total) by mouth at bedtime. 30 tablet 5  . VENTOLIN HFA 108 (90 Base) MCG/ACT inhaler Take 2 puffs by mouth as needed.  0  . lisinopril (PRINIVIL,ZESTRIL) 20 MG tablet Take 1 tablet (20 mg total) by mouth daily. 90 tablet 3  No facility-administered medications prior to visit.     Review of Systems  Constitutional: Negative for chills, fever, malaise/fatigue and weight loss.  HENT: Positive for postnasal drip. Negative for ear discharge, ear pain, rhinorrhea and sore throat.   Eyes: Negative for blurred vision.  Respiratory: Positive for cough. Negative for hemoptysis, sputum production, shortness of breath and wheezing.   Cardiovascular: Negative for chest pain, palpitations and leg swelling.  Gastrointestinal: Negative for abdominal pain, blood in stool, constipation, diarrhea, heartburn, melena and nausea.  Genitourinary: Negative for dysuria, frequency, hematuria and urgency.  Musculoskeletal: Negative for back pain, joint pain,  myalgias and neck pain.  Skin: Negative for rash.  Neurological: Negative for dizziness, tingling, sensory change, focal weakness and headaches.  Endo/Heme/Allergies: Positive for environmental allergies. Negative for polydipsia. Does not bruise/bleed easily.  Psychiatric/Behavioral: Negative for depression and suicidal ideas. The patient is not nervous/anxious and does not have insomnia.      Objective  Vitals:   08/30/16 0853  BP: 128/80  Pulse: 98  Resp: 12  SpO2: 98%  Weight: 295 lb (133.8 kg)  Height: 5\' 8"  (1.727 m)    Physical Exam  Constitutional: She is well-developed, well-nourished, and in no distress. No distress.  HENT:  Head: Normocephalic and atraumatic.  Right Ear: External ear normal.  Left Ear: External ear normal.  Nose: Nose normal.  Mouth/Throat: Oropharynx is clear and moist.  Eyes: Conjunctivae and EOM are normal. Pupils are equal, round, and reactive to light. Right eye exhibits no discharge. Left eye exhibits no discharge.  Neck: Normal range of motion. Neck supple. No JVD present. No thyromegaly present.  Cardiovascular: Normal rate, regular rhythm, normal heart sounds and intact distal pulses.  Exam reveals no gallop and no friction rub.   No murmur heard. Pulmonary/Chest: Effort normal and breath sounds normal. She has no wheezes. She has no rales.  Abdominal: Soft. Bowel sounds are normal. She exhibits no mass. There is no tenderness. There is no guarding.  Musculoskeletal: Normal range of motion. She exhibits no edema.  Lymphadenopathy:    She has no cervical adenopathy.  Neurological: She is alert. She has normal reflexes.  Skin: Skin is warm and dry. She is not diaphoretic.  Psychiatric: Mood and affect normal.  Nursing note and vitals reviewed.     Assessment & Plan  Problem List Items Addressed This Visit    None    Visit Diagnoses    ACE-inhibitor cough    -  Primary   Bronchiolitis       Seasonal allergic rhinitis due to pollen           No orders of the defined types were placed in this encounter.     Dr. [facility]  08/30/16

## 2016-08-30 NOTE — Patient Instructions (Signed)
DASH Eating Plan DASH stands for "Dietary Approaches to Stop Hypertension." The DASH eating plan is a healthy eating plan that has been shown to reduce high blood pressure (hypertension). It may also reduce your risk for type 2 diabetes, heart disease, and stroke. The DASH eating plan may also help with weight loss. What are tips for following this plan? General guidelines  Avoid eating more than 2,300 mg (milligrams) of salt (sodium) a day. If you have hypertension, you may need to reduce your sodium intake to 1,500 mg a day.  Limit alcohol intake to no more than 1 drink a day for nonpregnant women and 2 drinks a day for men. One drink equals 12 oz of beer, 5 oz of wine, or 1 oz of hard liquor.  Work with your health care provider to maintain a healthy body weight or to lose weight. Ask what an ideal weight is for you.  Get at least 30 minutes of exercise that causes your heart to beat faster (aerobic exercise) most days of the week. Activities may include walking, swimming, or biking.  Work with your health care provider or diet and nutrition specialist (dietitian) to adjust your eating plan to your individual calorie needs. Reading food labels  Check food labels for the amount of sodium per serving. Choose foods with less than 5 percent of the Daily Value of sodium. Generally, foods with less than 300 mg of sodium per serving fit into this eating plan.  To find whole grains, look for the word "whole" as the first word in the ingredient list. Shopping  Buy products labeled as "low-sodium" or "no salt added."  Buy fresh foods. Avoid canned foods and premade or frozen meals. Cooking  Avoid adding salt when cooking. Use salt-free seasonings or herbs instead of table salt or sea salt. Check with your health care provider or pharmacist before using salt substitutes.  Do not fry foods. Cook foods using healthy methods such as baking, boiling, grilling, and broiling instead.  Cook with  heart-healthy oils, such as olive, canola, soybean, or sunflower oil. Meal planning   Eat a balanced diet that includes: ? 5 or more servings of fruits and vegetables each day. At each meal, try to fill half of your plate with fruits and vegetables. ? Up to 6-8 servings of whole grains each day. ? Less than 6 oz of lean meat, poultry, or fish each day. A 3-oz serving of meat is about the same size as a deck of cards. One egg equals 1 oz. ? 2 servings of low-fat dairy each day. ? A serving of nuts, seeds, or beans 5 times each week. ? Heart-healthy fats. Healthy fats called Omega-3 fatty acids are found in foods such as flaxseeds and coldwater fish, like sardines, salmon, and mackerel.  Limit how much you eat of the following: ? Canned or prepackaged foods. ? Food that is high in trans fat, such as fried foods. ? Food that is high in saturated fat, such as fatty meat. ? Sweets, desserts, sugary drinks, and other foods with added sugar. ? Full-fat dairy products.  Do not salt foods before eating.  Try to eat at least 2 vegetarian meals each week.  Eat more home-cooked food and less restaurant, buffet, and fast food.  When eating at a restaurant, ask that your food be prepared with less salt or no salt, if possible. What foods are recommended? The items listed may not be a complete list. Talk with your dietitian about what   dietary choices are best for you. Grains Whole-grain or whole-wheat bread. Whole-grain or whole-wheat pasta. Brown rice. Oatmeal. Quinoa. Bulgur. Whole-grain and low-sodium cereals. Pita bread. Low-fat, low-sodium crackers. Whole-wheat flour tortillas. Vegetables Fresh or frozen vegetables (raw, steamed, roasted, or grilled). Low-sodium or reduced-sodium tomato and vegetable juice. Low-sodium or reduced-sodium tomato sauce and tomato paste. Low-sodium or reduced-sodium canned vegetables. Fruits All fresh, dried, or frozen fruit. Canned fruit in natural juice (without  added sugar). Meat and other protein foods Skinless chicken or turkey. Ground chicken or turkey. Pork with fat trimmed off. Fish and seafood. Egg whites. Dried beans, peas, or lentils. Unsalted nuts, nut butters, and seeds. Unsalted canned beans. Lean cuts of beef with fat trimmed off. Low-sodium, lean deli meat. Dairy Low-fat (1%) or fat-free (skim) milk. Fat-free, low-fat, or reduced-fat cheeses. Nonfat, low-sodium ricotta or cottage cheese. Low-fat or nonfat yogurt. Low-fat, low-sodium cheese. Fats and oils Soft margarine without trans fats. Vegetable oil. Low-fat, reduced-fat, or light mayonnaise and salad dressings (reduced-sodium). Canola, safflower, olive, soybean, and sunflower oils. Avocado. Seasoning and other foods Herbs. Spices. Seasoning mixes without salt. Unsalted popcorn and pretzels. Fat-free sweets. What foods are not recommended? The items listed may not be a complete list. Talk with your dietitian about what dietary choices are best for you. Grains Baked goods made with fat, such as croissants, muffins, or some breads. Dry pasta or rice meal packs. Vegetables Creamed or fried vegetables. Vegetables in a cheese sauce. Regular canned vegetables (not low-sodium or reduced-sodium). Regular canned tomato sauce and paste (not low-sodium or reduced-sodium). Regular tomato and vegetable juice (not low-sodium or reduced-sodium). Pickles. Olives. Fruits Canned fruit in a light or heavy syrup. Fried fruit. Fruit in cream or butter sauce. Meat and other protein foods Fatty cuts of meat. Ribs. Fried meat. Bacon. Sausage. Bologna and other processed lunch meats. Salami. Fatback. Hotdogs. Bratwurst. Salted nuts and seeds. Canned beans with added salt. Canned or smoked fish. Whole eggs or egg yolks. Chicken or turkey with skin. Dairy Whole or 2% milk, cream, and half-and-half. Whole or full-fat cream cheese. Whole-fat or sweetened yogurt. Full-fat cheese. Nondairy creamers. Whipped toppings.  Processed cheese and cheese spreads. Fats and oils Butter. Stick margarine. Lard. Shortening. Ghee. Bacon fat. Tropical oils, such as coconut, palm kernel, or palm oil. Seasoning and other foods Salted popcorn and pretzels. Onion salt, garlic salt, seasoned salt, table salt, and sea salt. Worcestershire sauce. Tartar sauce. Barbecue sauce. Teriyaki sauce. Soy sauce, including reduced-sodium. Steak sauce. Canned and packaged gravies. Fish sauce. Oyster sauce. Cocktail sauce. Horseradish that you find on the shelf. Ketchup. Mustard. Meat flavorings and tenderizers. Bouillon cubes. Hot sauce and Tabasco sauce. Premade or packaged marinades. Premade or packaged taco seasonings. Relishes. Regular salad dressings. Where to find more information:  National Heart, Lung, and Blood Institute: www.nhlbi.nih.gov  American Heart Association: www.heart.org Summary  The DASH eating plan is a healthy eating plan that has been shown to reduce high blood pressure (hypertension). It may also reduce your risk for type 2 diabetes, heart disease, and stroke.  With the DASH eating plan, you should limit salt (sodium) intake to 2,300 mg a day. If you have hypertension, you may need to reduce your sodium intake to 1,500 mg a day.  When on the DASH eating plan, aim to eat more fresh fruits and vegetables, whole grains, lean proteins, low-fat dairy, and heart-healthy fats.  Work with your health care provider or diet and nutrition specialist (dietitian) to adjust your eating plan to your individual   calorie needs. This information is not intended to replace advice given to you by your health care provider. Make sure you discuss any questions you have with your health care provider. Document Released: 03/15/2011 Document Revised: 03/19/2016 Document Reviewed: 03/19/2016 Elsevier Interactive Patient Education  2017 Elsevier Inc.  

## 2016-09-12 ENCOUNTER — Ambulatory Visit: Payer: PRIVATE HEALTH INSURANCE | Admitting: Internal Medicine

## 2016-10-09 ENCOUNTER — Ambulatory Visit: Payer: PRIVATE HEALTH INSURANCE | Admitting: Internal Medicine

## 2017-01-01 ENCOUNTER — Ambulatory Visit: Payer: PRIVATE HEALTH INSURANCE | Admitting: Internal Medicine

## 2017-02-06 ENCOUNTER — Ambulatory Visit (INDEPENDENT_AMBULATORY_CARE_PROVIDER_SITE_OTHER): Payer: PRIVATE HEALTH INSURANCE

## 2017-02-06 DIAGNOSIS — Z23 Encounter for immunization: Secondary | ICD-10-CM | POA: Diagnosis not present

## 2017-04-23 ENCOUNTER — Encounter: Payer: Self-pay | Admitting: Internal Medicine

## 2017-04-23 ENCOUNTER — Ambulatory Visit: Payer: PRIVATE HEALTH INSURANCE | Admitting: Internal Medicine

## 2017-04-23 VITALS — BP 158/80 | HR 68 | Ht 68.0 in | Wt 283.0 lb

## 2017-04-23 DIAGNOSIS — I10 Essential (primary) hypertension: Secondary | ICD-10-CM | POA: Diagnosis not present

## 2017-04-23 MED ORDER — HYDROCHLOROTHIAZIDE 25 MG PO TABS
25.0000 mg | ORAL_TABLET | Freq: Every day | ORAL | 1 refills | Status: DC
Start: 2017-04-23 — End: 2017-05-24

## 2017-04-23 NOTE — Progress Notes (Signed)
    Date:  04/23/2017   Name:  Courtney Robinson   DOB:  [birth year]   MRN:  [id number]   Chief Complaint: Hypertension Hypertension  This is a recurrent problem. The problem has been gradually worsening since onset. The problem is uncontrolled. Pertinent negatives include no anxiety, chest pain, headaches, palpitations or peripheral edema. Past treatments include ACE inhibitors, angiotensin blockers and beta blockers. Compliance problems include medication side effects.       Review of Systems  Constitutional: Negative for chills and fatigue.  Respiratory: Negative for chest tightness.   Cardiovascular: Negative for chest pain and palpitations.  Neurological: Negative for dizziness and headaches.    Patient Active Problem List   Diagnosis Date Noted  . Environmental and seasonal allergies 07/20/2016  . 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 Left knee pain 08/12/2015  . History of palpitations 08/12/2015  . Essential (primary) hypertension 01/17/2015  . Arthralgia of hand 01/17/2015  . Adult BMI 40.0-44.9 kg/sq m ([place]) 12/30/2009    Prior to Admission medications   Not on File    Allergies  Allergen Reactions  . Amlodipine   . Codeine Nausea And Vomiting    Other reaction(s): Nausea  . Hydrocodone   . Metoprolol   . Lisinopril Cough  . Loratadine Palpitations  . Olmesartan Rash    Past Surgical History:  Procedure Laterality Date  . CHOLECYSTECTOMY  2013  . 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 Smoking status: Never Smoker  . Smokeless tobacco: Never Used  Substance Use Topics  . Alcohol use: No    Alcohol/week: 0.0 oz  . Drug use: No     Medication list has been reviewed and updated.  PHQ 2/9 Scores 04/23/2017 08/18/2015  PHQ - 2 Score 0 0    Physical Exam  Constitutional: She is oriented to person, place, and time. She appears  well-developed. No distress.  HENT:  Head: Normocephalic and atraumatic.  Neck: Normal range of motion. Neck supple.  Cardiovascular: Normal rate, regular rhythm and normal heart sounds.  Pulmonary/Chest: Effort normal and breath sounds normal. No respiratory distress. She has no wheezes.  Musculoskeletal: Normal range of motion. She exhibits no edema or tenderness.  Neurological: She is alert and oriented to person, place, and time.  Skin: Skin is warm and dry. No rash noted.  Psychiatric: She has a normal mood and affect. Her behavior is normal. Thought content normal.  Nursing note and vitals reviewed.   BP (!) 162/90   Pulse 68   Ht 5\' 8"  (1.727 m)   Wt 283 lb (128.4 kg)   SpO2 99%   BMI 43.03 kg/m   Assessment and Plan: 1. Essential (primary) hypertension Begin HCTZ daily F/u one month - hydrochlorothiazide (HYDRODIURIL) 25 MG tablet; Take 1 tablet (25 mg total) by mouth daily.  Dispense: 90 tablet; Refill: 1   Meds ordered this encounter  Medications  . hydrochlorothiazide (HYDRODIURIL) 25 MG tablet    Sig: Take 1 tablet (25 mg total) by mouth daily.    Dispense:  90 tablet    Refill:  1    Partially dictated using Editor, commissioning. Any errors are unintentional.  Halina Maidens, MD [facility]  04/23/2017

## 2017-04-25 ENCOUNTER — Telehealth: Payer: Self-pay

## 2017-04-25 NOTE — Telephone Encounter (Signed)
Patient started HCTZ 2 days ago. Has already took her morning for dose for today. Called to state she is having side affects: sore throat, chest tightness, and less urination. Please Advise.

## 2017-04-25 NOTE — Telephone Encounter (Signed)
Patient informed. Stated she read the sheets that came with the rx and stiff neck, sore throat, and chest pain are all side affects per the papers from pharmacy. She will stop for the weekend and start back on Monday with half a tablet.

## 2017-04-25 NOTE — Telephone Encounter (Signed)
I do not think that those are likely side effects. However, stop it for several days then try 1/2 tablet again.

## 2017-05-24 ENCOUNTER — Encounter: Payer: Self-pay | Admitting: Internal Medicine

## 2017-05-24 ENCOUNTER — Ambulatory Visit: Payer: PRIVATE HEALTH INSURANCE | Admitting: Internal Medicine

## 2017-05-24 VITALS — BP 154/96 | HR 74 | Ht 68.0 in | Wt 275.0 lb

## 2017-05-24 DIAGNOSIS — I10 Essential (primary) hypertension: Secondary | ICD-10-CM | POA: Diagnosis not present

## 2017-05-24 NOTE — Progress Notes (Signed)
Date:  05/24/2017   Name:  Courtney Robinson   DOB:  [birth year]   MRN:  [id number]   Chief Complaint: Hypertension (Not taking HCTZ. Stated it made her sick after trying to take it. This medication stated it caused swollen throat, numbness, chest pain. Tried taking half a tablet and still had the same symptoms after. Doing accupuncture now. ) Hypertension  This is a chronic problem. The problem is unchanged. The problem is uncontrolled. Pertinent negatives include no chest pain, headaches, palpitations or shortness of breath. There are no associated agents to hypertension. Treatments tried: multiple intolerances.  We discussed options - accupuncture and essential oils are not sufficient for control.  Will try bystolic 5 mg daily.    Review of Systems  Constitutional: Negative for chills, fatigue and fever.  Eyes: Negative for visual disturbance.  Respiratory: Negative for chest tightness, shortness of breath and wheezing.   Cardiovascular: Negative for chest pain and palpitations.  Neurological: Negative for dizziness and headaches.    Patient Active Problem List   Diagnosis Date Noted  . Environmental and seasonal allergies 07/20/2016  . 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 Left knee pain 08/12/2015  . History of palpitations 08/12/2015  . Essential (primary) hypertension 01/17/2015  . Arthralgia of hand 01/17/2015  . Adult BMI 40.0-44.9 kg/sq m ([place]) 12/30/2009    Prior to Admission medications   Medication Sig Start Date End Date Taking? Authorizing Provider  hydrochlorothiazide (HYDRODIURIL) 25 MG tablet Take 1 tablet (25 mg total) by mouth daily. Patient not taking: Reported on 05/24/2017 04/23/17   Glean Hess, MD    Allergies  Allergen Reactions  . Hydrochlorothiazide Shortness Of Breath, Swelling and Other (See Comments)    Patient states throat swelled up, felt SOB, chest pain, and tingling in arms and shoulders.  . Amlodipine   . Codeine Nausea And Vomiting    Other reaction(s): Nausea  . Hydrocodone   . Metoprolol Other (See Comments)    bradycardia  . Lisinopril Cough  . Loratadine Palpitations  . Olmesartan Rash    Past Surgical History:  Procedure Laterality Date  . CHOLECYSTECTOMY  2013  . 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 Smoking status: Never Smoker  . Smokeless tobacco: Never Used  Substance Use Topics  . Alcohol use: No    Alcohol/week: 0.0 oz  . Drug use: No     Medication list has been reviewed and updated.  PHQ 2/9 Scores 04/23/2017 08/18/2015  PHQ - 2 Score 0 0    Physical Exam  Constitutional: She is oriented to person, place, and time. She appears well-developed. No distress.  HENT:  Head: Normocephalic and atraumatic.  Neck: Normal range of motion. Neck supple. No thyromegaly present.  Cardiovascular: Normal rate, regular rhythm and normal heart sounds.  Pulmonary/Chest: Effort normal and breath sounds normal. No respiratory distress. She has no wheezes.  Musculoskeletal: Normal range of motion. She exhibits no edema.  Neurological: She is alert and oriented to person, place, and time.  Skin: Skin is warm and dry. No rash noted.  Psychiatric: She has a normal mood and affect. Her behavior is normal. Thought content normal.  Nursing note and vitals reviewed.   BP (!) 154/96   Pulse 74   Ht 5\' 8"  (1.727 m)   Wt 275 lb (124.7 kg)   SpO2 98%   BMI 41.81 kg/m  Assessment and Plan: 1. Essential (primary) hypertension Sample of bystolic 5 mg once daily given - pt will call next week if tolerated for an Rx Consider Berniece Pap as next option   No orders of the defined types were placed in this encounter.   Partially dictated using Editor, commissioning. Any errors are unintentional.  Halina Maidens, MD [facility]  05/24/2017

## 2017-05-24 NOTE — Patient Instructions (Signed)
Monitor heart rate - stop medication if heart rate falls below 60.

## 2017-05-27 ENCOUNTER — Telehealth: Payer: Self-pay

## 2017-05-27 NOTE — Telephone Encounter (Signed)
No!  She reported that her throat swelled up when she took it before so trying it again is ABSOLUTELY contraindicated. What side effects did she have from bystolic?

## 2017-05-27 NOTE — Telephone Encounter (Signed)
Advised DO NOT take HCTZ at all and to take the Bystolic. She was worried about research she did on side effects. I went over that these issues are rare and they have to say that and to please try to take this per [facility] or finish researching and call us with idea of what to take. She then asked me to wait as she was on google and saw recall...ibuprofen explained NO RECALL on Bystolic and she said oh yea this is 2002 and I need to get off of Internet.Marland Kitchenibuprofen said I strongly advise that you DO NOT GOOGLE if you want good medical care from a PCP. She agreed/

## 2017-05-27 NOTE — Telephone Encounter (Signed)
Patient called stating she wanted to know is she can take hctz 12.5 mg ( 1/2 tablet) instead of bystolic medication. She does not like the side affects of the medication for bystolic and does not wish to continue it.  Please Advise.

## 2017-06-06 ENCOUNTER — Ambulatory Visit: Payer: PRIVATE HEALTH INSURANCE | Admitting: Internal Medicine

## 2017-06-06 ENCOUNTER — Encounter: Payer: Self-pay | Admitting: Internal Medicine

## 2017-06-06 VITALS — BP 140/72 | HR 50 | Ht 68.0 in | Wt 272.0 lb

## 2017-06-06 DIAGNOSIS — I10 Essential (primary) hypertension: Secondary | ICD-10-CM

## 2017-06-06 DIAGNOSIS — N3000 Acute cystitis without hematuria: Secondary | ICD-10-CM

## 2017-06-06 LAB — POC URINALYSIS WITH MICROSCOPIC (NON AUTO)MANUAL RESULT
Bilirubin, UA: NEGATIVE
Crystals: 0
Glucose, UA: NEGATIVE
KETONES UA: NEGATIVE
Mucus, UA: 0
NITRITE UA: NEGATIVE
RBC: 1 M/uL — AB (ref 4.04–5.48)
Spec Grav, UA: 1.01 (ref 1.010–1.025)
UROBILINOGEN UA: 0.2 U/dL
WBC Casts, UA: 1
pH, UA: 5 (ref 5.0–8.0)

## 2017-06-06 NOTE — Progress Notes (Signed)
Date:  06/06/2017   Name:  Courtney Robinson   DOB:  12-30-1955   MRN:  [id number]   Chief Complaint: Urinary Tract Infection (Wanted to f/up to be sure doing better. States she still has "some symptoms". States UTI made her lightheaded and didn't know she had one. Wants rechecked today. Was given MONUROL at the hospital. )  Urinary Tract Infection   This is a new problem. The current episode started 1 to 4 weeks ago. The problem has been resolved. There has been no fever. Pertinent negatives include no chills, frequency, hematuria, nausea, urgency or vomiting.  Hypertension  This is a chronic problem. The problem has been gradually improving since onset. The problem is resistant. Pertinent negatives include no chest pain, palpitations or shortness of breath. Past treatments include beta blockers. The current treatment provides mild improvement. There are no compliance problems.   She has taken bystolic 5 mg for the past week and so far has tolerated it well.   Review of Systems  Constitutional: Negative for chills, fatigue and fever.  Respiratory: Negative for chest tightness, shortness of breath and wheezing.   Cardiovascular: Negative for chest pain and palpitations.  Gastrointestinal: Negative for abdominal pain, nausea and vomiting.  Genitourinary: Negative for decreased urine volume, dysuria, frequency, hematuria and urgency.  Musculoskeletal: Negative for arthralgias.  Psychiatric/Behavioral: Negative for sleep disturbance.    Patient Active Problem List   Diagnosis Date Noted  . Environmental and seasonal allergies 07/20/2016  . 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 Left knee pain 08/12/2015  . History of palpitations 08/12/2015  . Essential (primary) hypertension 01/17/2015  . Arthralgia of hand 01/17/2015  . Adult BMI 40.0-44.9 kg/sq m ([place]) 12/30/2009    Prior to Admission medications   Medication Sig Start Date End Date Taking? Authorizing Provider  nebivolol  (BYSTOLIC) 5 MG tablet Take 5 mg by mouth daily.   Yes [provider]    Allergies  Allergen Reactions  . Hydrochlorothiazide Shortness Of Breath, Swelling and Other (See Comments)    Patient states throat swelled up, felt SOB, chest pain, and tingling in arms and shoulders.  . Amlodipine   . Codeine Nausea And Vomiting    Other reaction(s): Nausea  . Hydrocodone   . Metoprolol Other (See Comments)    bradycardia  . Lisinopril Cough  . Loratadine Palpitations  . Olmesartan Rash    Past Surgical History:  Procedure Laterality Date  . CHOLECYSTECTOMY  2013  . 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 Smoking status: Never Smoker  . Smokeless tobacco: Never Used  Substance Use Topics  . Alcohol use: No    Alcohol/week: 0.0 oz  . Drug use: No     Medication list has been reviewed and updated.  PHQ 2/9 Scores 04/23/2017 08/18/2015  PHQ - 2 Score 0 0    Physical Exam  Constitutional: She is oriented to person, place, and time. She appears well-developed. No distress.  HENT:  Head: Normocephalic and atraumatic.  Cardiovascular: Normal rate, regular rhythm and normal heart sounds.  Pulmonary/Chest: Effort normal and breath sounds normal. No respiratory distress. She has no wheezes.  Abdominal: Soft. Bowel sounds are normal. She exhibits no distension. There is no tenderness.  Musculoskeletal: Normal range of motion.  Neurological: She is alert and oriented to person, place, and time.  Skin: Skin is warm and dry. No rash noted.  Psychiatric: She has a normal mood and affect. Her behavior is normal. Thought content normal.  Nursing note and vitals reviewed.   BP 140/72   Pulse (!) 50   Ht 5\' 8"  (1.727 m)   Wt 272 lb (123.4 kg)   SpO2 98%   BMI 41.36 kg/m   Assessment and Plan: 1. Acute cystitis without hematuria UA negative - POC urinalysis w  microscopic (non auto)  2. Essential (primary) hypertension Continue bystolic Call for Rx when samples depleted   No orders of the defined types were placed in this encounter.   Partially dictated using Editor, commissioning. Any errors are unintentional.  Halina Maidens, MD [facility]  06/06/2017

## 2017-06-20 ENCOUNTER — Other Ambulatory Visit: Payer: Self-pay

## 2017-06-20 MED ORDER — NEBIVOLOL HCL 5 MG PO TABS: 5.0000 mg | ORAL_TABLET | Freq: Every day | ORAL | 2 refills | Status: DC

## 2017-07-08 ENCOUNTER — Encounter: Payer: Self-pay | Admitting: Internal Medicine

## 2017-07-08 ENCOUNTER — Ambulatory Visit: Payer: PRIVATE HEALTH INSURANCE | Admitting: Internal Medicine

## 2017-07-08 VITALS — BP 134/78 | HR 58 | Temp 98.0°F | Ht 68.0 in | Wt 272.0 lb

## 2017-07-08 DIAGNOSIS — I10 Essential (primary) hypertension: Secondary | ICD-10-CM

## 2017-07-08 DIAGNOSIS — J0100 Acute maxillary sinusitis, unspecified: Secondary | ICD-10-CM

## 2017-07-08 MED ORDER — AMOXICILLIN 875 MG PO TABS: 875.0000 mg | ORAL_TABLET | Freq: Two times a day (BID) | ORAL | 0 refills | Status: AC

## 2017-07-08 NOTE — Progress Notes (Signed)
Date:  07/08/2017   Name:  Courtney Robinson   DOB:  Jan 01, 1956   MRN:  [id number]   Chief Complaint: Allergic Rhinitis  (Started Saturday. Using allergy supplement and XLEAR Nasal Spray. Stuffy nose, green and bloody mucous, sneezing, coughing. Headache. )  Sinusitis  This is a new problem. The current episode started in the past 7 days. The problem has been gradually worsening since onset. The maximum temperature recorded prior to her arrival was 100.4 - 100.9 F. The pain is mild. Associated symptoms include congestion, sinus pressure and sneezing. Pertinent negatives include no coughing, headaches or shortness of breath. Past treatments include saline sprays. The treatment provided mild relief.     Review of Systems  Constitutional: Negative for appetite change, fatigue, fever and unexpected weight change.  HENT: Positive for congestion, sinus pressure and sneezing. Negative for tinnitus and trouble swallowing.   Eyes: Negative for visual disturbance.  Respiratory: Negative for cough, chest tightness, shortness of breath and wheezing.   Cardiovascular: Negative for chest pain, palpitations and leg swelling.  Gastrointestinal: Negative for abdominal pain.  Endocrine: Negative for polydipsia and polyuria.  Genitourinary: Negative for dysuria and hematuria.  Musculoskeletal: Negative for arthralgias.  Allergic/Immunologic: Positive for environmental allergies.  Neurological: Negative for tremors, numbness and headaches.  Psychiatric/Behavioral: Negative for dysphoric mood.    Patient Active Problem List   Diagnosis Date Noted  . Environmental and seasonal allergies 07/20/2016  . 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 Left knee pain 08/12/2015  . History of palpitations 08/12/2015  . Essential (primary) hypertension 01/17/2015  . Arthralgia of hand 01/17/2015  . Adult BMI 40.0-44.9 kg/sq m ([place]) 12/30/2009    Prior to Admission medications   Medication Sig Start Date End Date  Taking? Authorizing Provider  nebivolol (BYSTOLIC) 5 MG tablet Take 1 tablet (5 mg total) by mouth daily. 06/20/17   Glean Hess, MD    Allergies  Allergen Reactions  . Hydrochlorothiazide Shortness Of Breath, Swelling and Other (See Comments)    Patient states throat swelled up, felt SOB, chest pain, and tingling in arms and shoulders.  . Amlodipine   . Codeine Nausea And Vomiting    Other reaction(s): Nausea  . Hydrocodone   . Metoprolol Other (See Comments)    bradycardia  . Lisinopril Cough  . Loratadine Palpitations  . Olmesartan Rash    Past Surgical History:  Procedure Laterality Date  . CHOLECYSTECTOMY  2013  . 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 Smoking status: Never Smoker  . Smokeless tobacco: Never Used  Substance Use Topics  . Alcohol use: No    Alcohol/week: 0.0 oz  . Drug use: No     Medication list has been reviewed and updated.  PHQ 2/9 Scores 04/23/2017 08/18/2015  PHQ - 2 Score 0 0    Physical Exam  Constitutional: She is oriented to person, place, and time. She appears well-developed and well-nourished.  HENT:  Right Ear: External ear and ear canal normal. Tympanic membrane is not erythematous and not retracted.  Left Ear: External ear and ear canal normal. Tympanic membrane is not erythematous and not retracted.  Nose: Right sinus exhibits maxillary sinus tenderness and frontal sinus tenderness. Left sinus exhibits maxillary sinus tenderness and frontal sinus tenderness.  Mouth/Throat: Uvula is midline and mucous membranes are normal. No oral lesions. No oropharyngeal exudate.  Cardiovascular: Normal rate, regular rhythm  and normal heart sounds.  Pulmonary/Chest: Breath sounds normal. She has no wheezes. She has no rales.  Lymphadenopathy:    She has no cervical adenopathy.  Neurological: She is alert and oriented to person,  place, and time.    BP 134/78   Pulse (!) 58   Temp 98 F (36.7 C) (Oral)   Ht 5\' 8"  (1.727 m)   Wt 272 lb (123.4 kg)   SpO2 99%   BMI 41.36 kg/m   Assessment and Plan: 1. Acute non-recurrent maxillary sinusitis Continue nasal spray and allergy medication - amoxicillin (AMOXIL) 875 MG tablet; Take 1 tablet (875 mg total) by mouth 2 (two) times daily for 10 days.  Dispense: 20 tablet; Refill: 0  2. Essential (primary) hypertension Continue bystolic   Meds ordered this encounter  Medications  . amoxicillin (AMOXIL) 875 MG tablet    Sig: Take 1 tablet (875 mg total) by mouth 2 (two) times daily for 10 days.    Dispense:  20 tablet    Refill:  0    Partially dictated using Editor, commissioning. Any errors are unintentional.  Halina Maidens, MD [facility]  07/08/2017

## 2017-07-12 ENCOUNTER — Encounter: Payer: PRIVATE HEALTH INSURANCE | Admitting: Internal Medicine

## 2017-08-12 ENCOUNTER — Other Ambulatory Visit: Payer: Self-pay | Admitting: Internal Medicine

## 2017-08-12 ENCOUNTER — Ambulatory Visit
Admission: RE | Admit: 2017-08-12 | Discharge: 2017-08-12 | Disposition: A | Discharge status: Home / Self Care | Payer: PRIVATE HEALTH INSURANCE | Source: Ambulatory Visit | Attending: Internal Medicine | Admitting: Internal Medicine

## 2017-08-12 DIAGNOSIS — Z1231 Encounter for screening mammogram for malignant neoplasm of breast: Secondary | ICD-10-CM

## 2017-08-13 ENCOUNTER — Encounter: Payer: Self-pay | Admitting: Internal Medicine

## 2017-08-13 ENCOUNTER — Ambulatory Visit (INDEPENDENT_AMBULATORY_CARE_PROVIDER_SITE_OTHER): Payer: PRIVATE HEALTH INSURANCE | Admitting: Internal Medicine

## 2017-08-13 VITALS — BP 128/86 | HR 62 | Resp 16 | Ht 68.0 in | Wt 278.4 lb

## 2017-08-13 DIAGNOSIS — I10 Essential (primary) hypertension: Secondary | ICD-10-CM | POA: Diagnosis not present

## 2017-08-13 DIAGNOSIS — Z0000 Encounter for general adult medical examination without abnormal findings: Secondary | ICD-10-CM

## 2017-08-13 DIAGNOSIS — Z6841 Body Mass Index (BMI) 40.0 and over, adult: Secondary | ICD-10-CM | POA: Diagnosis not present

## 2017-08-13 DIAGNOSIS — Z0001 Encounter for general adult medical examination with abnormal findings: Secondary | ICD-10-CM | POA: Diagnosis not present

## 2017-08-13 LAB — POCT URINALYSIS DIPSTICK
Bilirubin, UA: NEGATIVE
Glucose, UA: NEGATIVE
Ketones, UA: NEGATIVE
Nitrite, UA: NEGATIVE
Protein, UA: NEGATIVE
Spec Grav, UA: 1.02 (ref 1.010–1.025)
Urobilinogen, UA: 0.2 U/dL
pH, UA: 5 (ref 5.0–8.0)

## 2017-08-13 MED ORDER — NEBIVOLOL HCL 5 MG PO TABS: 5.0000 mg | ORAL_TABLET | Freq: Every day | ORAL | 12 refills | Status: DC

## 2017-08-13 NOTE — Patient Instructions (Addendum)
A Step to [place] Breast self-awareness means being familiar with how your breasts look and feel. It involves checking your breasts regularly and reporting any changes to your health care provider. Practicing breast self-awareness is important. A change in your breasts can be a sign of a serious medical problem. Being familiar with how your breasts look and feel allows you to find any problems early, when treatment is more likely to be successful. All women should practice breast self-awareness, including women who have had breast implants. How to do a breast self-exam One way to learn what is normal for your breasts and whether your breasts are changing is to do a breast self-exam. To do a breast self-exam: Look for Changes  1. Remove all the clothing above your waist. 2. Stand in front of a mirror in a room with good lighting. 3. Put your hands on your hips. 4. Push your hands firmly downward. 5. Compare your breasts in the mirror. Look for differences between them (asymmetry), such as: ? Differences in shape. ? Differences in size. ? Puckers, dips, and bumps in one breast and not the other. 6. Look at each breast for changes in your skin, such as: ? Redness. ? Scaly areas. 7. Look for changes in your nipples, such as: ? Discharge. ? Bleeding. ? Dimpling. ? Redness. ? A change in position. Feel for Changes  Carefully feel your breasts for lumps and changes. It is best to do this while lying on your back on the floor and again while sitting or standing in the shower or tub with soapy water on your skin. Feel each breast in the following way:  Place the arm on the side of the breast you are examining above your head.  Feel your breast with the other hand.  Start in the nipple area and make  inch (2 cm) overlapping circles to feel your breast. Use the pads of your three middle fingers to do this. Apply light pressure, then medium  pressure, then firm pressure. The light pressure will allow you to feel the tissue closest to the skin. The medium pressure will allow you to feel the tissue that is a little deeper. The firm pressure will allow you to feel the tissue close to the ribs.  Continue the overlapping circles, moving downward over the breast until you feel your ribs below your breast.  Move one finger-width toward the center of the body. Continue to use the  inch (2 cm) overlapping circles to feel your breast as you move slowly up toward your collarbone.  Continue the up and down exam using all three pressures until you reach your armpit.  Write Down What You Find  Write down what is normal for each breast and any changes that you find. Keep a written record with breast changes or normal findings for each breast. By writing this information down, you do not need to depend only on memory for size, tenderness, or location. Write down where you are in your menstrual cycle, if you are still menstruating. If you are having trouble noticing differences in your breasts, do not get discouraged. With time you will become more familiar with the variations in your breasts and more comfortable with the exam. How often should I examine my breasts? Examine your breasts every month. If you are breastfeeding, the best time to examine your breasts is after a feeding or after using a breast pump. If you menstruate, the best time  to examine your breasts is 5-7 days after your period is over. During your period, your breasts are lumpier, and it may be more difficult to notice changes. When should I see my health care provider? See your health care provider if you notice:  A change in shape or size of your breasts or nipples.  A change in the skin of your breast or nipples, such as a reddened or scaly area.  Unusual discharge from your nipples.  A lump or thick area that was not there before.  Pain in your breasts.  Anything that  concerns you.  This information is not intended to replace advice given to you by your health care provider. Make sure you discuss any questions you have with your health care provider. Document Released: 03/26/2005 Document Revised: 09/01/2015 Document Reviewed: 02/13/2015 Elsevier Interactive Patient Education  [employer].

## 2017-08-13 NOTE — Progress Notes (Signed)
Date:  08/13/2017   Name:  Courtney Robinson   DOB:  [birth year]   MRN:  [id number]   Chief Complaint: Annual Exam Courtney Robinson is a 62 y.o. female who presents today for her Complete Annual Exam. She feels well. She reports exercising regularly. She reports she is sleeping well.   Hypertension  This is a chronic problem. The problem has been gradually improving since onset. Pertinent negatives include no chest pain, headaches, palpitations or shortness of breath. There are no associated agents to hypertension. Past treatments include beta blockers.   She sees chiropractor for TMJ, edema and muscle pain.  She also does accupuncture regularly.  She needs to find a new massage therapist.  Review of Systems  Constitutional: Negative for chills, fatigue and fever.  HENT: Negative for congestion, hearing loss, tinnitus, trouble swallowing and voice change.   Eyes: Negative for visual disturbance.  Respiratory: Negative for cough, chest tightness, shortness of breath and wheezing.   Cardiovascular: Negative for chest pain, palpitations and leg swelling.  Gastrointestinal: Negative for abdominal pain, constipation, diarrhea and vomiting.  Endocrine: Negative for polydipsia and polyuria.  Genitourinary: Negative for dysuria, frequency, genital sores, vaginal bleeding and vaginal discharge.  Musculoskeletal: Positive for arthralgias (TMJ - working with chiropractor and accupuncture). Negative for gait problem and joint swelling.  Skin: Negative for color change and rash.  Neurological: Negative for dizziness, tremors, light-headedness and headaches.  Hematological: Negative for adenopathy. Does not bruise/bleed easily.  Psychiatric/Behavioral: Negative for dysphoric mood and sleep disturbance. The patient is not nervous/anxious.     Patient Active Problem List   Diagnosis Date Noted  . Environmental and seasonal allergies 07/20/2016  . 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 Left  knee pain 08/12/2015  . History of palpitations 08/12/2015  . Essential (primary) hypertension 01/17/2015  . Arthralgia of hand 01/17/2015  . Adult BMI 40.0-44.9 kg/sq m ([place]) 12/30/2009    Prior to Admission medications   Medication Sig Start Date End Date Taking? Authorizing Provider  nebivolol (BYSTOLIC) 5 MG tablet Take 1 tablet (5 mg total) by mouth daily. 06/20/17  Yes Glean Hess, MD    Allergies  Allergen Reactions  . Hydrochlorothiazide Shortness Of Breath, Swelling and Other (See Comments)    Patient states throat swelled up, felt SOB, chest pain, and tingling in arms and shoulders.  . Amlodipine   . Codeine Nausea And Vomiting    Other reaction(s): Nausea  . Hydrocodone   . Metoprolol Other (See Comments)    bradycardia  . Lisinopril Cough  . Loratadine Palpitations  . Olmesartan Rash    Past Surgical History:  Procedure Laterality Date  . CHOLECYSTECTOMY  2013  . 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 Smoking status: Never Smoker  . Smokeless tobacco: Never Used  Substance Use Topics  . Alcohol use: No    Alcohol/week: 0.0 oz  . Drug use: No     Medication list has been reviewed and updated.  PHQ 2/9 Scores 08/13/2017 04/23/2017 08/18/2015  PHQ - 2 Score 0 0 0    Physical Exam  Constitutional: She is oriented to person, place, and time. She appears well-developed and well-nourished. No distress.  HENT:  Head: Normocephalic and atraumatic.  Right Ear: Tympanic membrane and ear canal normal.  Left Ear: Tympanic membrane and ear canal normal.  Nose: Right sinus exhibits no maxillary sinus  tenderness. Left sinus exhibits no maxillary sinus tenderness.  Mouth/Throat: Uvula is midline and oropharynx is clear and moist.  Eyes: Conjunctivae and EOM are normal. Right eye exhibits no discharge. Left eye exhibits no discharge. No scleral icterus.   Neck: Normal range of motion. Carotid bruit is not present. No erythema present. No thyromegaly present.  Cardiovascular: Normal rate, regular rhythm, normal heart sounds and normal pulses.  Pulmonary/Chest: Effort normal. No respiratory distress. She has no wheezes. Right breast exhibits no mass, no nipple discharge, no skin change and no tenderness. Left breast exhibits no mass, no nipple discharge, no skin change and no tenderness.  Abdominal: Soft. Bowel sounds are normal. There is no hepatosplenomegaly. There is no tenderness. There is no CVA tenderness.  Musculoskeletal: Normal range of motion.  Lymphadenopathy:    She has no cervical adenopathy.    She has no axillary adenopathy.  Neurological: She is alert and oriented to person, place, and time. She has normal reflexes. No cranial nerve deficit or sensory deficit.  Skin: Skin is warm, dry and intact. No rash noted.  Psychiatric: She has a normal mood and affect. Her speech is normal and behavior is normal. Thought content normal.  Nursing note and vitals reviewed.   BP 128/86   Pulse 62   Resp 16   Ht 5\' 8"  (1.727 m)   Wt 278 lb 6.4 oz (126.3 kg)   SpO2 100%   BMI 42.33 kg/m   Assessment and Plan: 1. Annual physical exam Mammogram yesterday normal - Lipid panel - POCT urinalysis dipstick  2. Essential (primary) hypertension Continue diet and exercise and bystolic - CBC with Differential/Platelet - Comprehensive metabolic panel - TSH - nebivolol (BYSTOLIC) 5 MG tablet; Take 1 tablet (5 mg total) by mouth daily.  Dispense: 30 tablet; Refill: 12  3. Adult BMI 40.0-44.9 kg/sq m ([facility]) Continue diet and weight loss - Hemoglobin A1c   Meds ordered this encounter  Medications  . nebivolol (BYSTOLIC) 5 MG tablet    Sig: Take 1 tablet (5 mg total) by mouth daily.    Dispense:  30 tablet    Refill:  12    Has coupon    Partially dictated using Editor, commissioning. Any errors are unintentional.  Halina Maidens,  MD [facility]  08/13/2017

## 2017-08-14 LAB — CBC WITH DIFFERENTIAL/PLATELET
BASOS ABS: 0.1 10*3/uL (ref 0.0–0.2)
Basos: 1 %
EOS (ABSOLUTE): 0.1 10*3/uL (ref 0.0–0.4)
Eos: 2 %
Hematocrit: 41.8 % (ref 34.0–46.6)
Hemoglobin: 13.8 g/dL (ref 11.1–15.9)
Immature Grans (Abs): 0 10*3/uL (ref 0.0–0.1)
Immature Granulocytes: 0 %
LYMPHS ABS: 2.6 10*3/uL (ref 0.7–3.1)
Lymphs: 39 %
MCH: 29.9 pg (ref 26.6–33.0)
MCHC: 33 g/dL (ref 31.5–35.7)
MCV: 91 fL (ref 79–97)
MONOCYTES: 6 %
Monocytes Absolute: 0.4 10*3/uL (ref 0.1–0.9)
NEUTROS ABS: 3.5 10*3/uL (ref 1.4–7.0)
Neutrophils: 52 %
PLATELETS: 252 10*3/uL (ref 150–379)
RBC: 4.61 x10E6/uL (ref 3.77–5.28)
RDW: 14.3 % (ref 12.3–15.4)
WBC: 6.8 10*3/uL (ref 3.4–10.8)

## 2017-08-14 LAB — LIPID PANEL
CHOL/HDL RATIO: 3.1 ratio (ref 0.0–4.4)
Cholesterol, Total: 195 mg/dL (ref 100–199)
HDL: 62 mg/dL (ref >39)
LDL CALC: 110 mg/dL — AB (ref 0–99)
TRIGLYCERIDES: 115 mg/dL (ref 0–149)
VLDL Cholesterol Cal: 23 mg/dL (ref 5–40)

## 2017-08-14 LAB — COMPREHENSIVE METABOLIC PANEL
ALK PHOS: 76 IU/L (ref 39–117)
ALT: 13 IU/L (ref 0–32)
AST: 18 IU/L (ref 0–40)
Albumin/Globulin Ratio: 1.3 (ref 1.2–2.2)
Albumin: 4.5 g/dL (ref 3.6–4.8)
BILIRUBIN TOTAL: 0.3 mg/dL (ref 0.0–1.2)
BUN / CREAT RATIO: 17 (ref 12–28)
BUN: 17 mg/dL (ref 8–27)
CHLORIDE: 101 mmol/L (ref 96–106)
CO2: 19 mmol/L — ABNORMAL LOW (ref 20–29)
Calcium: 9.7 mg/dL (ref 8.7–10.3)
Creatinine, Ser: 1.03 mg/dL — ABNORMAL HIGH (ref 0.57–1.00)
GFR calc non Af Amer: 58 mL/min/{1.73_m2} — ABNORMAL LOW (ref >59)
GFR, EST AFRICAN AMERICAN: 67 mL/min/{1.73_m2} (ref >59)
GLUCOSE: 88 mg/dL (ref 65–99)
Globulin, Total: 3.6 g/dL (ref 1.5–4.5)
POTASSIUM: 4.7 mmol/L (ref 3.5–5.2)
Sodium: 140 mmol/L (ref 134–144)
Total Protein: 8.1 g/dL (ref 6.0–8.5)

## 2017-08-14 LAB — HEMOGLOBIN A1C
Est. average glucose Bld gHb Est-mCnc: 126 mg/dL
Hgb A1c MFr Bld: 6 % — ABNORMAL HIGH (ref 4.8–5.6)

## 2017-08-14 LAB — TSH: TSH: 2.11 u[IU]/mL (ref 0.450–4.500)

## 2017-08-16 ENCOUNTER — Other Ambulatory Visit: Payer: Self-pay | Admitting: Internal Medicine

## 2017-08-16 DIAGNOSIS — R7303 Prediabetes: Secondary | ICD-10-CM | POA: Insufficient documentation

## 2017-08-20 ENCOUNTER — Other Ambulatory Visit: Payer: Self-pay | Admitting: Internal Medicine

## 2017-08-20 DIAGNOSIS — I10 Essential (primary) hypertension: Secondary | ICD-10-CM

## 2017-08-20 MED ORDER — NEBIVOLOL HCL 5 MG PO TABS: 5.0000 mg | ORAL_TABLET | Freq: Every day | ORAL | 3 refills | Status: DC

## 2017-09-27 ENCOUNTER — Telehealth: Payer: Self-pay

## 2017-09-27 NOTE — Telephone Encounter (Signed)
LM that BP elevated after dental procedure. Wants BP visit walk in and to check machine. Advised on her VM that she will need an OV and to call back if BP is still elevated and also advised she can ask dental group that did procedure if this is common after that procedure.

## 2017-10-15 ENCOUNTER — Ambulatory Visit (INDEPENDENT_AMBULATORY_CARE_PROVIDER_SITE_OTHER): Payer: PRIVATE HEALTH INSURANCE | Admitting: Internal Medicine

## 2017-10-15 ENCOUNTER — Encounter: Payer: Self-pay | Admitting: Internal Medicine

## 2017-10-15 VITALS — BP 124/82 | HR 65 | Temp 97.6°F | Ht 68.0 in | Wt 278.0 lb

## 2017-10-15 DIAGNOSIS — M26622 Arthralgia of left temporomandibular joint: Secondary | ICD-10-CM | POA: Diagnosis not present

## 2017-10-15 DIAGNOSIS — I10 Essential (primary) hypertension: Secondary | ICD-10-CM

## 2017-10-15 NOTE — Progress Notes (Signed)
Date:  10/15/2017   Name:  Courtney Robinson   DOB:  [birth year]   MRN:  [id number]   Chief Complaint: Temporomandibular Joint Pain (Pt stated having numbness cameback, tingling--3 weeks ago tooth pulled) and Eye Problem  Eye Problem   The left eye is affected. This is a recurrent (mild twitching of left upper lid) problem. Pertinent negatives include no fever.  Hypertension  This is a chronic problem. The problem is unchanged. The problem is controlled. Pertinent negatives include no chest pain, headaches, palpitations or shortness of breath. Past treatments include beta blockers.  TMJ - having numbness on left side of face after dental work.  Courtney Robinson is improving with chiropractic care and accu puncture.  Courtney Robinson has been evaluated by 2 eye doctors - Courtney Robinson has early cararacts but no other eye disease.  Twitching is likely due to stress.   Review of Systems  Constitutional: Negative for chills, diaphoresis, fever and unexpected weight change.  HENT: Positive for dental problem.   Eyes: Negative for visual disturbance.  Respiratory: Negative for cough, chest tightness, shortness of breath and wheezing.   Cardiovascular: Negative for chest pain, palpitations and leg swelling.  Musculoskeletal: Positive for arthralgias.  Skin: Negative for color change and rash.  Neurological: Negative for dizziness and headaches.  Psychiatric/Behavioral: Negative for dysphoric mood and sleep disturbance.    Patient Active Problem List   Diagnosis Date Noted  . Pre-diabetes 08/16/2017  . Environmental and seasonal allergies 07/20/2016  . 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 Left knee pain 08/12/2015  . History of palpitations 08/12/2015  . Essential (primary) hypertension 01/17/2015  . Arthralgia of hand 01/17/2015  . Adult BMI 40.0-44.9 kg/sq m ([place]) 12/30/2009    Prior to Admission medications   Medication Sig Start Date End Date Taking? Authorizing Provider  nebivolol (BYSTOLIC) 5 MG tablet Take 1  tablet (5 mg total) by mouth daily. 08/20/17  Yes Glean Hess, MD    Allergies  Allergen Reactions  . Hydrochlorothiazide Shortness Of Breath, Swelling and Other (See Comments)    Patient states throat swelled up, felt SOB, chest pain, and tingling in arms and shoulders.  . Amlodipine   . Hydrocodone   . Codeine Nausea And Vomiting  . Lisinopril Cough  . Loratadine Palpitations  . Metoprolol Other (See Comments)    bradycardia  . Olmesartan Rash    Past Surgical History:  Procedure Laterality Date  . CHOLECYSTECTOMY  2013  . 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 Smoking status: Never Smoker  . Smokeless tobacco: Never Used  Substance Use Topics  . Alcohol use: No    Alcohol/week: 0.0 oz  . Drug use: No     Medication list has been reviewed and updated.  Current Meds  Medication Sig  . nebivolol (BYSTOLIC) 5 MG tablet Take 1 tablet (5 mg total) by mouth daily.    PHQ 2/9 Scores 08/13/2017 04/23/2017 08/18/2015  PHQ - 2 Score 0 0 0    Physical Exam  Constitutional: Courtney Robinson is oriented to person, place, and time. Courtney Robinson appears well-developed. No distress.  HENT:  Head: Normocephalic and atraumatic.    Neck: Normal range of motion. Neck supple.  Cardiovascular: Normal rate, regular rhythm and normal heart sounds.  Pulmonary/Chest: Effort normal and breath sounds normal. No respiratory distress.  Musculoskeletal: Normal range of motion.  Neurological: Courtney Robinson is alert and oriented to  person, place, and time.  Skin: Skin is warm and dry. No rash noted.  Psychiatric: Courtney Robinson has a normal mood and affect. Her behavior is normal. Thought content normal.  Nursing note and vitals reviewed.   BP 124/82   Pulse 65   Temp 97.6 F (36.4 C)   Ht 5\' 8"  (1.727 m)   Wt 278 lb (126.1 kg)   SpO2 98%   BMI 42.27 kg/m   Assessment and Plan: 1. Essential (primary)  hypertension controlled  2. Arthralgia of left temporomandibular joint Continue accu puncture and chiropractic care, along with heat or ice   No orders of the defined types were placed in this encounter.   Partially dictated using Editor, commissioning. Any errors are unintentional.  Halina Maidens, MD [facility]  10/15/2017

## 2018-01-30 ENCOUNTER — Encounter: Payer: Self-pay | Admitting: Internal Medicine

## 2018-01-30 ENCOUNTER — Ambulatory Visit: Payer: PRIVATE HEALTH INSURANCE | Admitting: Internal Medicine

## 2018-01-30 VITALS — BP 128/70 | HR 56 | Temp 97.8°F | Ht 68.0 in | Wt 280.0 lb

## 2018-01-30 DIAGNOSIS — Z23 Encounter for immunization: Secondary | ICD-10-CM | POA: Diagnosis not present

## 2018-01-30 DIAGNOSIS — L245 Irritant contact dermatitis due to other chemical products: Secondary | ICD-10-CM

## 2018-01-30 MED ORDER — ZOSTER VAC RECOMB ADJUVANTED 50 MCG/0.5ML IM SUSR: 0.5000 mL | Freq: Once | INTRAMUSCULAR | 1 refills | Status: AC

## 2018-01-30 NOTE — Progress Notes (Signed)
Date:  01/30/2018   Name:  Courtney Robinson   DOB:  [birth year]   MRN:  [id number]   Chief Complaint: Rash (Painful rash and itching. Started on neck and into chest. Now onto arm and legs. Tried benadryl. Started saturday. Had accupunture on Friday. )  Rash  This is a new problem. The current episode started in the past 7 days. The problem is unchanged. The affected locations include the neck, left arm and right arm. The rash is characterized by burning, itchiness and redness. She was exposed to chemicals. Pertinent negatives include no cough, fatigue or shortness of breath. Past treatments include nothing.  She used a new topical agent from her chiropractor/accupuncturist that she believes caused the rash.  At first she thought it was shingles.  Review of Systems  Constitutional: Negative for chills, fatigue and unexpected weight change.  Respiratory: Negative for cough, chest tightness and shortness of breath.   Cardiovascular: Negative for chest pain and palpitations.  Musculoskeletal: Positive for myalgias.  Skin: Positive for rash.  Psychiatric/Behavioral: Negative for dysphoric mood and sleep disturbance.    Patient Active Problem List   Diagnosis Date Noted  . Arthralgia of left temporomandibular joint 10/15/2017  . Pre-diabetes 08/16/2017  . Environmental and seasonal allergies 07/20/2016  . 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 Left knee pain 08/12/2015  . History of palpitations 08/12/2015  . Essential (primary) hypertension 01/17/2015  . Arthralgia of hand 01/17/2015  . Adult BMI 40.0-44.9 kg/sq m ([place]) 12/30/2009    Allergies  Allergen Reactions  . Hydrochlorothiazide Shortness Of Breath, Swelling and Other (See Comments)    Patient states throat swelled up, felt SOB, chest pain, and tingling in arms and shoulders.  . Amlodipine   . Hydrocodone   . Codeine Nausea And Vomiting  . Lisinopril Cough  . Loratadine Palpitations  . Metoprolol Other (See Comments)   bradycardia  . Olmesartan Rash    Past Surgical History:  Procedure Laterality Date  . CHOLECYSTECTOMY  2013  . 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 Smoking status: Never Smoker  . Smokeless tobacco: Never Used  Substance Use Topics  . Alcohol use: No    Alcohol/week: 0.0 standard drinks  . Drug use: No     Medication list has been reviewed and updated.  Current Meds  Medication Sig  . nebivolol (BYSTOLIC) 5 MG tablet Take 1 tablet (5 mg total) by mouth daily.    PHQ 2/9 Scores 08/13/2017 04/23/2017 08/18/2015  PHQ - 2 Score 0 0 0    Physical Exam  Constitutional: She is oriented to person, place, and time. She appears well-developed. No distress.  HENT:  Head: Normocephalic and atraumatic.  Cardiovascular: Normal rate and regular rhythm.  Pulmonary/Chest: Effort normal and breath sounds normal. No respiratory distress.  Musculoskeletal: Normal range of motion.  Neurological: She is alert and oriented to person, place, and time.  Skin: Skin is warm and dry. No rash noted.     Psychiatric: She has a normal mood and affect. Her behavior is normal. Thought content normal.  Nursing note and vitals reviewed.   BP 128/70 (BP Location: Right Arm, Patient Position: Sitting, Cuff Size: Normal)   Pulse (!) 56   Temp 97.8 F (36.6 C) (Oral)   Ht 5\' 8"  (1.727 m)   Wt 280 lb (127 kg)   SpO2 98%   BMI 42.57 kg/m  Assessment and Plan: 1. Irritant contact dermatitis due to other chemical products Take benadryl at least twice a day Hypoallergenic lotion topically as needed  2. Need for shingles vaccine - Zoster Vaccine Adjuvanted [facility]) injection; Inject 0.5 mLs into the muscle once for 1 dose.  Dispense: 0.5 mL; Refill: 1   Partially dictated using Editor, commissioning. Any errors are unintentional.  Halina Maidens, MD [facility]  01/30/2018

## 2018-01-30 NOTE — Patient Instructions (Addendum)
Benadryl 1 tablet at least twice a day.   12.5 to 25 mg per dose  Use topical hypoallergenic lotions prn

## 2018-02-12 NOTE — Progress Notes (Deleted)
    Date:  02/13/2018   Name:  Courtney Robinson   DOB:  1956/01/10   MRN:  [id number]   Chief Complaint: No chief complaint on file.  Hypertension  This is a chronic problem. The problem is controlled. Pertinent negatives include no chest pain, peripheral edema or shortness of breath. Past treatments include beta blockers. The current treatment provides significant improvement.  Diabetes  She presents for her follow-up diabetic visit. Diabetes type: prediabetes. Her disease course has been stable. Pertinent negatives for diabetes include no chest pain. Symptoms are stable. Risk factors for coronary artery disease include hypertension. When asked about current treatments, none were reported. An ACE inhibitor/angiotensin II receptor blocker is not being taken.   Lab Results  Component Value Date   HGBA1C 6.0 (H) 08/13/2017   Lab Results  Component Value Date   CREATININE 1.03 (H) 08/13/2017   BUN 17 08/13/2017   NA 140 08/13/2017   K 4.7 08/13/2017   CL 101 08/13/2017   CO2 19 (L) 08/13/2017    Review of Systems  Respiratory: Negative for shortness of breath.   Cardiovascular: Negative for chest pain.    Patient Active Problem List   Diagnosis Date Noted  . Arthralgia of left temporomandibular joint 10/15/2017  . Pre-diabetes 08/16/2017  . Environmental and seasonal allergies 07/20/2016  . 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 Left knee pain 08/12/2015  . History of palpitations 08/12/2015  . Essential (primary) hypertension 01/17/2015  . Arthralgia of hand 01/17/2015  . Adult BMI 40.0-44.9 kg/sq m ([place]) 12/30/2009    Allergies  Allergen Reactions  . Hydrochlorothiazide Shortness Of Breath, Swelling and Other (See Comments)    Patient states throat swelled up, felt SOB, chest pain, and tingling in arms and shoulders.  . Amlodipine   . Hydrocodone   . Codeine Nausea And Vomiting  . Lisinopril Cough  . Loratadine Palpitations  . Metoprolol Other (See Comments)   bradycardia  . Olmesartan Rash    Past Surgical History:  Procedure Laterality Date  . CHOLECYSTECTOMY  2013  . 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 Smoking status: Never Smoker  . Smokeless tobacco: Never Used  Substance Use Topics  . Alcohol use: No    Alcohol/week: 0.0 standard drinks  . Drug use: No     Medication list has been reviewed and updated.  No outpatient medications have been marked as taking for the 02/13/18 encounter (Appointment) with Glean Hess, MD.    [facility] 2/9 Scores 08/13/2017 04/23/2017 08/18/2015  PHQ - 2 Score 0 0 0    Physical Exam  There were no vitals taken for this visit.  Assessment and Plan:

## 2018-02-13 ENCOUNTER — Ambulatory Visit: Payer: PRIVATE HEALTH INSURANCE | Admitting: Internal Medicine

## 2018-03-14 ENCOUNTER — Encounter: Payer: Self-pay | Admitting: Internal Medicine

## 2018-03-14 ENCOUNTER — Ambulatory Visit: Payer: PRIVATE HEALTH INSURANCE | Admitting: Internal Medicine

## 2018-03-14 VITALS — BP 132/80 | HR 60 | Ht 68.0 in | Wt 283.0 lb

## 2018-03-14 DIAGNOSIS — R7303 Prediabetes: Secondary | ICD-10-CM | POA: Diagnosis not present

## 2018-03-14 DIAGNOSIS — N3001 Acute cystitis with hematuria: Secondary | ICD-10-CM

## 2018-03-14 DIAGNOSIS — I10 Essential (primary) hypertension: Secondary | ICD-10-CM | POA: Diagnosis not present

## 2018-03-14 LAB — POC URINALYSIS WITH MICROSCOPIC (NON AUTO)MANUAL RESULT
BILIRUBIN UA: NEGATIVE
Crystals: 0
Glucose, UA: NEGATIVE
Ketones, UA: NEGATIVE
Mucus, UA: 0
Nitrite, UA: NEGATIVE
Protein, UA: NEGATIVE
RBC: 0 M/uL — AB (ref 4.04–5.48)
Spec Grav, UA: <=1.005 — AB (ref 1.010–1.025)
UROBILINOGEN UA: 0.2 U/dL
WBC Casts, UA: 5
pH, UA: 6 (ref 5.0–8.0)

## 2018-03-14 MED ORDER — FOSFOMYCIN TROMETHAMINE 3 G PO PACK: 3.0000 g | PACK | Freq: Once | ORAL | 0 refills | Status: AC

## 2018-03-14 NOTE — Progress Notes (Signed)
Date:  03/14/2018   Name:  Courtney Robinson   DOB:  [birth year]   MRN:  [id number]   Chief Complaint: Urinary Tract Infection (Started Burning outside of vagina, frequency, and fatigued. Last UTI was February at [facility]. Lightheaded. )  Urinary Tract Infection   This is a new problem. The current episode started in the past 7 days. The problem occurs every urination. The problem has been unchanged. The quality of the pain is described as burning. The pain is mild. There has been no fever. Pertinent negatives include no chills, hematuria, nausea or urgency. She has tried increased fluids for the symptoms. The treatment provided no relief.  Hypertension  This is a chronic problem. The problem is controlled. Pertinent negatives include no blurred vision, chest pain, headaches, palpitations or shortness of breath. Past treatments include beta blockers. The current treatment provides significant improvement.  Diabetes  She presents for her follow-up diabetic visit. Diabetes type: pre-diabetes. Pertinent negatives for hypoglycemia include no headaches or tremors. Pertinent negatives for diabetes include no blurred vision, no chest pain, no fatigue, no foot paresthesias, no polydipsia, no polyuria, no weakness and no weight loss.   Lab Results  Component Value Date   HGBA1C 6.0 (H) 08/13/2017     Review of Systems  Constitutional: Negative for appetite change, chills, fatigue, fever, unexpected weight change and weight loss.  HENT: Negative for tinnitus and trouble swallowing.   Eyes: Negative for blurred vision and visual disturbance.  Respiratory: Negative for cough, chest tightness and shortness of breath.   Cardiovascular: Negative for chest pain, palpitations and leg swelling.  Gastrointestinal: Negative for abdominal pain and nausea.  Endocrine: Negative for polydipsia and polyuria.  Genitourinary: Negative for dysuria, hematuria and urgency.  Musculoskeletal: Negative for  arthralgias.  Neurological: Negative for tremors, weakness, numbness and headaches.  Psychiatric/Behavioral: Negative for dysphoric mood.    Patient Active Problem List   Diagnosis Date Noted  . Arthralgia of left temporomandibular joint 10/15/2017  . Pre-diabetes 08/16/2017  . Environmental and seasonal allergies 07/20/2016  . 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 Left knee pain 08/12/2015  . History of palpitations 08/12/2015  . Essential (primary) hypertension 01/17/2015  . Arthralgia of hand 01/17/2015  . Adult BMI 40.0-44.9 kg/sq m ([place]) 12/30/2009    Allergies  Allergen Reactions  . Hydrochlorothiazide Shortness Of Breath, Swelling and Other (See Comments)    Patient states throat swelled up, felt SOB, chest pain, and tingling in arms and shoulders.  . Amlodipine   . Hydrocodone   . Codeine Nausea And Vomiting  . Lisinopril Cough  . Loratadine Palpitations  . Metoprolol Other (See Comments)    bradycardia  . Olmesartan Rash    Past Surgical History:  Procedure Laterality Date  . CHOLECYSTECTOMY  2013  . 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 Smoking status: Never Smoker  . Smokeless tobacco: Never Used  Substance Use Topics  . Alcohol use: No    Alcohol/week: 0.0 standard drinks  . Drug use: No     Medication list has been reviewed and updated.  Current Meds  Medication Sig  . D-Mannose POWD Take by mouth.  Haydee Salter Leaf POWD by Does not apply route.  . nebivolol (BYSTOLIC) 5 MG tablet Take 1 tablet (5 mg total) by mouth daily.  Marland Kitchen UNABLE TO FIND A-F Betafood    PHQ 2/9 Scores 08/13/2017  04/23/2017 08/18/2015  PHQ - 2 Score 0 0 0    Physical Exam  Constitutional: She appears well-developed and well-nourished.  Neck: Normal range of motion. Neck supple.  Cardiovascular: Normal rate, regular rhythm and normal heart sounds.    Pulmonary/Chest: Effort normal and breath sounds normal. No respiratory distress.  Abdominal: Soft. Bowel sounds are normal. There is tenderness in the suprapubic area. There is no rebound, no guarding and no CVA tenderness.  Musculoskeletal: She exhibits no edema.  Psychiatric: She has a normal mood and affect.  Nursing note and vitals reviewed.   BP 132/80 (BP Location: Left Arm, Patient Position: Sitting, Cuff Size: Large)   Pulse 60   Ht 5\' 8"  (1.727 m)   Wt 283 lb (128.4 kg)   SpO2 100%   BMI 43.03 kg/m   Assessment and Plan: 1. Acute cystitis with hematuria - POC urinalysis w microscopic (non auto) - fosfomycin (MONUROL) 3 g PACK; Take 3 g by mouth once for 1 dose.  Dispense: 3 g; Refill: 0  2. Essential (primary) hypertension Controlled, continue bystolic  3. Pre-diabetes Continue diet, regular exercise, weight loss efforts - Hemoglobin A1c   Partially dictated using Editor, commissioning. Any errors are unintentional.  Halina Maidens, MD [facility]  03/14/2018

## 2018-03-15 LAB — HEMOGLOBIN A1C
Est. average glucose Bld gHb Est-mCnc: 126 mg/dL
Hgb A1c MFr Bld: 6 % — ABNORMAL HIGH (ref 4.8–5.6)

## 2018-03-17 NOTE — Progress Notes (Signed)
Patient informed. 

## 2018-03-24 ENCOUNTER — Ambulatory Visit: Payer: PRIVATE HEALTH INSURANCE | Admitting: Internal Medicine

## 2018-03-25 ENCOUNTER — Encounter: Payer: Self-pay | Admitting: Internal Medicine

## 2018-03-25 ENCOUNTER — Ambulatory Visit (INDEPENDENT_AMBULATORY_CARE_PROVIDER_SITE_OTHER): Payer: PRIVATE HEALTH INSURANCE | Admitting: Internal Medicine

## 2018-03-25 VITALS — BP 122/76 | HR 50 | Ht 68.0 in | Wt 283.0 lb

## 2018-03-25 DIAGNOSIS — N390 Urinary tract infection, site not specified: Secondary | ICD-10-CM

## 2018-03-25 LAB — POCT URINALYSIS DIPSTICK
Bilirubin, UA: NEGATIVE
Blood, UA: NEGATIVE
Glucose, UA: NEGATIVE
Ketones, UA: NEGATIVE
Leukocytes, UA: NEGATIVE
NITRITE UA: NEGATIVE
PH UA: 6 (ref 5.0–8.0)
Protein, UA: NEGATIVE
Spec Grav, UA: <=1.005 — AB (ref 1.010–1.025)
Urobilinogen, UA: 0.2 E.U./dL

## 2018-03-25 NOTE — Progress Notes (Signed)
Date:  03/25/2018   Name:  Courtney Robinson   DOB:  10-26-1955   MRN:  [id number]   Chief Complaint: Urinary Tract Infection (Wants to recheck urine to be sure gone. Fatigue is much better. Patient feels abx caused her to feel tired but she completed all the medication. )  HPI  Review of Systems  Constitutional: Positive for fatigue (resolved 99.9%). Negative for chills, diaphoresis, fever and unexpected weight change.  Respiratory: Negative for cough, chest tightness, shortness of breath and wheezing.   Cardiovascular: Negative for chest pain and palpitations.  Gastrointestinal: Negative for abdominal pain, constipation and diarrhea.  Genitourinary: Negative for dysuria, frequency, hematuria and urgency.  Neurological: Negative for dizziness and headaches.  Hematological: Negative for adenopathy. Does not bruise/bleed easily.    Patient Active Problem List   Diagnosis Date Noted  . Arthralgia of left temporomandibular joint 10/15/2017  . Pre-diabetes 08/16/2017  . Environmental and seasonal allergies 07/20/2016  . 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 Left knee pain 08/12/2015  . History of palpitations 08/12/2015  . Essential (primary) hypertension 01/17/2015  . Arthralgia of hand 01/17/2015  . Adult BMI 40.0-44.9 kg/sq m ([place]) 12/30/2009    Allergies  Allergen Reactions  . Hydrochlorothiazide Shortness Of Breath, Swelling and Other (See Comments)    Patient states throat swelled up, felt SOB, chest pain, and tingling in arms and shoulders.  . Amlodipine   . Hydrocodone   . Codeine Nausea And Vomiting  . Lisinopril Cough  . Loratadine Palpitations  . Metoprolol Other (See Comments)    bradycardia  . Olmesartan Rash    Past Surgical History:  Procedure Laterality Date  . CHOLECYSTECTOMY  2013  . 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 Smoking status:  Never Smoker  . Smokeless tobacco: Never Used  Substance Use Topics  . Alcohol use: No    Alcohol/week: 0.0 standard drinks  . Drug use: No     Medication list has been reviewed and updated.  Current Meds  Medication Sig  . Ascorbic Acid (VITAMIN C) 1000 MG tablet Take 1,000 mg by mouth daily.  . D-Mannose POWD Take by mouth.  Courtney Robinson Leaf POWD by Does not apply route.  . lactobacillus acidophilus (BACID) TABS tablet Take 2 tablets by mouth 3 (three) times daily.  . nebivolol (BYSTOLIC) 5 MG tablet Take 1 tablet (5 mg total) by mouth daily.  Marland Kitchen UNABLE TO FIND A-F Betafood    PHQ 2/9 Scores 08/13/2017 04/23/2017 08/18/2015  PHQ - 2 Score 0 0 0    Physical Exam Vitals signs and nursing note reviewed.  Constitutional:      General: She is not in acute distress.    Appearance: She is well-developed.  HENT:     Head: Normocephalic and atraumatic.     Mouth/Throat:     Mouth: Mucous membranes are moist.  Eyes:     Conjunctiva/sclera: Conjunctivae normal.  Cardiovascular:     Rate and Rhythm: Normal rate and regular rhythm.  Pulmonary:     Effort: Pulmonary effort is normal. No respiratory distress.     Breath sounds: Normal breath sounds.  Abdominal:     General: Bowel sounds are normal.     Tenderness: There is no abdominal tenderness.  Musculoskeletal: Normal range of motion.  Skin:    General: Skin is warm and dry.     Findings:  No rash.  Neurological:     Mental Status: She is alert and oriented to person, place, and time.  Psychiatric:        Behavior: Behavior normal.        Thought Content: Thought content normal.    Wt Readings from Last 3 Encounters:  03/25/18 283 lb (128.4 kg)  03/14/18 283 lb (128.4 kg)  01/30/18 280 lb (127 kg)    BP 122/76 (BP Location: Right Arm, Patient Position: Sitting, Cuff Size: Large)   Pulse (!) 50   Ht 5\' 8"  (1.727 m)   Wt 283 lb (128.4 kg)   SpO2 97%   BMI 43.03 kg/m   Assessment and Plan: 1. Recurrent  UTI Resolved Side effect to antibiotic essentially resolved Return if needed - POCT Urinalysis Dipstick   Partially dictated using Editor, commissioning. Any errors are unintentional.  Halina Maidens, MD [facility]  03/25/2018

## 2018-06-02 ENCOUNTER — Telehealth: Payer: Self-pay | Admitting: Internal Medicine

## 2018-06-02 NOTE — Telephone Encounter (Signed)
Pt has a form that she wants to drop, "prescription referral for massage therapy services"  Wants to have it filled out for her to receive services. I believe she has left you a voicemail, please advise.

## 2018-06-02 NOTE — Telephone Encounter (Signed)
Spoke with pt. This is something her [employer] is paying for. Will bring form tomorrow. Told her I will call her and let her know if this is something Dr Army Melia will agree too.

## 2018-08-18 ENCOUNTER — Other Ambulatory Visit: Payer: Self-pay | Admitting: Internal Medicine

## 2018-08-18 DIAGNOSIS — Z1231 Encounter for screening mammogram for malignant neoplasm of breast: Secondary | ICD-10-CM

## 2018-08-20 ENCOUNTER — Encounter: Payer: PRIVATE HEALTH INSURANCE | Admitting: Internal Medicine

## 2018-11-06 ENCOUNTER — Other Ambulatory Visit: Payer: Self-pay | Admitting: Internal Medicine

## 2018-11-06 DIAGNOSIS — I10 Essential (primary) hypertension: Secondary | ICD-10-CM

## 2018-12-08 ENCOUNTER — Other Ambulatory Visit: Payer: Self-pay

## 2018-12-08 ENCOUNTER — Ambulatory Visit
Admission: RE | Admit: 2018-12-08 | Discharge: 2018-12-08 | Disposition: A | Discharge status: Home / Self Care | Payer: PRIVATE HEALTH INSURANCE | Source: Ambulatory Visit | Attending: Internal Medicine | Admitting: Internal Medicine

## 2018-12-08 DIAGNOSIS — Z1231 Encounter for screening mammogram for malignant neoplasm of breast: Secondary | ICD-10-CM | POA: Diagnosis present

## 2018-12-10 ENCOUNTER — Other Ambulatory Visit: Payer: Self-pay

## 2018-12-10 ENCOUNTER — Encounter: Payer: Self-pay | Admitting: Internal Medicine

## 2018-12-10 ENCOUNTER — Ambulatory Visit (INDEPENDENT_AMBULATORY_CARE_PROVIDER_SITE_OTHER): Payer: PRIVATE HEALTH INSURANCE | Admitting: Internal Medicine

## 2018-12-10 VITALS — BP 138/78 | HR 58 | Ht 68.0 in | Wt 279.0 lb

## 2018-12-10 DIAGNOSIS — R7303 Prediabetes: Secondary | ICD-10-CM

## 2018-12-10 DIAGNOSIS — Z0000 Encounter for general adult medical examination without abnormal findings: Secondary | ICD-10-CM

## 2018-12-10 DIAGNOSIS — I10 Essential (primary) hypertension: Secondary | ICD-10-CM

## 2018-12-10 DIAGNOSIS — Z23 Encounter for immunization: Secondary | ICD-10-CM | POA: Diagnosis not present

## 2018-12-10 LAB — POCT URINALYSIS DIPSTICK
Bilirubin, UA: NEGATIVE
Blood, UA: NEGATIVE
Glucose, UA: NEGATIVE
Ketones, UA: NEGATIVE
Nitrite, UA: NEGATIVE
Protein, UA: NEGATIVE
Spec Grav, UA: 1.01 (ref 1.010–1.025)
Urobilinogen, UA: 0.2 E.U./dL
pH, UA: 7 (ref 5.0–8.0)

## 2018-12-10 NOTE — Progress Notes (Signed)
Date:  12/10/2018   Name:  Courtney Robinson   DOB:  1956-03-08   MRN:  [id number]   Chief Complaint: Annual Exam (no pap), influenza vacc need, and TDAP Courtney Robinson is a 63 y.o. female who presents today for her Complete Annual Exam. She feels fairly well. She is going through extensive dental work.  She reports exercising intermittently. She reports she is sleeping well. She denies breast complaints.  Mammogram 11/2018 Colonoscopy 2012 Pap 2018 - neg with cotesting  Hypertension This is a chronic problem. The problem is controlled (BP at home controlled). Pertinent negatives include no chest pain, headaches, palpitations or shortness of breath. Past treatments include beta blockers. The current treatment provides significant improvement. Compliance problems include exercise.   Diabetes She presents for her follow-up diabetic visit. Diabetes type: pre-diabetes. Her disease course has been stable. Pertinent negatives for hypoglycemia include no dizziness, headaches, nervousness/anxiousness or tremors. Pertinent negatives for diabetes include no chest pain, no fatigue, no polydipsia and no polyuria. She is compliant with treatment most of the time. She is following a generally healthy diet. She participates in exercise intermittently.   Lab Results  Component Value Date   HGBA1C 6.0 (H) 03/14/2018   Lab Results  Component Value Date   CHOL 195 08/13/2017   HDL 62 08/13/2017   LDLCALC 110 (H) 08/13/2017   TRIG 115 08/13/2017   CHOLHDL 3.1 08/13/2017   Lab Results  Component Value Date   WBC 6.8 08/13/2017   HGB 13.8 08/13/2017   HCT 41.8 08/13/2017   MCV 91 08/13/2017   PLT 252 08/13/2017     Review of Systems  Constitutional: Negative for chills, fatigue and fever.  HENT: Positive for dental problem (on antibiotics for tooth). Negative for congestion, hearing loss, tinnitus, trouble swallowing and voice change.   Eyes: Negative for visual disturbance.   Respiratory: Negative for cough, chest tightness, shortness of breath and wheezing.   Cardiovascular: Negative for chest pain, palpitations and leg swelling.  Gastrointestinal: Negative for abdominal pain, constipation, diarrhea and vomiting.  Endocrine: Negative for polydipsia and polyuria.  Genitourinary: Negative for dysuria, frequency, genital sores, vaginal bleeding and vaginal discharge.  Musculoskeletal: Negative for arthralgias, gait problem and joint swelling.  Skin: Negative for color change and rash.  Neurological: Negative for dizziness, tremors, light-headedness and headaches.  Hematological: Negative for adenopathy. Does not bruise/bleed easily.  Psychiatric/Behavioral: Negative for dysphoric mood and sleep disturbance. The patient is not nervous/anxious.     Patient Active Problem List   Diagnosis Date Noted  . Arthralgia of left temporomandibular joint 10/15/2017  . Pre-diabetes 08/16/2017  . Environmental and seasonal allergies 07/20/2016  . 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 Left knee pain 08/12/2015  . History of palpitations 08/12/2015  . Essential (primary) hypertension 01/17/2015  . Arthralgia of hand 01/17/2015  . Adult BMI 40.0-44.9 kg/sq m ([place]) 12/30/2009    Allergies  Allergen Reactions  . Hydrochlorothiazide Shortness Of Breath, Swelling and Other (See Comments)    Patient states throat swelled up, felt SOB, chest pain, and tingling in arms and shoulders.  . Amlodipine   . Hydrocodone   . Codeine Nausea And Vomiting  . Lisinopril Cough  . Loratadine Palpitations  . Metoprolol Other (See Comments)    bradycardia  . Olmesartan Rash    Past Surgical History:  Procedure Laterality Date  . CHOLECYSTECTOMY  2013  . ENDOMETRIAL BIOPSY  11/16/09   Disordered Proliferative Endometrium, negative for hyperplasia, atypia or malignancy  Social History   Tobacco Use  . Smoking status: Never Smoker  . Smokeless tobacco: Never Used  Substance Use Topics  .  Alcohol use: No    Alcohol/week: 0.0 standard drinks  . Drug use: No     Medication list has been reviewed and updated.  Current Meds  Medication Sig  . Ascorbic Acid (VITAMIN C) 1000 MG tablet Take 1,000 mg by mouth daily.  Marland Kitchen azithromycin (ZITHROMAX) 250 MG tablet Take by mouth daily.  Marland Kitchen BYSTOLIC 5 MG tablet TAKE 1 TABLET BY MOUTH EVERY DAY  . D-Mannose POWD Take by mouth.  Haydee Salter Leaf POWD by Does not apply route.  . lactobacillus acidophilus (BACID) TABS tablet Take 2 tablets by mouth 3 (three) times daily.  Marland Kitchen UNABLE TO FIND A-F Betafood    PHQ 2/9 Scores 12/10/2018 08/13/2017 04/23/2017 08/18/2015  PHQ - 2 Score 0 0 0 0  PHQ- 9 Score 0 - - -    BP Readings from Last 3 Encounters:  12/10/18 138/78  03/25/18 122/76  03/14/18 132/80    Physical Exam Vitals signs and nursing note reviewed.  Constitutional:      General: She is not in acute distress.    Appearance: She is well-developed.  HENT:     Head: Normocephalic and atraumatic.     Right Ear: Tympanic membrane and ear canal normal.     Left Ear: Tympanic membrane and ear canal normal.     Nose:     Right Sinus: No maxillary sinus tenderness.     Left Sinus: No maxillary sinus tenderness.  Eyes:     General: No scleral icterus.       Right eye: No discharge.        Left eye: No discharge.     Conjunctiva/sclera: Conjunctivae normal.  Neck:     Musculoskeletal: Normal range of motion. No erythema.     Thyroid: No thyromegaly.     Vascular: No carotid bruit.  Cardiovascular:     Rate and Rhythm: Normal rate and regular rhythm.     Pulses: Normal pulses.     Heart sounds: Normal heart sounds.  Pulmonary:     Effort: Pulmonary effort is normal. No respiratory distress.     Breath sounds: No wheezing.  Chest:     Breasts:        Right: No mass, nipple discharge, skin change or tenderness.        Left: No mass, nipple discharge, skin change or tenderness.  Abdominal:     General: Bowel sounds are  normal.     Palpations: Abdomen is soft.     Tenderness: There is no abdominal tenderness.  Musculoskeletal: Normal range of motion.  Lymphadenopathy:     Cervical: No cervical adenopathy.  Skin:    General: Skin is warm and dry.     Capillary Refill: Capillary refill takes less than 2 seconds.     Findings: No rash.  Neurological:     General: No focal deficit present.     Mental Status: She is alert and oriented to person, place, and time.     Cranial Nerves: No cranial nerve deficit.     Sensory: No sensory deficit.     Deep Tendon Reflexes: Reflexes are normal and symmetric.  Psychiatric:        Speech: Speech normal.        Behavior: Behavior normal.        Thought Content: Thought content normal.  Wt Readings from Last 3 Encounters:  12/10/18 279 lb (126.6 kg)  03/25/18 283 lb (128.4 kg)  03/14/18 283 lb (128.4 kg)    BP 138/78   Pulse (!) 58   Ht 5\' 8"  (1.727 m)   Wt 279 lb (126.6 kg)   SpO2 98%   BMI 42.42 kg/m   Assessment and Plan: 1. Annual physical exam Normal exam except for weight Pt will return in 4 mo for Shingrix #1 - Lipid panel - POCT urinalysis dipstick  2. Essential (primary) hypertension Clinically stable exam with well controlled BP.   Tolerating medications, Bystolic, without side effects at this time. Pt to continue current regimen and low sodium diet; benefits of regular exercise as able discussed. - CBC with Differential/Platelet - Comprehensive metabolic panel - TSH  3. Pre-diabetes She is trying to follow a healthy diet, has lost a few pounds but needs to do more Discussed transition to DM - best prevented by weight loss - Hemoglobin A1c  4. Need for diphtheria-tetanus-pertussis (Tdap) vaccine - Tdap vaccine greater than or equal to 7yo IM  5. Need for influenza vaccination Given today   Partially dictated using Editor, commissioning. Any errors are unintentional.  Halina Maidens, MD [facility]  12/10/2018

## 2018-12-10 NOTE — Patient Instructions (Signed)
https://www.cdc.gov/vaccines/hcp/vis/vis-statements/tdap.pdf">  Tdap Vaccine (Tetanus, Diphtheria and Pertussis): What You Need to Know 1. Why get vaccinated? Tetanus, diphtheria and pertussis are very serious diseases. Tdap vaccine can protect us from these diseases. And, Tdap vaccine given to pregnant women can protect newborn babies against pertussis.. TETANUS (Lockjaw) is rare in the [place] today. It causes painful muscle tightening and stiffness, usually all over the body.  It can lead to tightening of muscles in the head and neck so you can't open your mouth, swallow, or sometimes even breathe. Tetanus kills about 1 out of 10 people who are infected even after receiving the best medical care. DIPHTHERIA is also rare in the [place] today. It can cause a thick coating to form in the back of the throat.  It can lead to breathing problems, heart failure, paralysis, and death. PERTUSSIS (Whooping Cough) causes severe coughing spells, which can cause difficulty breathing, vomiting and disturbed sleep.  It can also lead to weight loss, incontinence, and rib fractures. Up to 2 in 100 adolescents and 5 in 100 adults with pertussis are hospitalized or have complications, which could include pneumonia or death. These diseases are caused by bacteria. Diphtheria and pertussis are spread from person to person through secretions from coughing or sneezing. Tetanus enters the body through cuts, scratches, or wounds. Before vaccines, as many as 200,000 cases of diphtheria, 200,000 cases of pertussis, and hundreds of cases of tetanus, were reported in the [place] each year. Since vaccination began, reports of cases for tetanus and diphtheria have dropped by about 99% and for pertussis by about 80%. 2. Tdap vaccine Tdap vaccine can protect adolescents and adults from tetanus, diphtheria, and pertussis. One dose of Tdap is routinely given at age 11 or 12. People who did not get Tdap at that age  should get it as soon as possible. Tdap is especially important for healthcare professionals and anyone having close contact with a baby younger than 12 months. Pregnant women should get a dose of Tdap during every pregnancy, to protect the newborn from pertussis. Infants are most at risk for severe, life-threatening complications from pertussis. Another vaccine, called Td, protects against tetanus and diphtheria, but not pertussis. A Td booster should be given every 10 years. Tdap may be given as one of these boosters if you have never gotten Tdap before. Tdap may also be given after a severe cut or burn to prevent tetanus infection. Your doctor or the person giving you the vaccine can give you more information. Tdap may safely be given at the same time as other vaccines. 3. Some people should not get this vaccine  A person who has ever had a life-threatening allergic reaction after a previous dose of any diphtheria, tetanus or pertussis containing vaccine, OR has a severe allergy to any part of this vaccine, should not get Tdap vaccine. Tell the person giving the vaccine about any severe allergies.  Anyone who had coma or long repeated seizures within 7 days after a childhood dose of DTP or DTaP, or a previous dose of Tdap, should not get Tdap, unless a cause other than the vaccine was found. They can still get Td.  Talk to your doctor if you: ? have seizures or another nervous system problem, ? had severe pain or swelling after any vaccine containing diphtheria, tetanus or pertussis, ? ever had a condition called Guillain-Barr Syndrome (GBS), ? aren't feeling well on the day the shot is scheduled. 4. Risks With any medicine, including vaccines,   there is a chance of side effects. These are usually mild and go away on their own. Serious reactions are also possible but are rare. Most people who get Tdap vaccine do not have any problems with it. Mild problems following Tdap (Did not interfere  with activities)  Pain where the shot was given (about 3 in 4 adolescents or 2 in 3 adults)  Redness or swelling where the shot was given (about 1 person in 5)  Mild fever of at least 100.4F (up to about 1 in 25 adolescents or 1 in 100 adults)  Headache (about 3 or 4 people in 10)  Tiredness (about 1 person in 3 or 4)  Nausea, vomiting, diarrhea, stomach ache (up to 1 in 4 adolescents or 1 in 10 adults)  Chills, sore joints (about 1 person in 10)  Body aches (about 1 person in 3 or 4)  Rash, swollen glands (uncommon) Moderate problems following Tdap (Interfered with activities, but did not require medical attention)  Pain where the shot was given (up to 1 in 5 or 6)  Redness or swelling where the shot was given (up to about 1 in 16 adolescents or 1 in 12 adults)  Fever over 102F (about 1 in 100 adolescents or 1 in 250 adults)  Headache (about 1 in 7 adolescents or 1 in 10 adults)  Nausea, vomiting, diarrhea, stomach ache (up to 1 or 3 people in 100)  Swelling of the entire arm where the shot was given (up to about 1 in 500). Severe problems following Tdap (Unable to perform usual activities; required medical attention)  Swelling, severe pain, bleeding and redness in the arm where the shot was given (rare). Problems that could happen after any vaccine:  People sometimes faint after a medical procedure, including vaccination. Sitting or lying down for about 15 minutes can help prevent fainting, and injuries caused by a fall. Tell your doctor if you feel dizzy, or have vision changes or ringing in the ears.  Some people get severe pain in the shoulder and have difficulty moving the arm where a shot was given. This happens very rarely.  Any medication can cause a severe allergic reaction. Such reactions from a vaccine are very rare, estimated at fewer than 1 in a million doses, and would happen within a few minutes to a few hours after the vaccination. As with any medicine,  there is a very remote chance of a vaccine causing a serious injury or death. The safety of vaccines is always being monitored. For more information, visit: www.cdc.gov/vaccinesafety/ 5. What if there is a serious problem? What should I look for?  Look for anything that concerns you, such as signs of a severe allergic reaction, very high fever, or unusual behavior. Signs of a severe allergic reaction can include hives, swelling of the face and throat, difficulty breathing, a fast heartbeat, dizziness, and weakness. These would usually start a few minutes to a few hours after the vaccination. What should I do?  If you think it is a severe allergic reaction or other emergency that can't wait, call 9-1-1 or get the person to the nearest hospital. Otherwise, call your doctor.  Afterward, the reaction should be reported to the Vaccine Adverse Event Reporting System (VAERS). Your doctor might file this report, or you can do it yourself through the VAERS web site at www.vaers.hhs.gov, or by calling 1-800-822-7967. VAERS does not give medical advice. 6. The National Vaccine Injury Compensation Program The National Vaccine Injury Compensation Program (  VICP) is a federal program that was created to compensate people who may have been injured by certain vaccines. Persons who believe they may have been injured by a vaccine can learn about the program and about filing a claim by calling 1-800-338-2382 or visiting the VICP website at www.hrsa.gov/vaccinecompensation. There is a time limit to file a claim for compensation. 7. How can I learn more?  Ask your doctor. He or she can give you the vaccine package insert or suggest other sources of information.  Call your local or state health department.  Contact the Centers for Disease Control and Prevention (CDC): ? Call 1-800-232-4636 (1-800-CDC-INFO) or ? Visit CDC's website at www.cdc.gov/vaccines Vaccine Information Statement Tdap Vaccine (06/02/2013) This  information is not intended to replace advice given to you by your health care provider. Make sure you discuss any questions you have with your health care provider. Document Released: 09/25/2011 Document Revised: 11/11/2017 Document Reviewed: 11/11/2017 Elsevier Interactive Patient Education  2020 Elsevier Inc.  

## 2018-12-11 LAB — HEMOGLOBIN A1C
Est. average glucose Bld gHb Est-mCnc: 123 mg/dL
Hgb A1c MFr Bld: 5.9 % — ABNORMAL HIGH (ref 4.8–5.6)

## 2018-12-11 LAB — COMPREHENSIVE METABOLIC PANEL
ALT: 9 IU/L (ref 0–32)
AST: 14 IU/L (ref 0–40)
Albumin/Globulin Ratio: 1.3 (ref 1.2–2.2)
Albumin: 4.3 g/dL (ref 3.8–4.8)
Alkaline Phosphatase: 80 IU/L (ref 39–117)
BUN/Creatinine Ratio: 15 (ref 12–28)
BUN: 15 mg/dL (ref 8–27)
Bilirubin Total: 0.2 mg/dL (ref 0.0–1.2)
CO2: 24 mmol/L (ref 20–29)
Calcium: 9.5 mg/dL (ref 8.7–10.3)
Chloride: 104 mmol/L (ref 96–106)
Creatinine, Ser: 1.02 mg/dL — ABNORMAL HIGH (ref 0.57–1.00)
GFR calc Af Amer: 68 mL/min/{1.73_m2} (ref >59)
GFR calc non Af Amer: 59 mL/min/{1.73_m2} — ABNORMAL LOW (ref >59)
Globulin, Total: 3.3 g/dL (ref 1.5–4.5)
Glucose: 102 mg/dL — ABNORMAL HIGH (ref 65–99)
Potassium: 4.7 mmol/L (ref 3.5–5.2)
Sodium: 139 mmol/L (ref 134–144)
Total Protein: 7.6 g/dL (ref 6.0–8.5)

## 2018-12-11 LAB — CBC WITH DIFFERENTIAL/PLATELET
Basophils Absolute: 0.1 10*3/uL (ref 0.0–0.2)
Basos: 1 %
EOS (ABSOLUTE): 0.1 10*3/uL (ref 0.0–0.4)
Eos: 2 %
Hematocrit: 43.2 % (ref 34.0–46.6)
Hemoglobin: 13.6 g/dL (ref 11.1–15.9)
Immature Grans (Abs): 0 10*3/uL (ref 0.0–0.1)
Immature Granulocytes: 0 %
Lymphocytes Absolute: 1.9 10*3/uL (ref 0.7–3.1)
Lymphs: 31 %
MCH: 29.6 pg (ref 26.6–33.0)
MCHC: 31.5 g/dL (ref 31.5–35.7)
MCV: 94 fL (ref 79–97)
Monocytes Absolute: 0.4 10*3/uL (ref 0.1–0.9)
Monocytes: 7 %
Neutrophils Absolute: 3.6 10*3/uL (ref 1.4–7.0)
Neutrophils: 59 %
Platelets: 230 10*3/uL (ref 150–450)
RBC: 4.59 x10E6/uL (ref 3.77–5.28)
RDW: 13 % (ref 11.7–15.4)
WBC: 6.1 10*3/uL (ref 3.4–10.8)

## 2018-12-11 LAB — LIPID PANEL
Chol/HDL Ratio: 3.2 ratio (ref 0.0–4.4)
Cholesterol, Total: 192 mg/dL (ref 100–199)
HDL: 60 mg/dL (ref >39)
LDL Chol Calc (NIH): 108 mg/dL — ABNORMAL HIGH (ref 0–99)
Triglycerides: 137 mg/dL (ref 0–149)
VLDL Cholesterol Cal: 24 mg/dL (ref 5–40)

## 2018-12-11 LAB — TSH: TSH: 1.58 u[IU]/mL (ref 0.450–4.500)

## 2019-03-31 ENCOUNTER — Ambulatory Visit: Payer: PRIVATE HEALTH INSURANCE

## 2019-04-08 ENCOUNTER — Ambulatory Visit (INDEPENDENT_AMBULATORY_CARE_PROVIDER_SITE_OTHER): Payer: PRIVATE HEALTH INSURANCE

## 2019-04-08 DIAGNOSIS — Z23 Encounter for immunization: Secondary | ICD-10-CM | POA: Diagnosis not present

## 2019-04-08 NOTE — Patient Instructions (Signed)
Live Zoster (Shingles) Vaccine: What You Need to Know 1. Why get vaccinated? Live zoster (shingles) vaccine can prevent shingles. Shingles (also called herpes zoster, or just zoster) is a painful skin rash, usually with blisters. In addition to the rash, shingles can cause fever, headache, chills, or upset stomach. More rarely, shingles can lead to pneumonia, hearing problems, blindness, brain inflammation (encephalitis), or death.  The most common complication of shingles is long-term nerve pain called postherpetic neuralgia (PHN). PHN occurs in the areas where the shingles rash was, even after the rash clears up. It can last for months or years after the rash goes away. The pain from PHN can be severe and debilitating. About 10 to 18% of people who get shingles will experience PHN. The risk of PHN increases with age. An older adult with shingles is more likely to develop PHN and have longer lasting and more severe pain than a younger person with shingles.  Shingles is caused by the varicella zoster virus, the same virus that causes chickenpox. After you have chickenpox, the virus stays in your body and can cause shingles later in life. Shingles cannot be passed from one person to another, but the virus that causes shingles can spread and cause chickenpox in someone who had never had chickenpox or received chickenpox vaccine. 2. Live shingles vaccine Live shingles vaccine can provide protection against shingles and PHN.  Another type of shingles vaccine, recombinant shingles vaccine, is the preferred vaccine for the prevention of shingles. However, live shingles vaccine may be used in some circumstances (for example if a person is allergic to recombinant shingles vaccine or prefers live shingles vaccine, or if recombinant shingles vaccine is not available). Adults 60 years and older who get live shingles vaccine should receive 1 dose, administered by injection. Shingles vaccine may be given at the same  time as other vaccines. 3. Talk with your health care provider Tell your vaccine provider if the person getting the vaccine:  Has had an allergic reaction after a previous dose of live shingles vaccine or varicella vaccine, or has any severe, life-threatening allergies.  Has a weakened immune system.  Is pregnant or thinks she might be pregnant.  Is currently experiencing an episode of shingles. In some cases, your health care provider may decide to postpone shingles vaccination to a future visit.  People with minor illnesses, such as a cold, may be vaccinated. People who are moderately or severely ill should usually wait until they recover before getting live shingles vaccine.  Your health care provider can give you more information. 4. Risks of a vaccine reaction  Redness, soreness, swelling, or itching at the site of the injection and headache can happen after live shingles vaccine. Rarely, live shingles vaccine can cause rash or shingles.  People sometimes faint after medical procedures, including vaccination. Tell your provider if you feel dizzy or have vision changes or ringing in the ears.  As with any medicine, there is a very remote chance of a vaccine causing a severe allergic reaction, other serious injury, or death. 5. What if there is a serious problem? An allergic reaction could occur after the vaccinated person leaves the clinic. If you see signs of a severe allergic reaction (hives, swelling of the face and throat, difficulty breathing, a fast heartbeat, dizziness, or weakness), call 9-1-1 and get the person to the nearest hospital.  For other signs that concern you, call your health care provider.  Adverse reactions should be reported to the Vaccine Adverse   Event Reporting System (VAERS). Your health care provider will usually file this report, or you can do it yourself. Visit the VAERS website at www.vaers.SamedayNews.es or call 779-666-8446. VAERS is only for reporting reactions,  and VAERS staff do not give medical advice. 6. How can I learn more?  Ask your health care provider.  Call your local or state health department.  Contact the Centers for Disease Control and Prevention (CDC): ? Call 417-815-8343 (1-800-CDC-INFO) or ? Visit CDC's website at http://hunter.com/ CDC Vaccine Information Statement Live Zoster Vaccine (02/05/2018) This information is not intended to replace advice given to you by your health care provider. Make sure you discuss any questions you have with your health care provider. Document Released: 01/21/2006 Document Revised: 07/15/2018 Document Reviewed: 11/05/2017 Elsevier Patient Education  2020 [employer].

## 2019-05-25 ENCOUNTER — Telehealth: Payer: Self-pay

## 2019-05-25 NOTE — Telephone Encounter (Signed)
Key: [id number]  - PA Case ID: [id number]  Completed PA on [email or web address] for patient medication: Bystolic 5mg .   90 for 90 days.   PA sent to [facility] for review.  Awaiting outcome within in 3 days.

## 2019-05-26 NOTE — Telephone Encounter (Signed)
Medication has been APPROVED through 05/24/2020.  Patient informed.   CM

## 2019-06-11 ENCOUNTER — Encounter: Payer: Self-pay | Admitting: Internal Medicine

## 2019-06-11 ENCOUNTER — Ambulatory Visit: Payer: PRIVATE HEALTH INSURANCE | Admitting: Internal Medicine

## 2019-06-11 ENCOUNTER — Other Ambulatory Visit: Payer: Self-pay

## 2019-06-11 VITALS — BP 138/90 | HR 52 | Temp 97.0°F | Ht 68.0 in | Wt 281.0 lb

## 2019-06-11 DIAGNOSIS — K0889 Other specified disorders of teeth and supporting structures: Secondary | ICD-10-CM | POA: Diagnosis not present

## 2019-06-11 DIAGNOSIS — I10 Essential (primary) hypertension: Secondary | ICD-10-CM | POA: Diagnosis not present

## 2019-06-11 NOTE — Patient Instructions (Addendum)
Covid Vaccine locations: [employer] Dept. [place]  [email or web address]  You need to get the second Shingrix vaccine by the end of June.

## 2019-06-11 NOTE — Progress Notes (Signed)
Date:  06/11/2019   Name:  Courtney Robinson   DOB:  [birth year]   MRN:  [id number]   Chief Complaint: Hypertension and Vaginal Pain (This morning she had two shooting pains in her vagina and then it went away. Only lasted second. )  Hypertension This is a chronic problem. The problem is controlled. Pertinent negatives include no chest pain, headaches or shortness of breath. Past treatments include beta blockers. The current treatment provides significant improvement.   Dental pain - struggling with ongoing dental pain and sharp stabbing pain on left side after tooth extractions.  She can not take gabapentin.  She is wondering about [employer].  She continues with chiropractic care, massage therapy and acupuncture which seen to provide some relief.  She is also take Advil every day.  Lab Results  Component Value Date   CREATININE 1.02 (H) 12/10/2018   BUN 15 12/10/2018   NA 139 12/10/2018   K 4.7 12/10/2018   CL 104 12/10/2018   CO2 24 12/10/2018   Lab Results  Component Value Date   CHOL 192 12/10/2018   HDL 60 12/10/2018   LDLCALC 108 (H) 12/10/2018   TRIG 137 12/10/2018   CHOLHDL 3.2 12/10/2018   Lab Results  Component Value Date   TSH 1.580 12/10/2018   Lab Results  Component Value Date   HGBA1C 5.9 (H) 12/10/2018     Review of Systems  Constitutional: Negative for chills, fatigue and fever.  HENT: Positive for dental problem.   Respiratory: Negative for cough, chest tightness and shortness of breath.   Cardiovascular: Negative for chest pain.  Gastrointestinal: Negative for abdominal pain, constipation and diarrhea.  Genitourinary: Positive for vaginal pain (one episode of sharp pain). Negative for dysuria and vaginal bleeding.  Neurological: Negative for dizziness, light-headedness and headaches.  Psychiatric/Behavioral: Negative for dysphoric mood and sleep disturbance.    Patient Active Problem List   Diagnosis Date Noted  . Arthralgia of left  temporomandibular joint 10/15/2017  . Pre-diabetes 08/16/2017  . Environmental and seasonal allergies 07/20/2016  . 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 Left knee pain 08/12/2015  . History of palpitations 08/12/2015  . Essential (primary) hypertension 01/17/2015  . Arthralgia of hand 01/17/2015  . Adult BMI 40.0-44.9 kg/sq m ([place]) 12/30/2009    Allergies  Allergen Reactions  . Hydrochlorothiazide Shortness Of Breath, Swelling and Other (See Comments)    Patient states throat swelled up, felt SOB, chest pain, and tingling in arms and shoulders.  . Amlodipine   . Hydrocodone   . Codeine Nausea And Vomiting  . Lisinopril Cough  . Loratadine Palpitations  . Metoprolol Other (See Comments)    bradycardia  . Olmesartan Rash    Past Surgical History:  Procedure Laterality Date  . CHOLECYSTECTOMY  2013  . 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 Smoking status: Never Smoker  . Smokeless tobacco: Never Used  Substance Use Topics  . Alcohol use: No    Alcohol/week: 0.0 standard drinks  . Drug use: No     Medication list has been reviewed and updated.  Current Meds  Medication Sig  . Ascorbic Acid (VITAMIN C) 1000 MG tablet Take 1,000 mg by mouth daily.  Marland Kitchen azithromycin (ZITHROMAX) 250 MG tablet Take by mouth daily.  Marland Kitchen BYSTOLIC 5 MG tablet TAKE 1 TABLET BY MOUTH EVERY DAY  . chlorhexidine (PERIDEX) 0.12 %  solution Use as directed 15 mLs in the mouth or throat 2 (two) times daily.  . D-Mannose POWD Take by mouth.  Haydee Salter Leaf POWD by Does not apply route.  . lactobacillus acidophilus (BACID) TABS tablet Take 2 tablets by mouth 3 (three) times daily.  . Lido-Menthol-Methyl Sal-Camph (CBD KINGS EX) Apply topically.  Marland Kitchen UNABLE TO FIND A-F Betafood    PHQ 2/9 Scores 06/11/2019 12/10/2018 08/13/2017 04/23/2017  PHQ - 2 Score 0 0 0 0  PHQ- 9 Score - 0 - -    BP  Readings from Last 3 Encounters:  06/11/19 138/90  12/10/18 138/78  03/25/18 122/76    Physical Exam Vitals and nursing note reviewed.  Constitutional:      General: She is not in acute distress.    Appearance: Normal appearance. She is well-developed.  HENT:     Head: Normocephalic and atraumatic.   Neck:     Thyroid: No thyroid mass or thyromegaly.  Cardiovascular:     Rate and Rhythm: Normal rate and regular rhythm.     Pulses: Normal pulses.     Heart sounds: No murmur.  Pulmonary:     Effort: Pulmonary effort is normal. No respiratory distress.     Breath sounds: No wheezing or rhonchi.  Musculoskeletal:     Cervical back: Normal range of motion. No spinous process tenderness.     Right lower leg: No edema.     Left lower leg: No edema.  Lymphadenopathy:     Cervical: No cervical adenopathy.     Right cervical: No superficial, deep or posterior cervical adenopathy.    Left cervical: No superficial, deep or posterior cervical adenopathy.  Skin:    General: Skin is warm and dry.     Findings: No rash.  Neurological:     Mental Status: She is alert and oriented to person, place, and time.  Psychiatric:        Behavior: Behavior normal.        Thought Content: Thought content normal.     Wt Readings from Last 3 Encounters:  06/11/19 281 lb (127.5 kg)  12/10/18 279 lb (126.6 kg)  03/25/18 283 lb (128.4 kg)    BP 138/90   Pulse (!) 52   Temp (!) 97 F (36.1 C) (Temporal)   Ht 5\' 8"  (1.727 m)   Wt 281 lb (127.5 kg)   SpO2 97%   BMI 42.73 kg/m   Assessment and Plan: 1. Essential (primary) hypertension Clinically stable exam with well controlled BP on Bystolic 5 mg. Tolerating medications without side effects at this time. Pt to continue current regimen and low sodium diet; benefits of regular exercise as able discussed.  2. Pain, dental Continue current management but try to cut back on Advil use as this may increase BP Can try [place] Consider  off label use of Nortriptyline or Tegretol   Partially dictated using Editor, commissioning. Any errors are unintentional.  Halina Maidens, MD [facility]  06/11/2019

## 2019-06-16 ENCOUNTER — Other Ambulatory Visit: Payer: Self-pay

## 2019-06-16 ENCOUNTER — Ambulatory Visit: Payer: PRIVATE HEALTH INSURANCE | Admitting: Internal Medicine

## 2019-06-16 ENCOUNTER — Encounter: Payer: Self-pay | Admitting: Internal Medicine

## 2019-06-16 VITALS — BP 136/82 | HR 70 | Temp 95.6°F | Ht 68.0 in | Wt 281.0 lb

## 2019-06-16 DIAGNOSIS — B9689 Other specified bacterial agents as the cause of diseases classified elsewhere: Secondary | ICD-10-CM

## 2019-06-16 DIAGNOSIS — N760 Acute vaginitis: Secondary | ICD-10-CM

## 2019-06-16 LAB — POCT WET PREP WITH KOH
KOH Prep POC: NEGATIVE
RBC Wet Prep HPF POC: 0
Trichomonas, UA: NEGATIVE
WBC Wet Prep HPF POC: 5
Yeast Wet Prep HPF POC: NEGATIVE

## 2019-06-16 MED ORDER — METRONIDAZOLE 500 MG PO TABS: 500.0000 mg | ORAL_TABLET | Freq: Two times a day (BID) | ORAL | 0 refills | Status: AC

## 2019-06-16 NOTE — Progress Notes (Signed)
Date:  06/16/2019   Name:  Courtney Robinson   DOB:  31-Jul-1955   MRN:  [id number]   Chief Complaint: Vaginal Pain (Sharp pain has happened 3 times ( electrical shooting ) in vagina. Chiropracter adjusted her hips to help. She has not had the pain since Saturday. Husband massaged her tailbones and found a spot between vagina and anus. Spot is shrinking but still concerned. )  Vaginal Pain The patient's pertinent negatives include no genital itching, genital odor, pelvic pain, vaginal bleeding or vaginal discharge. This is a recurrent problem. The problem has been unchanged. The pain is mild. Pertinent negatives include no chills or headaches.  She had two fairly traumatic vaginal deliveries.  One with a moderate tear and now has had some ongoing discomfort from time to time with intercourse. Husband noted a white patch near her rectum and took a picture.  She has no pain or itching and no history of rash or lesion.   Lab Results  Component Value Date   CREATININE 1.02 (H) 12/10/2018   BUN 15 12/10/2018   NA 139 12/10/2018   K 4.7 12/10/2018   CL 104 12/10/2018   CO2 24 12/10/2018   Lab Results  Component Value Date   CHOL 192 12/10/2018   HDL 60 12/10/2018   LDLCALC 108 (H) 12/10/2018   TRIG 137 12/10/2018   CHOLHDL 3.2 12/10/2018   Lab Results  Component Value Date   TSH 1.580 12/10/2018   Lab Results  Component Value Date   HGBA1C 5.9 (H) 12/10/2018     Review of Systems  Constitutional: Negative for chills and fatigue.  Genitourinary: Positive for dyspareunia (mild pain with thrusting of the anterior vagina) and vaginal pain. Negative for genital sores, pelvic pain, vaginal bleeding and vaginal discharge.  Neurological: Negative for dizziness and headaches.    Patient Active Problem List   Diagnosis Date Noted  . Arthralgia of left temporomandibular joint 10/15/2017  . Pre-diabetes 08/16/2017  . Environmental and seasonal allergies 07/20/2016  .  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 Left knee pain 08/12/2015  . History of palpitations 08/12/2015  . Essential (primary) hypertension 01/17/2015  . Arthralgia of hand 01/17/2015  . Adult BMI 40.0-44.9 kg/sq m ([place]) 12/30/2009    Allergies  Allergen Reactions  . Hydrochlorothiazide Shortness Of Breath, Swelling and Other (See Comments)    Patient states throat swelled up, felt SOB, chest pain, and tingling in arms and shoulders.  . Amlodipine   . Hydrocodone   . Codeine Nausea And Vomiting  . Lisinopril Cough  . Loratadine Palpitations  . Metoprolol Other (See Comments)    bradycardia  . Olmesartan Rash    Past Surgical History:  Procedure Laterality Date  . CHOLECYSTECTOMY  2013  . 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 Smoking status: Never Smoker  . Smokeless tobacco: Never Used  Substance Use Topics  . Alcohol use: No    Alcohol/week: 0.0 standard drinks  . Drug use: No     Medication list has been reviewed and updated.  Current Meds  Medication Sig  . Ascorbic Acid (VITAMIN C) 1000 MG tablet Take 1,000 mg by mouth daily.  Marland Kitchen BYSTOLIC 5 MG tablet TAKE 1 TABLET BY MOUTH EVERY DAY  . chlorhexidine (PERIDEX) 0.12 % solution Use as directed 15 mLs in the mouth or throat 2 (two) times daily.  . D-Mannose  POWD Take by mouth.  Haydee Salter Leaf POWD by Does not apply route.  . lactobacillus acidophilus (BACID) TABS tablet Take 2 tablets by mouth 3 (three) times daily.  . Lido-Menthol-Methyl Sal-Camph (CBD KINGS EX) Apply topically.  Marland Kitchen UNABLE TO FIND A-F Betafood    PHQ 2/9 Scores 06/16/2019 06/11/2019 12/10/2018 08/13/2017  PHQ - 2 Score 0 0 0 0  PHQ- 9 Score - - 0 -    BP Readings from Last 3 Encounters:  06/16/19 136/82  06/11/19 138/90  12/10/18 138/78    Physical Exam Vitals and nursing note reviewed.  Constitutional:      General: She is not in  acute distress.    Appearance: Normal appearance. She is well-developed.  HENT:     Head: Normocephalic and atraumatic.  Cardiovascular:     Rate and Rhythm: Normal rate and regular rhythm.  Pulmonary:     Effort: Pulmonary effort is normal. No respiratory distress.     Breath sounds: No wheezing or rhonchi.  Genitourinary:    Labia:        Right: No rash, tenderness or lesion.        Left: No rash, tenderness or lesion.      Urethra: No prolapse or urethral lesion.     Cervix: Normal.     Adnexa: Right adnexa normal and left adnexa normal.       Comments: Apparent thickening of the vaginal wall anteriorly near the vaginal opening.  No tenderness.  No bleeding. Vaginal mucosa with mild erythema c/w atrophy. Slight frothy white vaginal discharge Musculoskeletal:        General: Normal range of motion.  Skin:    General: Skin is warm and dry.     Findings: No rash.  Neurological:     Mental Status: She is alert and oriented to person, place, and time.  Psychiatric:        Behavior: Behavior normal.        Thought Content: Thought content normal.     Wt Readings from Last 3 Encounters:  06/16/19 281 lb (127.5 kg)  06/11/19 281 lb (127.5 kg)  12/10/18 279 lb (126.6 kg)    BP 136/82   Pulse 70   Temp (!) 95.6 F (35.3 C) (Oral)   Ht 5\' 8"  (1.727 m)   Wt 281 lb (127.5 kg)   SpO2 99%   BMI 42.73 kg/m   Assessment and Plan: 1. BV (bacterial vaginosis) Possibly causing intermittent vaginal pains May also contribute to mild dyspareunia If dyspareunia is worsening, would recommend GYN evaluation - POCT Wet Prep with KOH - metroNIDAZOLE (FLAGYL) 500 MG tablet; Take 1 tablet (500 mg total) by mouth 2 (two) times daily for 7 days.  Dispense: 14 tablet; Refill: 0   Partially dictated using Editor, commissioning. Any errors are unintentional.  Halina Maidens, MD [facility]  06/16/2019

## 2019-06-17 ENCOUNTER — Other Ambulatory Visit: Payer: Self-pay | Admitting: Internal Medicine

## 2019-06-17 ENCOUNTER — Telehealth: Payer: Self-pay | Admitting: Internal Medicine

## 2019-06-17 DIAGNOSIS — N760 Acute vaginitis: Secondary | ICD-10-CM

## 2019-06-17 DIAGNOSIS — B9689 Other specified bacterial agents as the cause of diseases classified elsewhere: Secondary | ICD-10-CM

## 2019-06-17 MED ORDER — METRONIDAZOLE 0.75 % VA GEL: 1.0000 | Freq: Two times a day (BID) | VAGINAL | 0 refills | Status: DC

## 2019-06-17 NOTE — Telephone Encounter (Signed)
Would like to have a cream called in, had issues with medication over night.   metroNIDAZOLE (FLAGYL) 500 MG tablet [id number]

## 2019-06-17 NOTE — Progress Notes (Unsigned)
m °

## 2019-06-26 ENCOUNTER — Ambulatory Visit: Payer: PRIVATE HEALTH INSURANCE

## 2019-07-02 ENCOUNTER — Other Ambulatory Visit: Payer: Self-pay

## 2019-07-02 DIAGNOSIS — K6289 Other specified diseases of anus and rectum: Secondary | ICD-10-CM

## 2019-07-13 LAB — HM PAP SMEAR

## 2019-07-23 ENCOUNTER — Other Ambulatory Visit: Payer: Self-pay

## 2019-07-23 ENCOUNTER — Ambulatory Visit
Admission: EM | Admit: 2019-07-23 | Discharge: 2019-07-23 | Disposition: A | Discharge status: Home / Self Care | Payer: PRIVATE HEALTH INSURANCE | Attending: Emergency Medicine | Admitting: Emergency Medicine

## 2019-07-23 DIAGNOSIS — I10 Essential (primary) hypertension: Secondary | ICD-10-CM

## 2019-07-23 DIAGNOSIS — R42 Dizziness and giddiness: Secondary | ICD-10-CM | POA: Diagnosis not present

## 2019-07-23 DIAGNOSIS — N390 Urinary tract infection, site not specified: Secondary | ICD-10-CM | POA: Diagnosis not present

## 2019-07-23 LAB — URINALYSIS, COMPLETE (UACMP) WITH MICROSCOPIC
Bilirubin Urine: NEGATIVE
Glucose, UA: NEGATIVE mg/dL
Hgb urine dipstick: NEGATIVE
Ketones, ur: NEGATIVE mg/dL
Nitrite: NEGATIVE
Protein, ur: NEGATIVE mg/dL
Specific Gravity, Urine: <1.005 — ABNORMAL LOW (ref 1.005–1.030)
pH: 6 (ref 5.0–8.0)

## 2019-07-23 MED ORDER — CEPHALEXIN 500 MG PO CAPS: 500.0000 mg | ORAL_CAPSULE | Freq: Two times a day (BID) | ORAL | 0 refills | Status: DC

## 2019-07-23 MED ORDER — FLUCONAZOLE 150 MG PO TABS: 150.0000 mg | ORAL_TABLET | Freq: Once | ORAL | 1 refills | Status: AC

## 2019-07-23 NOTE — ED Provider Notes (Signed)
HPI  SUBJECTIVE:  Courtney Robinson is a 64 y.o. female who presents with 2 episodes of seconds long lightheadedness today.  She describes the lightheadedness as "feeling off balance" as if I have "gotten up too quickly".  There are no aggravating or alleviating factors.  It resolves on its own.  It is not associated with positional changes, head rotation.  She has tried salt water baths, pushing fluids, eating, herbal teas and lying down.  States that she had a very stressful morning.  She was able to exercise this morning without any issues.  No nausea vomiting diarrhea fevers body aches headaches chest pain shortness of breath coughing wheezing abdominal pain rash.  No vertigo.  No palpitations syncope presyncope no change in medicines.  She is eating and drinking well.  Takes multiple supplements including dong quai and an essential clove oil but has been doing this for several weeks.  No new supplements.  States that she has had identical symptoms before was found to have a UTI on an ER evaluation.  Given fosfomycin.  Was diagnosed with presyncope and cystitis.  Urine culture ultimately grew out mixed urogenital flora.  She denies dysuria urinary urgency frequency cloudy odorous urine hematuria.  She has a past medical history of hypertension, states that she is compliant with her Bystolic, vertigo, Lyme disease, UTI.  No history of arrhythmia, coronary disease MI diabetes hypercholesterolemia smoking chronic kidney disease stroke aneurysm pyelonephritis.  [id number], Jesse Sans, MD   Past Medical History:  Diagnosis Date  . Allergy   . Anemia   . Displacement of intervertebral disc, site unspecified, without myelopathy   . Fibroadenoma of breast    right  . History of Lyme disease 2006/2007  . Spinal stenosis, lumbar region, without neurogenic claudication   . Vertigo     Past Surgical History:  Procedure Laterality Date  . CHOLECYSTECTOMY  2013  . ENDOMETRIAL BIOPSY  11/16/09    Disordered Proliferative Endometrium, negative for hyperplasia, atypia or malignancy    Family History  Problem Relation Age of Onset  . Breast cancer Sister   . Diabetes Mother   . Cancer Mother        pancreatic  . Hypertension Mother   . Cancer Sister        breast  . Diabetes Sister   . Asthma Sister     Social History   Tobacco Use  . Smoking status: Never Smoker  . Smokeless tobacco: Never Used  Substance Use Topics  . Alcohol use: No    Alcohol/week: 0.0 standard drinks  . Drug use: No    No current facility-administered medications for this encounter.  Current Outpatient Medications:  .  Ascorbic Acid (VITAMIN C) 1000 MG tablet, Take 1,000 mg by mouth daily., Disp: , Rfl:  .  BYSTOLIC 5 MG tablet, TAKE 1 TABLET BY MOUTH EVERY DAY, Disp: 90 tablet, Rfl: 4 .  chlorhexidine (PERIDEX) 0.12 % solution, Use as directed 15 mLs in the mouth or throat 2 (two) times daily., Disp: , Rfl:  .  D-Mannose POWD, Take by mouth., Disp: , Rfl:  .  Gymnema Sylvestris Leaf POWD, by Does not apply route., Disp: , Rfl:  .  lactobacillus acidophilus (BACID) TABS tablet, Take 2 tablets by mouth 3 (three) times daily., Disp: , Rfl:  .  Lido-Menthol-Methyl Sal-Camph (CBD KINGS EX), Apply topically., Disp: , Rfl:  .  metroNIDAZOLE (METROGEL VAGINAL) 0.75 % vaginal gel, Place 1 Applicatorful vaginally 2 (two) times daily., Disp:  70 g, Rfl: 0 .  UNABLE TO FIND, A-F Betafood, Disp: , Rfl:  .  cephALEXin (KEFLEX) 500 MG capsule, Take 1 capsule (500 mg total) by mouth 2 (two) times daily., Disp: 14 capsule, Rfl: 0 .  fluconazole (DIFLUCAN) 150 MG tablet, Take 1 tablet (150 mg total) by mouth once for 1 dose. 1 tab po x 1. May repeat in 72 hours if no improvement, Disp: 2 tablet, Rfl: 1  Allergies  Allergen Reactions  . Hydrochlorothiazide Shortness Of Breath, Swelling and Other (See Comments)    Patient states throat swelled up, felt SOB, chest pain, and tingling in arms and shoulders.  .  Amlodipine   . Hydrocodone   . Codeine Nausea And Vomiting  . Lisinopril Cough  . Loratadine Palpitations  . Metoprolol Other (See Comments)    bradycardia  . Olmesartan Rash     ROS  As noted in HPI.   Physical Exam  BP (!) 154/69 (BP Location: Left Arm)   Pulse (!) 53   Temp 98 F (36.7 C) (Oral)   Resp 18   Ht 5\' 8"  (1.727 m)   Wt 126.1 kg   SpO2 99%   BMI 42.27 kg/m    Wt Readings from Last 3 Encounters:  07/23/19 126.1 kg  06/16/19 127.5 kg  06/11/19 127.5 kg   Temp Readings from Last 3 Encounters:  07/23/19 98 F (36.7 C) (Oral)  06/16/19 (!) 95.6 F (35.3 C) (Oral)  06/11/19 (!) 97 F (36.1 C) (Temporal)   BP Readings from Last 3 Encounters:  07/23/19 (!) 154/69  06/16/19 136/82  06/11/19 138/90   Pulse Readings from Last 3 Encounters:  07/23/19 (!) 53  06/16/19 70  06/11/19 (!) 52    BP Readings from Last 3 Encounters:  07/23/19 (!) 154/69  06/16/19 136/82  06/11/19 138/90   Constitutional: Well developed, well nourished, no acute distress Eyes: PERRL, EOMI, conjunctiva normal bilaterally HENT: Normocephalic, atraumatic,mucus membranes moist Respiratory: Clear to auscultation bilaterally, no rales, no wheezing, no rhonchi Cardiovascular: Normal rate and rhythm, no murmurs, no gallops, no rubs GI: Soft, nondistended, normal bowel sounds, nontender, no rebound, no guarding positive lap chole scars Back: no CVAT skin: No rash, skin intact Musculoskeletal: No edema, no tenderness, no deformities Neurologic: Alert & oriented x 3, CN III-XII intact, no motor deficits, sensation grossly intact.  Tandem gait steady.  Coordination normal. Psychiatric: Speech and behavior appropriate   ED Course   Medications - No data to display  Orders Placed This Encounter  Procedures  . Urine culture    Standing Status:   Standing    Number of Occurrences:   1    Order Specific Question:   List patient's active antibiotics    Answer:   keflex  .  Urinalysis, Complete w Microscopic    Standing Status:   Standing    Number of Occurrences:   1  . Recheck vitals    Manual BP    Standing Status:   Standing    Number of Occurrences:   1  . ED EKG    Lightheadedness    Standing Status:   Standing    Number of Occurrences:   1    Order Specific Question:   Reason for Exam    Answer:   Other (See Comments)  . EKG 12-Lead    Standing Status:   Standing    Number of Occurrences:   1   Results for orders placed or performed during the  hospital encounter of 07/23/19 (from the past 24 hour(s))  Urinalysis, Complete w Microscopic     Status: Abnormal   Collection Time: 07/23/19  4:38 PM  Result Value Ref Range   Color, Urine YELLOW YELLOW   APPearance HAZY (A) CLEAR   Specific Gravity, Urine <1.005 (L) 1.005 - 1.030   pH 6.0 5.0 - 8.0   Glucose, UA NEGATIVE NEGATIVE mg/dL   Hgb urine dipstick NEGATIVE NEGATIVE   Bilirubin Urine NEGATIVE NEGATIVE   Ketones, ur NEGATIVE NEGATIVE mg/dL   Protein, ur NEGATIVE NEGATIVE mg/dL   Nitrite NEGATIVE NEGATIVE   Leukocytes,Ua MODERATE (A) NEGATIVE   Squamous Epithelial / LPF 0-5 0 - 5   WBC, UA 21-50 0 - 5 WBC/hpf   RBC / HPF 0-5 0 - 5 RBC/hpf   Bacteria, UA MANY (A) NONE SEEN   Budding Yeast PRESENT    No results found.  ED Clinical Impression  1. Urinary tract infection without hematuria, site unspecified   2. Intermittent lightheadedness   3. Essential hypertension      ED Assessment/Plan  Outside records reviewed as noted in HPI.  Patient has been bradycardic in the past.  Her blood pressure has been running very well controlled.  Initial blood pressure noted.  She denies any symptoms currently.  Manual blood pressure cuff not working.  Repeat blood pressure much improved.  She will start measuring her blood pressure at home and keep a log of this.  Advised her to measure once a day preferably the same time every day and bring a log of this with her to her primary care office.   Advised her to bring her blood pressure cuff to the primary care visit as well.  EKG: Sinus bradycardia, rate 45.  Normal axis, normal intervals.  No hypertrophy.  No ST or T wave changes.  No changes compared to EKG from April 2017.  Bradycardia present on previous EKG.  Labs reviewed.  Patient with a UTI.  Suspect this is the cause of her symptoms especially since she had identical symptoms with a previous UTI.  Will send home with Keflex.  She has no urinary symptoms do not think she needs Pyridium.  Sending urine off for culture. Patient also noted to have yeast in her urine.  Discussed not treating this because she has no symptoms, however she would like to be treated.  We discussed doing vaginal creams, but patient prefers an oral medication.  Will treat with Diflucan.  Push fluids.  Follow-up with PMD in several days.  To the ER if she gets worse, fevers, chest pain, shortness of breath, strokelike symptoms or for any concerns.  Discussed labs, MDM, treatment plan, and plan for follow-up with patient Discussed sn/sx that should prompt return to the ED. patient agrees with plan.   Meds ordered this encounter  Medications  . cephALEXin (KEFLEX) 500 MG capsule    Sig: Take 1 capsule (500 mg total) by mouth 2 (two) times daily.    Dispense:  14 capsule    Refill:  0  . fluconazole (DIFLUCAN) 150 MG tablet    Sig: Take 1 tablet (150 mg total) by mouth once for 1 dose. 1 tab po x 1. May repeat in 72 hours if no improvement    Dispense:  2 tablet    Refill:  1    *This clinic note was created using Lobbyist. Therefore, there may be occasional mistakes despite careful proofreading.  ?   Melynda Ripple,  MD 07/23/19 1818

## 2019-07-23 NOTE — ED Triage Notes (Addendum)
Pt presents with c/o some lightheadedness and dizziness mid-morning today. She states she was feeling well up until then. Pt reports she has not felt "well" the rest of the day today, says she feels "off". Pt reports last time she felt this way she had a UTI. Pt denies any current urinary s/s. Pt also had J&J vaccine earlier this month. Pt is asking to have vitals and labs done.

## 2019-07-23 NOTE — Discharge Instructions (Signed)
Finish the Keflex, even if you feel better.  Push plenty of extra fluids.  I have sent your urine off for culture to make sure that you are on the right antibiotic.  We will contact you if we need to change your antibiotics.  Decrease your salt intake.  Continue exercising.  Diet and exercise will lower your blood pressure significantly. It is important to keep your blood pressure under good control, as having a elevated blood pressure for prolonged periods of time significantly increases your risk of stroke, heart attacks, kidney damage, eye damage, and other problems. Measure your blood pressure once a day, preferably at the same time every day. Keep a log of this and bring it to your next doctor's appointment.  Bring your blood pressure cuff as well.   Return immediately to the ER if you start having chest pain, headache, problems seeing, problems talking, problems walking, if you feel like you're about to pass out, if you do pass out, if you have a seizure, or for any other concerns.  Go to www.goodrx.com to look up your medications. This will give you a list of where you can find your prescriptions at the most affordable prices. Or ask the pharmacist what the cash price is, or if they have any other discount programs available to help make your medication more affordable. This can be less expensive than what you would pay with insurance.

## 2019-07-24 ENCOUNTER — Telehealth: Payer: Self-pay

## 2019-07-24 ENCOUNTER — Ambulatory Visit: Payer: PRIVATE HEALTH INSURANCE | Admitting: Family Medicine

## 2019-07-24 LAB — URINE CULTURE

## 2019-07-24 NOTE — Telephone Encounter (Signed)
Called pt- left message. Hold b/p med through weekend. Check readings once a day and write them down. Call front office to schedule appt for Monday. Bring readings and b/p cuff with her on Monday

## 2019-07-27 ENCOUNTER — Other Ambulatory Visit: Payer: Self-pay

## 2019-07-27 ENCOUNTER — Encounter: Payer: Self-pay | Admitting: Internal Medicine

## 2019-07-27 ENCOUNTER — Ambulatory Visit (INDEPENDENT_AMBULATORY_CARE_PROVIDER_SITE_OTHER): Payer: PRIVATE HEALTH INSURANCE | Admitting: Internal Medicine

## 2019-07-27 VITALS — BP 134/80 | HR 50 | Temp 95.7°F | Ht 68.0 in | Wt 271.0 lb

## 2019-07-27 DIAGNOSIS — N816 Rectocele: Secondary | ICD-10-CM

## 2019-07-27 DIAGNOSIS — N3000 Acute cystitis without hematuria: Secondary | ICD-10-CM | POA: Diagnosis not present

## 2019-07-27 DIAGNOSIS — I10 Essential (primary) hypertension: Secondary | ICD-10-CM | POA: Diagnosis not present

## 2019-07-27 DIAGNOSIS — Z1231 Encounter for screening mammogram for malignant neoplasm of breast: Secondary | ICD-10-CM | POA: Diagnosis not present

## 2019-07-27 NOTE — Progress Notes (Signed)
Date:  07/27/2019   Name:  Courtney Robinson   DOB:  [birth year]   MRN:  [id number]   Chief Complaint: Hypertension (Seen UC - was having lightheadness and found out she had a UTI. Finished one yesterday. Still on one more. )  Hypertension This is a chronic problem. The problem is controlled. Pertinent negatives include no chest pain, headaches, palpitations or shortness of breath. Past treatments include beta blockers. The current treatment provides significant improvement. There are no compliance problems.    UTI - seen in UC with positive urine.  Cx came back as multiple species.  She was given Keflex and Diflucan.  She feels well.  While in the UC her BP was elevated.  She has not changed her supplements or consumed excessive caffeine.     Lab Results  Component Value Date   CREATININE 1.02 (H) 12/10/2018   BUN 15 12/10/2018   NA 139 12/10/2018   K 4.7 12/10/2018   CL 104 12/10/2018   CO2 24 12/10/2018   Lab Results  Component Value Date   CHOL 192 12/10/2018   HDL 60 12/10/2018   LDLCALC 108 (H) 12/10/2018   TRIG 137 12/10/2018   CHOLHDL 3.2 12/10/2018   Lab Results  Component Value Date   TSH 1.580 12/10/2018   Lab Results  Component Value Date   HGBA1C 5.9 (H) 12/10/2018   Lab Results  Component Value Date   WBC 6.1 12/10/2018   HGB 13.6 12/10/2018   HCT 43.2 12/10/2018   MCV 94 12/10/2018   PLT 230 12/10/2018   Lab Results  Component Value Date   ALT 9 12/10/2018   AST 14 12/10/2018   ALKPHOS 80 12/10/2018   BILITOT 0.2 12/10/2018     Review of Systems  Constitutional: Negative for chills, fatigue and fever.  Respiratory: Negative for chest tightness and shortness of breath.   Cardiovascular: Negative for chest pain, palpitations and leg swelling.  Genitourinary: Negative for dysuria, hematuria and pelvic pain.  Neurological: Negative for dizziness, light-headedness and headaches.    Patient Active Problem List   Diagnosis Date  Noted  . Arthralgia of left temporomandibular joint 10/15/2017  . Pre-diabetes 08/16/2017  . Environmental and seasonal allergies 07/20/2016  . 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 Left knee pain 08/12/2015  . History of palpitations 08/12/2015  . Essential (primary) hypertension 01/17/2015  . Arthralgia of hand 01/17/2015  . Adult BMI 40.0-44.9 kg/sq m ([place]) 12/30/2009    Allergies  Allergen Reactions  . Hydrochlorothiazide Shortness Of Breath, Swelling and Other (See Comments)    Patient states throat swelled up, felt SOB, chest pain, and tingling in arms and shoulders.  . Amlodipine   . Hydrocodone   . Codeine Nausea And Vomiting  . Lisinopril Cough  . Loratadine Palpitations  . Metoprolol Other (See Comments)    bradycardia  . Olmesartan Rash    Past Surgical History:  Procedure Laterality Date  . CHOLECYSTECTOMY  2013  . 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 Smoking status: Never Smoker  . Smokeless tobacco: Never Used  Substance Use Topics  . Alcohol use: No    Alcohol/week: 0.0 standard drinks  . Drug use: No     Medication list has been reviewed and updated.  Current Meds  Medication Sig  . Ascorbic Acid (VITAMIN C) 1000 MG tablet Take 1,000 mg by mouth daily.  Marland Kitchen  BYSTOLIC 5 MG tablet TAKE 1 TABLET BY MOUTH EVERY DAY  . cephALEXin (KEFLEX) 500 MG capsule Take 1 capsule (500 mg total) by mouth 2 (two) times daily.  . chlorhexidine (PERIDEX) 0.12 % solution Use as directed 15 mLs in the mouth or throat 2 (two) times daily.  . D-Mannose POWD Take by mouth.  Haydee Salter Leaf POWD by Does not apply route.  . lactobacillus acidophilus (BACID) TABS tablet Take 2 tablets by mouth 3 (three) times daily.  . Lido-Menthol-Methyl Sal-Camph (CBD KINGS EX) Apply topically.  . metroNIDAZOLE (METROGEL VAGINAL) 0.75 % vaginal gel Place 1 Applicatorful vaginally  2 (two) times daily.  Marland Kitchen UNABLE TO FIND A-F Betafood    PHQ 2/9 Scores 07/27/2019 06/16/2019 06/11/2019 12/10/2018  PHQ - 2 Score 0 0 0 0  PHQ- 9 Score 0 - - 0    BP Readings from Last 3 Encounters:  07/27/19 134/80  07/23/19 (!) 154/69  06/16/19 136/82    Physical Exam Vitals and nursing note reviewed.  Constitutional:      General: She is not in acute distress.    Appearance: Normal appearance. She is well-developed.  HENT:     Head: Normocephalic and atraumatic.  Cardiovascular:     Rate and Rhythm: Normal rate and regular rhythm.     Pulses: Normal pulses.  Pulmonary:     Effort: Pulmonary effort is normal. No respiratory distress.     Breath sounds: Normal breath sounds. No wheezing.  Musculoskeletal:        General: Normal range of motion.     Cervical back: Normal range of motion.     Right lower leg: No edema.     Left lower leg: No edema.  Skin:    General: Skin is warm and dry.     Capillary Refill: Capillary refill takes less than 2 seconds.     Findings: No rash.  Neurological:     General: No focal deficit present.     Mental Status: She is alert and oriented to person, place, and time.  Psychiatric:        Behavior: Behavior normal.        Thought Content: Thought content normal.     Wt Readings from Last 3 Encounters:  07/27/19 271 lb (122.9 kg)  07/23/19 278 lb (126.1 kg)  06/16/19 281 lb (127.5 kg)    BP 134/80 Comment: 147/80 on her automatic BP CUFF  Pulse (!) 50   Temp (!) 95.7 F (35.4 C) (Temporal)   Ht 5\' 8"  (1.727 m)   Wt 271 lb (122.9 kg)   SpO2 98%   BMI 41.21 kg/m   Assessment and Plan: 1. Essential (primary) hypertension Clinically stable exam with well controlled BP on bystolic. Recent elevation likely due to infection which is responding to antibiotics. Continue to monitor at home several times a week - call if persistently >140 for a dose change on bystolic Tolerating medications without side effects at this time. Pt to  continue current regimen and low sodium diet; benefits of regular exercise as able discussed.  2. Encounter for screening mammogram for breast cancer - MM 3D SCREEN BREAST BILATERAL; Future  3. Rectocele Seen by [facility]YN - pt relates a negative experience and will not be returning She had an area of alopecia which was photographed? Pap was normal. She can return here for evaluation if sx worsen  4. Acute cystitis without hematuria Complete course of antibiotics and call if sx recur Cx showed  mixed flora   Partially dictated using Editor, commissioning. Any errors are unintentional.  Halina Maidens, MD [facility]  07/27/2019

## 2019-09-08 ENCOUNTER — Other Ambulatory Visit: Payer: Self-pay

## 2019-09-08 ENCOUNTER — Telehealth: Payer: Self-pay | Admitting: Internal Medicine

## 2019-09-08 DIAGNOSIS — N816 Rectocele: Secondary | ICD-10-CM

## 2019-09-08 NOTE — Telephone Encounter (Signed)
Sent pt a my chart message asking why the referral is needed. We cannot send referrals without a diagnosis or reason.  CM

## 2019-09-08 NOTE — Telephone Encounter (Signed)
Copied from [place] [phone]. Topic: General - Other >> Sep 08, 2019  9:47 AM Hinda Lenis D wrote: Reason for CRM: PT need to be refer to a gastro Dr / please advise

## 2019-09-28 ENCOUNTER — Telehealth: Payer: Self-pay | Admitting: Internal Medicine

## 2019-09-28 NOTE — Telephone Encounter (Signed)
Called pt transferred her to schedule appt. Appt was made for next week.  KP

## 2019-09-28 NOTE — Telephone Encounter (Unsigned)
Copied from [place] [id number]. Topic: Appointment Scheduling - Scheduling Inquiry for Clinic >> Sep 28, 2019  4:26 PM Celene Kras wrote: Reason for CRM: Pt called is requesting to have her second shingrx shot. Pt is also requesting to have a nurse give her a call back regarding a medical question. Pt would not leave more info " because she didn't want to speak with an operator". Please advise.

## 2019-10-07 ENCOUNTER — Ambulatory Visit: Payer: PRIVATE HEALTH INSURANCE

## 2019-10-07 ENCOUNTER — Telehealth: Payer: Self-pay | Admitting: Internal Medicine

## 2019-10-07 ENCOUNTER — Encounter: Payer: Self-pay | Admitting: Internal Medicine

## 2019-10-07 ENCOUNTER — Other Ambulatory Visit: Payer: Self-pay

## 2019-10-07 ENCOUNTER — Ambulatory Visit (INDEPENDENT_AMBULATORY_CARE_PROVIDER_SITE_OTHER): Payer: PRIVATE HEALTH INSURANCE | Admitting: Internal Medicine

## 2019-10-07 VITALS — BP 124/78 | HR 57 | Ht 68.0 in | Wt 271.0 lb

## 2019-10-07 DIAGNOSIS — Z23 Encounter for immunization: Secondary | ICD-10-CM | POA: Diagnosis not present

## 2019-10-07 DIAGNOSIS — Z6841 Body Mass Index (BMI) 40.0 and over, adult: Secondary | ICD-10-CM | POA: Diagnosis not present

## 2019-10-07 DIAGNOSIS — R21 Rash and other nonspecific skin eruption: Secondary | ICD-10-CM | POA: Diagnosis not present

## 2019-10-07 MED ORDER — CLOTRIMAZOLE-BETAMETHASONE 1-0.05 % EX CREA: 1.0000 "application " | TOPICAL_CREAM | Freq: Two times a day (BID) | CUTANEOUS | 0 refills | Status: DC

## 2019-10-07 NOTE — Progress Notes (Signed)
Date:  10/07/2019   Name:  Courtney Robinson   DOB:  [birth year]   MRN:  [id number]   Chief Complaint: Bumps around Rectum (Had on and off since 81. Been tested for Herpes and its been Negative. Almost gone now-  still have rectal itching. )  Rash This is a recurrent problem. Episode onset: since age 64. Location: around the rectum and buttocks. Pertinent negatives include no fatigue, fever or shortness of breath.  Tested in the past for HSV, HIV - all negative.  Lesions appears during the summer, about once a year.  They are slightly painful and itchy, resolve over a week.  Treated at home with witchhazel compresses.  Lab Results  Component Value Date   CREATININE 1.02 (H) 12/10/2018   BUN 15 12/10/2018   NA 139 12/10/2018   K 4.7 12/10/2018   CL 104 12/10/2018   CO2 24 12/10/2018   Lab Results  Component Value Date   CHOL 192 12/10/2018   HDL 60 12/10/2018   LDLCALC 108 (H) 12/10/2018   TRIG 137 12/10/2018   CHOLHDL 3.2 12/10/2018   Lab Results  Component Value Date   TSH 1.580 12/10/2018   Lab Results  Component Value Date   HGBA1C 5.9 (H) 12/10/2018   Lab Results  Component Value Date   WBC 6.1 12/10/2018   HGB 13.6 12/10/2018   HCT 43.2 12/10/2018   MCV 94 12/10/2018   PLT 230 12/10/2018   Lab Results  Component Value Date   ALT 9 12/10/2018   AST 14 12/10/2018   ALKPHOS 80 12/10/2018   BILITOT 0.2 12/10/2018     Review of Systems  Constitutional: Negative for chills, fatigue and fever.  Respiratory: Negative for chest tightness and shortness of breath.   Cardiovascular: Negative for chest pain and palpitations.  Genitourinary: Negative for genital sores and vaginal discharge.  Skin: Positive for rash.  Neurological: Negative for dizziness and headaches.    Patient Active Problem List   Diagnosis Date Noted  . Rectocele 07/27/2019  . Arthralgia of left temporomandibular joint 10/15/2017  . Pre-diabetes 08/16/2017  . Environmental and  seasonal allergies 07/20/2016  . 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 Left knee pain 08/12/2015  . History of palpitations 08/12/2015  . Essential (primary) hypertension 01/17/2015  . Arthralgia of hand 01/17/2015  . Adult BMI 40.0-44.9 kg/sq m ([place]) 12/30/2009    Allergies  Allergen Reactions  . Hydrochlorothiazide Shortness Of Breath, Swelling and Other (See Comments)    Patient states throat swelled up, felt SOB, chest pain, and tingling in arms and shoulders.  . Amlodipine   . Hydrocodone   . Codeine Nausea And Vomiting  . Lisinopril Cough  . Loratadine Palpitations  . Metoprolol Other (See Comments)    bradycardia  . Olmesartan Rash    Past Surgical History:  Procedure Laterality Date  . CHOLECYSTECTOMY  2013  . 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 Smoking status: Never Smoker  . Smokeless tobacco: Never Used  Vaping Use  . Vaping Use: Never used  Substance Use Topics  . Alcohol use: No    Alcohol/week: 0.0 standard drinks  . Drug use: No     Medication list has been reviewed and updated.  Current Meds  Medication Sig  . Ascorbic Acid (VITAMIN C) 1000 MG tablet Take 1,000 mg by mouth daily.  Marland Kitchen BYSTOLIC 5 MG  tablet TAKE 1 TABLET BY MOUTH EVERY DAY  . chlorhexidine (PERIDEX) 0.12 % solution Use as directed 15 mLs in the mouth or throat 2 (two) times daily.  . D-Mannose POWD Take by mouth.  Haydee Salter Leaf POWD by Does not apply route.  . lactobacillus acidophilus (BACID) TABS tablet Take 2 tablets by mouth 3 (three) times daily.  . Lido-Menthol-Methyl Sal-Camph (CBD KINGS EX) Apply topically.  Marland Kitchen UNABLE TO FIND A-F Betafood    PHQ 2/9 Scores 10/07/2019 07/27/2019 06/16/2019 06/11/2019  PHQ - 2 Score 0 0 0 0  PHQ- 9 Score 0 0 - -    GAD 7 : Generalized Anxiety Score 10/07/2019 07/27/2019  Nervous, Anxious, on Edge 0 0  Control/stop worrying 0 0    Worry too much - different things 0 0  Trouble relaxing 0 0  Restless 0 0  Easily annoyed or irritable 0 0  Afraid - awful might happen 0 0  Total GAD 7 Score 0 0  Anxiety Difficulty Not difficult at all Not difficult at all    BP Readings from Last 3 Encounters:  10/07/19 124/78  07/27/19 134/80  07/23/19 (!) 154/69    Physical Exam Constitutional:      Appearance: Normal appearance.  Cardiovascular:     Rate and Rhythm: Normal rate and regular rhythm.  Genitourinary:   Musculoskeletal:     Cervical back: Normal range of motion.  Neurological:     Mental Status: She is alert.     Wt Readings from Last 3 Encounters:  10/07/19 271 lb (122.9 kg)  07/27/19 271 lb (122.9 kg)  07/23/19 278 lb (126.1 kg)    BP 124/78 (BP Location: Right Arm, Patient Position: Sitting, Cuff Size: Large)   Pulse (!) 57   Ht 5\' 8"  (1.727 m)   Wt 271 lb (122.9 kg)   SpO2 99%   BMI 41.21 kg/m   Assessment and Plan: 1. Rash and nonspecific skin eruption May be fungal - will try topical cream until healed and resume immediately with the next episode - clotrimazole-betamethasone (LOTRISONE) cream; Apply 1 application topically 2 (two) times daily. To perineal rash  Dispense: 30 g; Refill: 0  2. Need for shingles vaccine Second dose today - Varicella-zoster vaccine IM  3. Adult BMI 40.0-44.9 kg/sq m ([place]) Continue efforts at weight loss   Partially dictated using Editor, commissioning. Any errors are unintentional.  Halina Maidens, MD [facility]  10/07/2019

## 2019-10-07 NOTE — Telephone Encounter (Unsigned)
Copied from [place] [id number]. Topic: General - Other >> Oct 07, 2019  3:48 PM Erick Blinks wrote: Reason for CRM: Pt called to leave message for [place]. "Care Everywhere" pt was told to tell [place] to find picture on the last page. Dr. Delsa Sale, [phone].  If chassidy cannot find the picture, can call the nurses line Nurses Line: [phone]

## 2019-10-08 ENCOUNTER — Other Ambulatory Visit: Payer: Self-pay

## 2019-10-08 DIAGNOSIS — Z114 Encounter for screening for human immunodeficiency virus [HIV]: Secondary | ICD-10-CM

## 2019-10-08 DIAGNOSIS — R21 Rash and other nonspecific skin eruption: Secondary | ICD-10-CM

## 2019-10-08 NOTE — Telephone Encounter (Signed)
Noted. Will show Dr Army Melia and call patient after review.

## 2019-10-26 ENCOUNTER — Other Ambulatory Visit: Payer: Self-pay

## 2019-10-26 ENCOUNTER — Other Ambulatory Visit
Admission: RE | Admit: 2019-10-26 | Discharge: 2019-10-26 | Disposition: A | Discharge status: Home / Self Care | Payer: PRIVATE HEALTH INSURANCE | Attending: Internal Medicine | Admitting: Internal Medicine

## 2019-10-26 DIAGNOSIS — R21 Rash and other nonspecific skin eruption: Secondary | ICD-10-CM | POA: Diagnosis present

## 2019-10-26 DIAGNOSIS — Z114 Encounter for screening for human immunodeficiency virus [HIV]: Secondary | ICD-10-CM | POA: Insufficient documentation

## 2019-10-26 LAB — HIV ANTIBODY (ROUTINE TESTING W REFLEX): HIV Screen 4th Generation wRfx: NONREACTIVE

## 2019-10-27 LAB — HSV(HERPES SIMPLEX VRS) I + II AB-IGG
HSV 1 Glycoprotein G Ab, IgG: 33.8 index — ABNORMAL HIGH (ref 0.00–0.90)
HSV 2 Glycoprotein G Ab, IgG: 17.2 index — ABNORMAL HIGH (ref 0.00–0.90)

## 2019-12-05 ENCOUNTER — Other Ambulatory Visit: Payer: Self-pay | Admitting: Internal Medicine

## 2019-12-05 DIAGNOSIS — I10 Essential (primary) hypertension: Secondary | ICD-10-CM

## 2019-12-05 NOTE — Telephone Encounter (Signed)
Requested Prescriptions  Pending Prescriptions Disp Refills   BYSTOLIC 5 MG tablet [Pharmacy Med Name: BYSTOLIC 5 MG TABLET] 90 tablet 1    Sig: TAKE 1 TABLET BY MOUTH EVERY DAY     Cardiovascular:  Beta Blockers Passed - 12/05/2019 12:44 AM      Passed - Last BP in normal range    BP Readings from Last 1 Encounters:  10/07/19 124/78         Passed - Last Heart Rate in normal range    Pulse Readings from Last 1 Encounters:  10/07/19 (!) 57         Passed - Valid encounter within last 6 months    Recent Outpatient Visits          1 month ago Rash and nonspecific skin eruption   [facility] Glean Hess, MD   4 months ago Essential (primary) hypertension   [facility] Glean Hess, MD   5 months ago BV (bacterial vaginosis)   [facility] Glean Hess, MD   5 months ago Essential (primary) hypertension   [facility] Glean Hess, MD   12 months ago Annual physical exam   [facility] Glean Hess, MD      Future Appointments            In 1 week Army Melia Jesse Sans, MD [facility]

## 2019-12-10 ENCOUNTER — Ambulatory Visit
Admission: RE | Admit: 2019-12-10 | Discharge: 2019-12-10 | Disposition: A | Discharge status: Home / Self Care | Payer: PRIVATE HEALTH INSURANCE | Source: Ambulatory Visit | Attending: Internal Medicine | Admitting: Internal Medicine

## 2019-12-10 ENCOUNTER — Other Ambulatory Visit: Payer: Self-pay

## 2019-12-10 DIAGNOSIS — Z1231 Encounter for screening mammogram for malignant neoplasm of breast: Secondary | ICD-10-CM

## 2019-12-15 ENCOUNTER — Encounter: Payer: Self-pay | Admitting: Internal Medicine

## 2019-12-15 ENCOUNTER — Ambulatory Visit (INDEPENDENT_AMBULATORY_CARE_PROVIDER_SITE_OTHER): Payer: PRIVATE HEALTH INSURANCE | Admitting: Internal Medicine

## 2019-12-15 ENCOUNTER — Other Ambulatory Visit
Admission: RE | Admit: 2019-12-15 | Discharge: 2019-12-15 | Disposition: A | Discharge status: Home / Self Care | Payer: PRIVATE HEALTH INSURANCE | Attending: Internal Medicine | Admitting: Internal Medicine

## 2019-12-15 ENCOUNTER — Other Ambulatory Visit: Payer: Self-pay

## 2019-12-15 VITALS — BP 128/70 | HR 55 | Ht 68.0 in | Wt 275.0 lb

## 2019-12-15 DIAGNOSIS — I10 Essential (primary) hypertension: Secondary | ICD-10-CM | POA: Diagnosis not present

## 2019-12-15 DIAGNOSIS — Z0000 Encounter for general adult medical examination without abnormal findings: Secondary | ICD-10-CM | POA: Diagnosis present

## 2019-12-15 DIAGNOSIS — B009 Herpesviral infection, unspecified: Secondary | ICD-10-CM | POA: Diagnosis not present

## 2019-12-15 DIAGNOSIS — Z23 Encounter for immunization: Secondary | ICD-10-CM | POA: Diagnosis not present

## 2019-12-15 DIAGNOSIS — R7303 Prediabetes: Secondary | ICD-10-CM | POA: Diagnosis not present

## 2019-12-15 LAB — CBC WITH DIFFERENTIAL/PLATELET
Abs Immature Granulocytes: 0.01 10*3/uL (ref 0.00–0.07)
Basophils Absolute: 0.1 10*3/uL (ref 0.0–0.1)
Basophils Relative: 1 %
Eosinophils Absolute: 0.1 10*3/uL (ref 0.0–0.5)
Eosinophils Relative: 2 %
HCT: 40.2 % (ref 36.0–46.0)
Hemoglobin: 13.2 g/dL (ref 12.0–15.0)
Immature Granulocytes: 0 %
Lymphocytes Relative: 35 %
Lymphs Abs: 2.2 10*3/uL (ref 0.7–4.0)
MCH: 30.3 pg (ref 26.0–34.0)
MCHC: 32.8 g/dL (ref 30.0–36.0)
MCV: 92.4 fL (ref 80.0–100.0)
Monocytes Absolute: 0.5 10*3/uL (ref 0.1–1.0)
Monocytes Relative: 8 %
Neutro Abs: 3.4 10*3/uL (ref 1.7–7.7)
Neutrophils Relative %: 54 %
Platelets: 249 10*3/uL (ref 150–400)
RBC: 4.35 MIL/uL (ref 3.87–5.11)
RDW: 12.9 % (ref 11.5–15.5)
WBC: 6.2 10*3/uL (ref 4.0–10.5)
nRBC: 0 % (ref 0.0–0.2)

## 2019-12-15 LAB — COMPREHENSIVE METABOLIC PANEL
ALT: 16 U/L (ref 0–44)
AST: 19 U/L (ref 15–41)
Albumin: 4 g/dL (ref 3.5–5.0)
Alkaline Phosphatase: 63 U/L (ref 38–126)
Anion gap: 9 (ref 5–15)
BUN: 23 mg/dL (ref 8–23)
CO2: 26 mmol/L (ref 22–32)
Calcium: 8.9 mg/dL (ref 8.9–10.3)
Chloride: 104 mmol/L (ref 98–111)
Creatinine, Ser: 1.04 mg/dL — ABNORMAL HIGH (ref 0.44–1.00)
GFR calc Af Amer: >60 mL/min (ref >60)
GFR calc non Af Amer: 57 mL/min — ABNORMAL LOW (ref >60)
Glucose, Bld: 108 mg/dL — ABNORMAL HIGH (ref 70–99)
Potassium: 3.9 mmol/L (ref 3.5–5.1)
Sodium: 139 mmol/L (ref 135–145)
Total Bilirubin: 0.6 mg/dL (ref 0.3–1.2)
Total Protein: 8.3 g/dL — ABNORMAL HIGH (ref 6.5–8.1)

## 2019-12-15 LAB — POCT URINALYSIS DIPSTICK
Bilirubin, UA: NEGATIVE
Blood, UA: NEGATIVE
Glucose, UA: NEGATIVE
Ketones, UA: NEGATIVE
Nitrite, UA: NEGATIVE
Protein, UA: NEGATIVE
Spec Grav, UA: 1.015 (ref 1.010–1.025)
Urobilinogen, UA: 0.2 E.U./dL
pH, UA: 6 (ref 5.0–8.0)

## 2019-12-15 LAB — LIPID PANEL
Cholesterol: 218 mg/dL — ABNORMAL HIGH (ref 0–200)
HDL: 53 mg/dL (ref >40)
LDL Cholesterol: 133 mg/dL — ABNORMAL HIGH (ref 0–99)
Total CHOL/HDL Ratio: 4.1 RATIO
Triglycerides: 158 mg/dL — ABNORMAL HIGH (ref <150)
VLDL: 32 mg/dL (ref 0–40)

## 2019-12-15 LAB — HEMOGLOBIN A1C
Hgb A1c MFr Bld: 5.9 % — ABNORMAL HIGH (ref 4.8–5.6)
Mean Plasma Glucose: 122.63 mg/dL

## 2019-12-15 LAB — TSH: TSH: 1.889 u[IU]/mL (ref 0.350–4.500)

## 2019-12-15 MED ORDER — VALACYCLOVIR HCL 1 G PO TABS: 1000.0000 mg | ORAL_TABLET | Freq: Two times a day (BID) | ORAL | 0 refills | Status: AC

## 2019-12-15 NOTE — Progress Notes (Signed)
Date:  12/15/2019   Name:  Courtney Robinson   DOB:  April 01, 1956   MRN:  [id number]   Chief Complaint: Annual Exam (Breast Exam. No pap. Flu Shot.)  Courtney Robinson is a 64 y.o. female who presents today for her Complete Annual Exam. She feels well. She reports exercising - yoga. She reports she is sleeping fairly well. Breast complaints - none.  No recurrent buttock lesions - tested positive for HSV.  Mammogram: 12/2019 DEXA: none Pap smear: 07/2019 negative Colonoscopy: 05/2010 due 2022  Immunization History  Administered Date(s) Administered  . H1N1 06/30/2008  . Influenza Whole 12/21/2009  . Influenza,inj,Quad PF,6+ Mos 01/19/2015, 02/06/2017, 01/30/2018, 12/10/2018  . Janssen (J&J) SARS-COV-2 Vaccination 06/26/2019  . Td 04/09/2007  . Tdap 12/10/2018  . Zoster Recombinat (Shingrix) 04/08/2019, 10/07/2019    Hypertension This is a chronic problem. The problem is controlled. Pertinent negatives include no chest pain, headaches, palpitations or shortness of breath. Past treatments include beta blockers. The current treatment provides significant improvement.    Lab Results  Component Value Date   CREATININE 1.02 (H) 12/10/2018   BUN 15 12/10/2018   NA 139 12/10/2018   K 4.7 12/10/2018   CL 104 12/10/2018   CO2 24 12/10/2018   Lab Results  Component Value Date   CHOL 192 12/10/2018   HDL 60 12/10/2018   LDLCALC 108 (H) 12/10/2018   TRIG 137 12/10/2018   CHOLHDL 3.2 12/10/2018   Lab Results  Component Value Date   TSH 1.580 12/10/2018   Lab Results  Component Value Date   HGBA1C 5.9 (H) 12/10/2018   Lab Results  Component Value Date   WBC 6.1 12/10/2018   HGB 13.6 12/10/2018   HCT 43.2 12/10/2018   MCV 94 12/10/2018   PLT 230 12/10/2018   Lab Results  Component Value Date   ALT 9 12/10/2018   AST 14 12/10/2018   ALKPHOS 80 12/10/2018   BILITOT 0.2 12/10/2018     Review of Systems  Constitutional: Negative for chills, fatigue and  fever.  HENT: Negative for congestion, hearing loss, tinnitus, trouble swallowing and voice change.   Eyes: Negative for visual disturbance.  Respiratory: Negative for cough, chest tightness, shortness of breath and wheezing.   Cardiovascular: Negative for chest pain, palpitations and leg swelling.  Gastrointestinal: Negative for abdominal pain, constipation, diarrhea and vomiting.  Endocrine: Negative for polydipsia and polyuria.  Genitourinary: Negative for dysuria, frequency, genital sores, vaginal bleeding and vaginal discharge.  Musculoskeletal: Negative for arthralgias, gait problem and joint swelling.  Skin: Negative for color change and rash.  Neurological: Negative for dizziness, tremors, light-headedness and headaches.  Hematological: Negative for adenopathy. Does not bruise/bleed easily.  Psychiatric/Behavioral: Negative for dysphoric mood and sleep disturbance. The patient is not nervous/anxious.     Patient Active Problem List   Diagnosis Date Noted  . Rectocele 07/27/2019  . Arthralgia of left temporomandibular joint 10/15/2017  . Pre-diabetes 08/16/2017  . Environmental and seasonal allergies 07/20/2016  . 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 Left knee pain 08/12/2015  . History of palpitations 08/12/2015  . Essential (primary) hypertension 01/17/2015  . Arthralgia of hand 01/17/2015  . Adult BMI 40.0-44.9 kg/sq m ([place]) 12/30/2009    Allergies  Allergen Reactions  . Hydrochlorothiazide Shortness Of Breath, Swelling and Other (See Comments)    Patient states throat swelled up, felt SOB, chest pain, and tingling in arms and shoulders.  . Amlodipine   . Hydrocodone   . Codeine Nausea  And Vomiting  . Lisinopril Cough  . Loratadine Palpitations  . Metoprolol Other (See Comments)    bradycardia  . Olmesartan Rash    Past Surgical History:  Procedure Laterality Date  . CHOLECYSTECTOMY  2013  . ENDOMETRIAL BIOPSY  11/16/09   Disordered Proliferative Endometrium, negative  for hyperplasia, atypia or malignancy    Social History   Tobacco Use  . Smoking status: Never Smoker  . Smokeless tobacco: Never Used  Vaping Use  . Vaping Use: Never used  Substance Use Topics  . Alcohol use: No    Alcohol/week: 0.0 standard drinks  . Drug use: No     Medication list has been reviewed and updated.  Current Meds  Medication Sig  . Ascorbic Acid (VITAMIN C) 1000 MG tablet Take 1,000 mg by mouth daily.  Marland Kitchen BYSTOLIC 5 MG tablet TAKE 1 TABLET BY MOUTH EVERY DAY  . chlorhexidine (PERIDEX) 0.12 % solution Use as directed 15 mLs in the mouth or throat 2 (two) times daily.  . clotrimazole-betamethasone (LOTRISONE) cream Apply 1 application topically 2 (two) times daily. To perineal rash  . D-Mannose POWD Take by mouth.  Haydee Salter Leaf POWD by Does not apply route.  . lactobacillus acidophilus (BACID) TABS tablet Take 2 tablets by mouth 3 (three) times daily.  . Lido-Menthol-Methyl Sal-Camph (CBD KINGS EX) Apply topically.  . Magnesium 300 MG CAPS Take 2 capsules by mouth daily.  . TRACE MINERALS [id number] IV Inject into the vein.  Marland Kitchen UNABLE TO FIND A-F Betafood    PHQ 2/9 Scores 10/07/2019 07/27/2019 06/16/2019 06/11/2019  PHQ - 2 Score 0 0 0 0  PHQ- 9 Score 0 0 - -    GAD 7 : Generalized Anxiety Score 10/07/2019 07/27/2019  Nervous, Anxious, on Edge 0 0  Control/stop worrying 0 0  Worry too much - different things 0 0  Trouble relaxing 0 0  Restless 0 0  Easily annoyed or irritable 0 0  Afraid - awful might happen 0 0  Total GAD 7 Score 0 0  Anxiety Difficulty Not difficult at all Not difficult at all    BP Readings from Last 3 Encounters:  12/15/19 128/70  10/07/19 124/78  07/27/19 134/80    Physical Exam Vitals and nursing note reviewed.  Constitutional:      General: She is not in acute distress.    Appearance: She is well-developed.  HENT:     Head: Normocephalic and atraumatic.     Right Ear: Tympanic membrane and ear canal normal.      Left Ear: Tympanic membrane and ear canal normal.     Nose:     Right Sinus: No maxillary sinus tenderness.     Left Sinus: No maxillary sinus tenderness.  Eyes:     General: No scleral icterus.       Right eye: No discharge.        Left eye: No discharge.     Conjunctiva/sclera: Conjunctivae normal.  Neck:     Thyroid: No thyromegaly.     Vascular: No carotid bruit.  Cardiovascular:     Rate and Rhythm: Normal rate and regular rhythm.     Pulses: Normal pulses.     Heart sounds: Normal heart sounds.  Pulmonary:     Effort: Pulmonary effort is normal. No respiratory distress.     Breath sounds: No wheezing.  Chest:     Breasts:        Right: No mass, nipple discharge, skin change  or tenderness.        Left: No mass, nipple discharge, skin change or tenderness.  Abdominal:     General: Bowel sounds are normal.     Palpations: Abdomen is soft.     Tenderness: There is no abdominal tenderness.  Musculoskeletal:        General: Normal range of motion.     Cervical back: Normal range of motion. No erythema.     Right lower leg: No edema.     Left lower leg: No edema.  Lymphadenopathy:     Cervical: No cervical adenopathy.  Skin:    General: Skin is warm and dry.     Capillary Refill: Capillary refill takes less than 2 seconds.     Findings: No rash.  Neurological:     General: No focal deficit present.     Mental Status: She is alert and oriented to person, place, and time.     Cranial Nerves: No cranial nerve deficit.     Sensory: No sensory deficit.     Deep Tendon Reflexes: Reflexes are normal and symmetric.  Psychiatric:        Attention and Perception: Attention normal.        Mood and Affect: Mood normal.        Behavior: Behavior normal.     Wt Readings from Last 3 Encounters:  12/15/19 275 lb (124.7 kg)  10/07/19 271 lb (122.9 kg)  07/27/19 271 lb (122.9 kg)    BP 128/70   Pulse (!) 55   Ht 5\' 8"  (1.727 m)   Wt 275 lb (124.7 kg)   SpO2 98%   BMI  41.81 kg/m   Assessment and Plan: 1. Annual physical exam Normal exam except for weight Continue diet [facility]) and exercise - Lipid panel - POCT urinalysis dipstick - asx WBC/bacteria - continue fluids and call if UTI sx occur  2. Essential (primary) hypertension Clinically stable exam with well controlled BP on bystolic. Tolerating medications without side effects at this time. Pt to continue current regimen and low sodium diet; benefits of regular exercise as able discussed. - CBC with Differential/Platelet - Comprehensive metabolic panel - TSH  3. Pre-diabetes - Hemoglobin A1c  4. 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 2 tabs every 12 hours for 2 doses as needed  Dispense: 20 tablet; Refill: 0   Partially dictated using Editor, commissioning. Any errors are unintentional.  Halina Maidens, MD [facility]  12/15/2019

## 2019-12-17 ENCOUNTER — Telehealth: Payer: Self-pay | Admitting: *Deleted

## 2019-12-17 NOTE — Telephone Encounter (Signed)
Noted, thank you!  CM

## 2019-12-17 NOTE — Telephone Encounter (Signed)
Patient called about her recent lab and PCP recommendation- patient does not want to start statin at this time. She is going back on her diet with Merryl Hacker and she is going to start exercising. She has made appointment in [place] to come to office for follow up to her life changes.

## 2020-03-21 ENCOUNTER — Ambulatory Visit: Payer: Self-pay | Admitting: Internal Medicine

## 2020-04-11 ENCOUNTER — Ambulatory Visit: Payer: Self-pay | Admitting: Internal Medicine

## 2020-04-25 ENCOUNTER — Other Ambulatory Visit: Payer: Self-pay | Admitting: Obstetrics and Gynecology

## 2020-04-25 DIAGNOSIS — N816 Rectocele: Secondary | ICD-10-CM | POA: Diagnosis not present

## 2020-04-25 DIAGNOSIS — L80 Vitiligo: Secondary | ICD-10-CM | POA: Diagnosis not present

## 2020-04-25 DIAGNOSIS — Z13820 Encounter for screening for osteoporosis: Secondary | ICD-10-CM | POA: Diagnosis not present

## 2020-04-25 DIAGNOSIS — N898 Other specified noninflammatory disorders of vagina: Secondary | ICD-10-CM | POA: Diagnosis not present

## 2020-04-26 ENCOUNTER — Ambulatory Visit
Admission: RE | Admit: 2020-04-26 | Discharge: 2020-04-26 | Disposition: A | Discharge status: Home / Self Care | Payer: BC Managed Care – PPO | Source: Ambulatory Visit | Attending: Obstetrics and Gynecology | Admitting: Obstetrics and Gynecology

## 2020-04-26 ENCOUNTER — Other Ambulatory Visit: Payer: Self-pay

## 2020-04-26 DIAGNOSIS — Z780 Asymptomatic menopausal state: Secondary | ICD-10-CM | POA: Diagnosis not present

## 2020-04-26 DIAGNOSIS — Z13820 Encounter for screening for osteoporosis: Secondary | ICD-10-CM

## 2020-04-26 DIAGNOSIS — R634 Abnormal weight loss: Secondary | ICD-10-CM | POA: Diagnosis not present

## 2020-04-26 DIAGNOSIS — R29890 Loss of height: Secondary | ICD-10-CM | POA: Diagnosis not present

## 2020-04-26 DIAGNOSIS — M85851 Other specified disorders of bone density and structure, right thigh: Secondary | ICD-10-CM | POA: Diagnosis not present

## 2020-05-11 DIAGNOSIS — E7800 Pure hypercholesterolemia, unspecified: Secondary | ICD-10-CM | POA: Diagnosis not present

## 2020-05-11 DIAGNOSIS — Z1211 Encounter for screening for malignant neoplasm of colon: Secondary | ICD-10-CM | POA: Diagnosis not present

## 2020-05-11 DIAGNOSIS — N898 Other specified noninflammatory disorders of vagina: Secondary | ICD-10-CM | POA: Diagnosis not present

## 2020-05-11 DIAGNOSIS — N816 Rectocele: Secondary | ICD-10-CM | POA: Diagnosis not present

## 2020-05-11 DIAGNOSIS — Z01419 Encounter for gynecological examination (general) (routine) without abnormal findings: Secondary | ICD-10-CM | POA: Diagnosis not present

## 2020-05-31 ENCOUNTER — Other Ambulatory Visit: Payer: Self-pay | Admitting: Internal Medicine

## 2020-05-31 DIAGNOSIS — I10 Essential (primary) hypertension: Secondary | ICD-10-CM

## 2020-06-03 ENCOUNTER — Ambulatory Visit: Payer: Self-pay | Admitting: Internal Medicine

## 2020-06-06 ENCOUNTER — Telehealth: Payer: Self-pay | Admitting: Internal Medicine

## 2020-06-06 ENCOUNTER — Telehealth: Payer: Self-pay

## 2020-06-06 ENCOUNTER — Encounter: Payer: Self-pay | Admitting: Internal Medicine

## 2020-06-06 NOTE — Telephone Encounter (Signed)
Caller stated the prior auth has been authorized for BYSTOLIC 5 MG tablet

## 2020-06-06 NOTE — Telephone Encounter (Signed)
Called patients new insurance [employer].   Completed PA over the phone for Bystolic 5 mg tablets.  PA was APPROVED from 06/06/20 - 06/06/2021.  Send patient a my chart message to inform. Asked her to bring in a copy of her insurance card at next visit so we have this on file.  Clista Bernhardt, CMA (AAMA)

## 2020-06-06 NOTE — Telephone Encounter (Signed)
Completed Prior Auth. See PA note from today - 06/06/20.

## 2020-06-06 NOTE — Telephone Encounter (Signed)
See note.  I thought bystolic was going generic this year.

## 2020-06-06 NOTE — Telephone Encounter (Signed)
Copied from [place] [id number]. Topic: General - Other >> Jun 06, 2020  8:06 AM Alanda Slim E wrote: Reason for CRM: Pt needs a formulation accessibility form completed for her medication /BYSTOLIC 5 MG tablet by [employer]/ please advise  Her insurance is [place] / provider services number is [phone]

## 2020-06-07 ENCOUNTER — Ambulatory Visit: Payer: PRIVATE HEALTH INSURANCE | Admitting: Internal Medicine

## 2020-07-01 ENCOUNTER — Other Ambulatory Visit: Payer: Self-pay | Admitting: Internal Medicine

## 2020-07-01 DIAGNOSIS — H25013 Cortical age-related cataract, bilateral: Secondary | ICD-10-CM | POA: Diagnosis not present

## 2020-07-01 DIAGNOSIS — I10 Essential (primary) hypertension: Secondary | ICD-10-CM

## 2020-07-01 DIAGNOSIS — H43811 Vitreous degeneration, right eye: Secondary | ICD-10-CM | POA: Diagnosis not present

## 2020-07-01 NOTE — Telephone Encounter (Signed)
   Notes to clinic: Patient not due for appointment until 12/20/2020 Review for refill until then   Requested Prescriptions  Pending Prescriptions Disp Refills   BYSTOLIC 5 MG tablet [Pharmacy Med Name: BYSTOLIC 5 MG TABLET] 30 tablet 0    Sig: TAKE 1 TABLET BY MOUTH EVERY DAY      Cardiovascular:  Beta Blockers Failed - 07/01/2020 12:32 PM      Failed - Valid encounter within last 6 months    Recent Outpatient Visits           6 months ago Annual physical exam   [facility] Glean Hess, MD   8 months ago Rash and nonspecific skin eruption   [facility] Glean Hess, MD   11 months ago Essential (primary) hypertension   [facility] Glean Hess, MD   1 year ago BV (bacterial vaginosis)   [facility] Glean Hess, MD   1 year ago Essential (primary) hypertension   [facility] Glean Hess, MD       Future Appointments             In 5 months Glean Hess, MD [facility] [place] BP in normal range    BP Readings from Last 1 Encounters:  12/15/19 128/70          Passed - Last Heart Rate in normal range    Pulse Readings from Last 1 Encounters:  12/15/19 (!) 55

## 2020-07-04 NOTE — Telephone Encounter (Signed)
Left message to call back to set up blood pressure appointment-ah

## 2020-07-06 ENCOUNTER — Ambulatory Visit: Payer: Self-pay | Admitting: Internal Medicine

## 2020-07-11 ENCOUNTER — Ambulatory Visit: Payer: Self-pay | Admitting: Internal Medicine

## 2020-07-28 ENCOUNTER — Ambulatory Visit: Payer: Self-pay | Admitting: Internal Medicine

## 2020-07-29 DIAGNOSIS — H1045 Other chronic allergic conjunctivitis: Secondary | ICD-10-CM | POA: Diagnosis not present

## 2020-07-29 DIAGNOSIS — H43811 Vitreous degeneration, right eye: Secondary | ICD-10-CM | POA: Diagnosis not present

## 2020-07-29 DIAGNOSIS — H25013 Cortical age-related cataract, bilateral: Secondary | ICD-10-CM | POA: Diagnosis not present

## 2020-08-01 ENCOUNTER — Other Ambulatory Visit: Payer: Self-pay | Admitting: Internal Medicine

## 2020-08-01 DIAGNOSIS — I10 Essential (primary) hypertension: Secondary | ICD-10-CM

## 2020-08-14 NOTE — Progress Notes (Signed)
Date:  08/15/2020   Name:  Courtney Robinson   DOB:  [birth year]   MRN:  [id number]   Chief Complaint: Hypertension (Only takes bystolic no sub. For generic) and Hyperlipidemia  Hypertension This is a chronic problem. The problem is controlled. Pertinent negatives include no chest pain, headaches, palpitations or shortness of breath. Past treatments include beta blockers. The current treatment provides significant improvement.  Diabetes She presents for her follow-up diabetic visit. Diabetes type: prediabetes. Her disease course has been stable. Pertinent negatives for hypoglycemia include no dizziness or headaches. Pertinent negatives for diabetes include no chest pain, no fatigue and no weakness. An ACE inhibitor/angiotensin II receptor blocker is not being taken.  Hyperlipidemia This is a chronic problem. The problem is uncontrolled. Recent lipid tests were reviewed and are high. Exacerbating diseases include obesity. Pertinent negatives include no chest pain or shortness of breath. Current antihyperlipidemic treatment includes diet change (declined statins after last visit).    Lab Results  Component Value Date   CREATININE 1.04 (H) 12/15/2019   BUN 23 12/15/2019   NA 139 12/15/2019   K 3.9 12/15/2019   CL 104 12/15/2019   CO2 26 12/15/2019   Lab Results  Component Value Date   CHOL 218 (H) 12/15/2019   HDL 53 12/15/2019   LDLCALC 133 (H) 12/15/2019   TRIG 158 (H) 12/15/2019   CHOLHDL 4.1 12/15/2019   Lab Results  Component Value Date   TSH 1.889 12/15/2019   Lab Results  Component Value Date   HGBA1C 5.9 (H) 12/15/2019   Lab Results  Component Value Date   WBC 6.2 12/15/2019   HGB 13.2 12/15/2019   HCT 40.2 12/15/2019   MCV 92.4 12/15/2019   PLT 249 12/15/2019   Lab Results  Component Value Date   ALT 16 12/15/2019   AST 19 12/15/2019   ALKPHOS 63 12/15/2019   BILITOT 0.6 12/15/2019     Review of Systems  Constitutional: Negative for fatigue and  unexpected weight change.  HENT: Negative for nosebleeds.   Eyes: Negative for visual disturbance.  Respiratory: Negative for cough, chest tightness, shortness of breath and wheezing.   Cardiovascular: Negative for chest pain, palpitations and leg swelling.  Gastrointestinal: Negative for abdominal pain, constipation and diarrhea.  Neurological: Negative for dizziness, weakness, light-headedness and headaches.    Patient Active Problem List   Diagnosis Date Noted  . HSV (herpes simplex virus) infection 12/15/2019  . Rectocele 07/27/2019  . Arthralgia of left temporomandibular joint 10/15/2017  . Pre-diabetes 08/16/2017  . Environmental and seasonal allergies 07/20/2016  . 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 Left knee pain 08/12/2015  . History of palpitations 08/12/2015  . Essential (primary) hypertension 01/17/2015  . Arthralgia of hand 01/17/2015  . Adult BMI 40.0-44.9 kg/sq m ([place]) 12/30/2009  . Mixed hyperlipidemia 03/25/2009    Allergies  Allergen Reactions  . Hydrochlorothiazide Shortness Of Breath, Swelling and Other (See Comments)    Patient states throat swelled up, felt SOB, chest pain, and tingling in arms and shoulders.  . Amlodipine   . Hydrocodone   . Codeine Nausea And Vomiting  . Lisinopril Cough  . Loratadine Palpitations  . Metoprolol Other (See Comments)    bradycardia  . Olmesartan Rash    Past Surgical History:  Procedure Laterality Date  . CHOLECYSTECTOMY  2013  . 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 Smoking  status: Never Smoker  . Smokeless tobacco: Never Used  Vaping Use  . Vaping Use: Never used  Substance Use Topics  . Alcohol use: No    Alcohol/week: 0.0 standard drinks  . Drug use: No     Medication list has been reviewed and updated.  Current Meds  Medication Sig  . Ascorbic Acid (VITAMIN C) 1000 MG tablet Take 1,000 mg  by mouth daily.  Marland Kitchen BYSTOLIC 5 MG tablet TAKE 1 TABLET BY MOUTH EVERY DAY  . clotrimazole-betamethasone (LOTRISONE) cream Apply 1 application topically 2 (two) times daily. To perineal rash  . D-Mannose POWD Take by mouth.  Haydee Salter Leaf POWD by Does not apply route.  . lactobacillus acidophilus (BACID) TABS tablet Take 2 tablets by mouth 3 (three) times daily.  . Lido-Menthol-Methyl Sal-Camph (CBD KINGS EX) Apply topically.  . Magnesium 300 MG CAPS Take 2 capsules by mouth daily.  . TRACE MINERALS CR-CU-MN-ZN IV Inject into the vein.  Marland Kitchen UNABLE TO FIND A-F Betafood  . valACYclovir (VALTREX) 1000 MG tablet Take 1 tablet (1,000 mg total) by mouth 2 (two) times daily. 2 tabs every 12 hours for 2 doses as needed  . [DISCONTINUED] chlorhexidine (PERIDEX) 0.12 % solution Use as directed 15 mLs in the mouth or throat 2 (two) times daily.    PHQ 2/9 Scores 08/15/2020 10/07/2019 07/27/2019 06/16/2019  PHQ - 2 Score 0 0 0 0  PHQ- 9 Score 0 0 0 -    GAD 7 : Generalized Anxiety Score 08/15/2020 10/07/2019 07/27/2019  Nervous, Anxious, on Edge 0 0 0  Control/stop worrying 0 0 0  Worry too much - different things 0 0 0  Trouble relaxing 0 0 0  Restless 0 0 0  Easily annoyed or irritable 0 0 0  Afraid - awful might happen 0 0 0  Total GAD 7 Score 0 0 0  Anxiety Difficulty - Not difficult at all Not difficult at all    BP Readings from Last 3 Encounters:  08/15/20 122/82  12/15/19 128/70  10/07/19 124/78    Physical Exam Vitals and nursing note reviewed.  Constitutional:      General: She is not in acute distress.    Appearance: She is well-developed.  HENT:     Head: Normocephalic and atraumatic.  Cardiovascular:     Rate and Rhythm: Normal rate and regular rhythm.     Pulses: Normal pulses.     Heart sounds: No murmur heard.   Pulmonary:     Effort: Pulmonary effort is normal. No respiratory distress.  Musculoskeletal:     Cervical back: Normal range of motion.     Right  lower leg: No edema.     Left lower leg: No edema.  Lymphadenopathy:     Cervical: No cervical adenopathy.  Skin:    General: Skin is warm and dry.     Capillary Refill: Capillary refill takes less than 2 seconds.     Findings: No rash.  Neurological:     Mental Status: She is alert and oriented to person, place, and time.  Psychiatric:        Mood and Affect: Mood normal.        Behavior: Behavior normal.     Wt Readings from Last 3 Encounters:  08/15/20 278 lb (126.1 kg)  12/15/19 275 lb (124.7 kg)  10/07/19 271 lb (122.9 kg)    BP 122/82   Pulse 65   Temp 98 F (36.7 C) (Oral)  Ht 5\' 8"  (1.727 m)   Wt 278 lb (126.1 kg)   SpO2 97%   BMI 42.27 kg/m   Assessment and Plan: 1. Essential (primary) hypertension Clinically stable exam with well controlled BP on Bystolic. Tolerating medications without side effects at this time. Pt to continue current regimen and low sodium diet; benefits of regular exercise as able discussed. - Basic metabolic panel - BYSTOLIC 5 MG tablet; Take 1 tablet (5 mg total) by mouth daily.  Dispense: 90 tablet; Refill: 1  2. Pre-diabetes Continue dietary efforts and resume exercise - Hemoglobin A1c  3. Mixed hyperlipidemia Does not want statins - will continue diet changes - Lipid panel  4. Need for vaccination for pneumococcus Will wait until next visit  5. Adult BMI 40.0-44.9 kg/sq m [facility]) Diet/exercise discussed   Partially dictated using Editor, commissioning. Any errors are unintentional.  Halina Maidens, MD [facility]  08/15/2020

## 2020-08-15 ENCOUNTER — Ambulatory Visit: Payer: BC Managed Care – PPO | Admitting: Internal Medicine

## 2020-08-15 ENCOUNTER — Other Ambulatory Visit: Payer: Self-pay

## 2020-08-15 ENCOUNTER — Encounter: Payer: Self-pay | Admitting: Internal Medicine

## 2020-08-15 ENCOUNTER — Other Ambulatory Visit: Admission: RE | Admit: 2020-08-15 | Payer: BC Managed Care – PPO | Source: Home / Self Care

## 2020-08-15 VITALS — BP 122/82 | HR 65 | Temp 98.0°F | Ht 68.0 in | Wt 278.0 lb

## 2020-08-15 DIAGNOSIS — Z23 Encounter for immunization: Secondary | ICD-10-CM | POA: Diagnosis not present

## 2020-08-15 DIAGNOSIS — Z6841 Body Mass Index (BMI) 40.0 and over, adult: Secondary | ICD-10-CM

## 2020-08-15 DIAGNOSIS — E782 Mixed hyperlipidemia: Secondary | ICD-10-CM | POA: Diagnosis not present

## 2020-08-15 DIAGNOSIS — R7303 Prediabetes: Secondary | ICD-10-CM | POA: Diagnosis not present

## 2020-08-15 DIAGNOSIS — I10 Essential (primary) hypertension: Secondary | ICD-10-CM

## 2020-08-15 MED ORDER — BYSTOLIC 5 MG PO TABS: 5.0000 mg | ORAL_TABLET | Freq: Every day | ORAL | 1 refills | Status: DC

## 2020-08-16 ENCOUNTER — Telehealth: Payer: Self-pay | Admitting: Internal Medicine

## 2020-08-16 LAB — LIPID PANEL
Chol/HDL Ratio: 3.4 ratio (ref 0.0–4.4)
Cholesterol, Total: 202 mg/dL — ABNORMAL HIGH (ref 100–199)
HDL: 60 mg/dL (ref >39)
LDL Chol Calc (NIH): 117 mg/dL — ABNORMAL HIGH (ref 0–99)
Triglycerides: 143 mg/dL (ref 0–149)
VLDL Cholesterol Cal: 25 mg/dL (ref 5–40)

## 2020-08-16 LAB — BASIC METABOLIC PANEL
BUN/Creatinine Ratio: 18 (ref 12–28)
BUN: 16 mg/dL (ref 8–27)
CO2: 19 mmol/L — ABNORMAL LOW (ref 20–29)
Calcium: 9.5 mg/dL (ref 8.7–10.3)
Chloride: 104 mmol/L (ref 96–106)
Creatinine, Ser: 0.91 mg/dL (ref 0.57–1.00)
Glucose: 77 mg/dL (ref 65–99)
Potassium: 4.8 mmol/L (ref 3.5–5.2)
Sodium: 140 mmol/L (ref 134–144)
eGFR: 70 mL/min/{1.73_m2} (ref >59)

## 2020-08-16 LAB — HEMOGLOBIN A1C
Est. average glucose Bld gHb Est-mCnc: 123 mg/dL
Hgb A1c MFr Bld: 5.9 % — ABNORMAL HIGH (ref 4.8–5.6)

## 2020-08-16 NOTE — Telephone Encounter (Signed)
See lab note- patient notified: Cholesterol is about 15 points lower. Pre-diabetes is unchanged. Kidney function is normal.

## 2020-08-16 NOTE — Telephone Encounter (Signed)
Copied from [place] [phone]. Topic: Quick Communication - Lab Results (Clinic Use ONLY) >> Aug 16, 2020  3:47 PM Fredderick Severance wrote: Called patient to inform them of lab results. When patient returns call, triage nurse may disclose results. Patient would like a call back at Ph# [phone] (

## 2020-08-22 DIAGNOSIS — M2560 Stiffness of unspecified joint, not elsewhere classified: Secondary | ICD-10-CM | POA: Diagnosis not present

## 2020-08-22 DIAGNOSIS — M6240 Contracture of muscle, unspecified site: Secondary | ICD-10-CM | POA: Diagnosis not present

## 2020-08-22 DIAGNOSIS — M9902 Segmental and somatic dysfunction of thoracic region: Secondary | ICD-10-CM | POA: Diagnosis not present

## 2020-08-22 DIAGNOSIS — M9903 Segmental and somatic dysfunction of lumbar region: Secondary | ICD-10-CM | POA: Diagnosis not present

## 2020-09-07 ENCOUNTER — Encounter: Payer: Self-pay | Admitting: Internal Medicine

## 2020-10-11 DIAGNOSIS — D239 Other benign neoplasm of skin, unspecified: Secondary | ICD-10-CM | POA: Diagnosis not present

## 2020-10-11 DIAGNOSIS — L738 Other specified follicular disorders: Secondary | ICD-10-CM | POA: Diagnosis not present

## 2020-10-11 DIAGNOSIS — L298 Other pruritus: Secondary | ICD-10-CM | POA: Diagnosis not present

## 2020-11-08 ENCOUNTER — Ambulatory Visit: Payer: BC Managed Care – PPO | Admitting: Internal Medicine

## 2020-11-08 ENCOUNTER — Other Ambulatory Visit: Payer: Self-pay

## 2020-11-08 ENCOUNTER — Ambulatory Visit: Payer: Self-pay | Admitting: *Deleted

## 2020-11-08 ENCOUNTER — Encounter: Payer: Self-pay | Admitting: Internal Medicine

## 2020-11-08 VITALS — BP 136/80 | HR 62 | Temp 98.4°F | Ht 68.0 in | Wt 282.0 lb

## 2020-11-08 DIAGNOSIS — W5503XA Scratched by cat, initial encounter: Secondary | ICD-10-CM

## 2020-11-08 DIAGNOSIS — S60519A Abrasion of unspecified hand, initial encounter: Secondary | ICD-10-CM | POA: Diagnosis not present

## 2020-11-08 DIAGNOSIS — H6992 Unspecified Eustachian tube disorder, left ear: Secondary | ICD-10-CM | POA: Diagnosis not present

## 2020-11-08 MED ORDER — AMOXICILLIN-POT CLAVULANATE 875-125 MG PO TABS: 1.0000 | ORAL_TABLET | Freq: Two times a day (BID) | ORAL | 0 refills | Status: AC

## 2020-11-08 NOTE — Progress Notes (Signed)
Date:  11/08/2020   Name:  Courtney Robinson   DOB:  1955/11/08   MRN:  [id number]   Chief Complaint: Hand Injury (Patient has 7 cat scratches on her right hand- happened last night. Patient cat scratched her last night while she was trying to pick her up. Cat was startled when it happened. Cat has had rabies vaccine. Painful. Washed with soap and water, put in witch hazel on it, and wrapped in gauze. Happened at 10 pm 11/07/2020) and Ear Pain (Right ear pain.)  Hand Injury  The incident occurred 12 to 24 hours ago (cat scratched hand in multiple places). The incident occurred at home. The pain is present in the right hand. The quality of the pain is described as burning. The pain does not radiate. The pain has been Fluctuating since the incident. Pertinent negatives include no chest pain.  Otalgia  There is pain in the right ear. This is a new problem. The problem has been unchanged. There has been no fever. Pertinent negatives include no coughing or headaches.   Immunization History  Administered Date(s) Administered   H1N1 06/30/2008   Influenza Whole 12/21/2009   Influenza,inj,Quad PF,6+ Mos 01/19/2015, 02/06/2017, 01/30/2018, 12/10/2018, 12/15/2019   Janssen (J&J) SARS-COV-2 Vaccination 06/26/2019   Td 04/09/2007   Tdap 12/10/2018   Zoster Recombinat (Shingrix) 04/08/2019, 10/07/2019    Lab Results  Component Value Date   CREATININE 0.91 08/15/2020   BUN 16 08/15/2020   NA 140 08/15/2020   K 4.8 08/15/2020   CL 104 08/15/2020   CO2 19 (L) 08/15/2020   Lab Results  Component Value Date   CHOL 202 (H) 08/15/2020   HDL 60 08/15/2020   LDLCALC 117 (H) 08/15/2020   TRIG 143 08/15/2020   CHOLHDL 3.4 08/15/2020   Lab Results  Component Value Date   TSH 1.889 12/15/2019   Lab Results  Component Value Date   HGBA1C 5.9 (H) 08/15/2020   Lab Results  Component Value Date   WBC 6.2 12/15/2019   HGB 13.2 12/15/2019   HCT 40.2 12/15/2019   MCV 92.4 12/15/2019    PLT 249 12/15/2019   Lab Results  Component Value Date   ALT 16 12/15/2019   AST 19 12/15/2019   ALKPHOS 63 12/15/2019   BILITOT 0.6 12/15/2019     Review of Systems  Constitutional:  Negative for chills, fatigue and fever.  HENT:  Positive for ear pain.   Respiratory:  Negative for cough, chest tightness, shortness of breath and wheezing.   Cardiovascular:  Negative for chest pain.  Skin:  Positive for wound.  Neurological:  Negative for dizziness and headaches.   Patient Active Problem List   Diagnosis Date Noted   HSV (herpes simplex virus) infection 12/15/2019   Rectocele 07/27/2019   Arthralgia of left temporomandibular joint 10/15/2017   Pre-diabetes 08/16/2017   Environmental and seasonal allergies 07/20/2016   Blepharospasm 07/10/2016   Left knee pain 08/12/2015   History of palpitations 08/12/2015   Essential (primary) hypertension 01/17/2015   Arthralgia of hand 01/17/2015   Adult BMI 40.0-44.9 kg/sq m ([place]) 12/30/2009   Mixed hyperlipidemia 03/25/2009    Allergies  Allergen Reactions   Hydrochlorothiazide Shortness Of Breath, Swelling and Other (See Comments)    Patient states throat swelled up, felt SOB, chest pain, and tingling in arms and shoulders.   Amlodipine    Hydrocodone    Codeine Nausea And Vomiting   Lisinopril Cough   Loratadine Palpitations  Metoprolol Other (See Comments)    bradycardia   Olmesartan Rash    Past Surgical History:  Procedure Laterality Date   CHOLECYSTECTOMY  2013   ENDOMETRIAL BIOPSY  11/16/09   Disordered Proliferative Endometrium, negative for hyperplasia, atypia or malignancy    Social History   Tobacco Use   Smoking status: Never   Smokeless tobacco: Never  Vaping Use   Vaping Use: Never used  Substance Use Topics   Alcohol use: No    Alcohol/week: 0.0 standard drinks   Drug use: No     Medication list has been reviewed and updated.  Current Meds  Medication Sig   Ascorbic Acid (VITAMIN C) 1000 MG  tablet Take 1,000 mg by mouth daily.   BYSTOLIC 5 MG tablet Take 1 tablet (5 mg total) by mouth daily.   clotrimazole-betamethasone (LOTRISONE) cream Apply 1 application topically 2 (two) times daily. To perineal rash   D-Mannose POWD Take by mouth.   Gymnema Sylvestris Leaf POWD by Does not apply route.   lactobacillus acidophilus (BACID) TABS tablet Take 2 tablets by mouth 3 (three) times daily.   Lido-Menthol-Methyl Sal-Camph (CBD KINGS EX) Apply topically.   Magnesium 300 MG CAPS Take 2 capsules by mouth daily.   TRACE MINERALS CR-CU-MN-ZN IV Inject into the vein.   UNABLE TO FIND A-F Betafood   valACYclovir (VALTREX) 1000 MG tablet Take 1 tablet (1,000 mg total) by mouth 2 (two) times daily. 2 tabs every 12 hours for 2 doses as needed    PHQ 2/9 Scores 11/08/2020 08/15/2020 10/07/2019 07/27/2019  PHQ - 2 Score 0 0 0 0  PHQ- 9 Score 0 0 0 0    GAD 7 : Generalized Anxiety Score 11/08/2020 08/15/2020 10/07/2019 07/27/2019  Nervous, Anxious, on Edge 0 0 0 0  Control/stop worrying 0 0 0 0  Worry too much - different things 0 0 0 0  Trouble relaxing 0 0 0 0  Restless 0 0 0 0  Easily annoyed or irritable 0 0 0 0  Afraid - awful might happen 0 0 0 0  Total GAD 7 Score 0 0 0 0  Anxiety Difficulty Not difficult at all - Not difficult at all Not difficult at all    BP Readings from Last 3 Encounters:  11/08/20 136/80  08/15/20 122/82  12/15/19 128/70    Physical Exam Vitals and nursing note reviewed.  Constitutional:      General: She is not in acute distress.    Appearance: Normal appearance. She is well-developed.  HENT:     Head: Normocephalic and atraumatic.     Right Ear: No middle ear effusion. Tympanic membrane is not erythematous or retracted.     Left Ear:  No middle ear effusion. Tympanic membrane is not erythematous or retracted.  Cardiovascular:     Rate and Rhythm: Normal rate and regular rhythm.     Pulses: Normal pulses.     Heart sounds: No murmur heard. Pulmonary:      Effort: Pulmonary effort is normal. No respiratory distress.     Breath sounds: No wheezing or rhonchi.  Musculoskeletal:     Cervical back: Normal range of motion.  Lymphadenopathy:     Cervical: No cervical adenopathy.  Skin:    General: Skin is warm and dry.     Findings: Wound present. No rash.     Comments: Two moderately deep scratches on right palm and three shorter shallow scratches on forearm.  No bleeding; no surrounding skin inflammation  or streaking  Neurological:     Mental Status: She is alert and oriented to person, place, and time.  Psychiatric:        Mood and Affect: Mood normal.        Behavior: Behavior normal.    Wt Readings from Last 3 Encounters:  11/08/20 282 lb (127.9 kg)  08/15/20 278 lb (126.1 kg)  12/15/19 275 lb (124.7 kg)    BP 136/80 (BP Location: Left Arm, Patient Position: Sitting, Cuff Size: Large)   Pulse 62   Temp 98.4 F (36.9 C) (Oral)   Ht '5\' 8"'$  (1.727 m)   Wt 282 lb (127.9 kg)   SpO2 98%   BMI 42.88 kg/m   Assessment and Plan: 1. Eustachian tube disorder, left Continue Claritin, Flonase or similar, can also try Afrin No evidence of infection  2. Cat scratch of hand, initial encounter Continue local care, TAO and keep lesions loosely covered - amoxicillin-clavulanate (AUGMENTIN) 875-125 MG tablet; Take 1 tablet by mouth 2 (two) times daily for 10 days.  Dispense: 20 tablet; Refill: 0   Partially dictated using Editor, commissioning. Any errors are unintentional.  Halina Maidens, MD [facility]  11/08/2020

## 2020-11-08 NOTE — Progress Notes (Signed)
Date:  11/08/2020   Name:  Courtney Robinson   DOB:  [birth year]   MRN:  [id number]   Chief Complaint: Hand Injury (Patient has 7 cat scratches on her right hand- happened last night. Patient cat scratched her last night while she was trying to pick her up. Cat was startled when it happened. Cat has had rabies vaccine. Painful. Washed with soap and water, put in witch hazel on it, and wrapped in gauze. Happened at 10 pm 11/07/2020) and Ear Pain (Right ear pain.)  Hand Injury  The incident occurred 6 to 12 hours ago. The incident occurred at home. The pain is present in the right hand. The quality of the pain is described as burning and aching. The pain is moderate. The pain has been Constant since the incident.  Otalgia  There is pain in the left ear. This is a new problem. The current episode started 1 to 4 weeks ago. The problem occurs constantly. The problem has been unchanged. There has been no fever. The pain is moderate.   Lab Results  Component Value Date   CREATININE 0.91 08/15/2020   BUN 16 08/15/2020   NA 140 08/15/2020   K 4.8 08/15/2020   CL 104 08/15/2020   CO2 19 (L) 08/15/2020   Lab Results  Component Value Date   CHOL 202 (H) 08/15/2020   HDL 60 08/15/2020   LDLCALC 117 (H) 08/15/2020   TRIG 143 08/15/2020   CHOLHDL 3.4 08/15/2020   Lab Results  Component Value Date   TSH 1.889 12/15/2019   Lab Results  Component Value Date   HGBA1C 5.9 (H) 08/15/2020   Lab Results  Component Value Date   WBC 6.2 12/15/2019   HGB 13.2 12/15/2019   HCT 40.2 12/15/2019   MCV 92.4 12/15/2019   PLT 249 12/15/2019   Lab Results  Component Value Date   ALT 16 12/15/2019   AST 19 12/15/2019   ALKPHOS 63 12/15/2019   BILITOT 0.6 12/15/2019     Review of Systems  HENT:  Positive for ear pain.    Patient Active Problem List   Diagnosis Date Noted   HSV (herpes simplex virus) infection 12/15/2019   Rectocele 07/27/2019   Arthralgia of left temporomandibular  joint 10/15/2017   Pre-diabetes 08/16/2017   Environmental and seasonal allergies 07/20/2016   Blepharospasm 07/10/2016   Left knee pain 08/12/2015   History of palpitations 08/12/2015   Essential (primary) hypertension 01/17/2015   Arthralgia of hand 01/17/2015   Adult BMI 40.0-44.9 kg/sq m ([place]) 12/30/2009   Mixed hyperlipidemia 03/25/2009    Allergies  Allergen Reactions   Hydrochlorothiazide Shortness Of Breath, Swelling and Other (See Comments)    Patient states throat swelled up, felt SOB, chest pain, and tingling in arms and shoulders.   Amlodipine    Hydrocodone    Codeine Nausea And Vomiting   Lisinopril Cough   Loratadine Palpitations   Metoprolol Other (See Comments)    bradycardia   Olmesartan Rash    Past Surgical History:  Procedure Laterality Date   CHOLECYSTECTOMY  2013   ENDOMETRIAL BIOPSY  11/16/09   Disordered Proliferative Endometrium, negative for hyperplasia, atypia or malignancy    Social History   Tobacco Use   Smoking status: Never   Smokeless tobacco: Never  Vaping Use   Vaping Use: Never used  Substance Use Topics   Alcohol use: No    Alcohol/week: 0.0 standard drinks   Drug use: No  Medication list has been reviewed and updated.  Current Meds  Medication Sig   Ascorbic Acid (VITAMIN C) 1000 MG tablet Take 1,000 mg by mouth daily.   BYSTOLIC 5 MG tablet Take 1 tablet (5 mg total) by mouth daily.   clotrimazole-betamethasone (LOTRISONE) cream Apply 1 application topically 2 (two) times daily. To perineal rash   D-Mannose POWD Take by mouth.   Gymnema Sylvestris Leaf POWD by Does not apply route.   lactobacillus acidophilus (BACID) TABS tablet Take 2 tablets by mouth 3 (three) times daily.   Lido-Menthol-Methyl Sal-Camph (CBD KINGS EX) Apply topically.   Magnesium 300 MG CAPS Take 2 capsules by mouth daily.   TRACE MINERALS CR-CU-MN-ZN IV Inject into the vein.   UNABLE TO FIND A-F Betafood   valACYclovir (VALTREX) 1000 MG tablet  Take 1 tablet (1,000 mg total) by mouth 2 (two) times daily. 2 tabs every 12 hours for 2 doses as needed    PHQ 2/9 Scores 11/08/2020 08/15/2020 10/07/2019 07/27/2019  PHQ - 2 Score 0 0 0 0  PHQ- 9 Score 0 0 0 0    GAD 7 : Generalized Anxiety Score 11/08/2020 08/15/2020 10/07/2019 07/27/2019  Nervous, Anxious, on Edge 0 0 0 0  Control/stop worrying 0 0 0 0  Worry too much - different things 0 0 0 0  Trouble relaxing 0 0 0 0  Restless 0 0 0 0  Easily annoyed or irritable 0 0 0 0  Afraid - awful might happen 0 0 0 0  Total GAD 7 Score 0 0 0 0  Anxiety Difficulty Not difficult at all - Not difficult at all Not difficult at all    BP Readings from Last 3 Encounters:  11/08/20 136/80  08/15/20 122/82  12/15/19 128/70    Physical Exam  Wt Readings from Last 3 Encounters:  11/08/20 282 lb (127.9 kg)  08/15/20 278 lb (126.1 kg)  12/15/19 275 lb (124.7 kg)    BP 136/80 (BP Location: Left Arm, Patient Position: Sitting, Cuff Size: Large)   Pulse 62   Temp 98.4 F (36.9 C) (Oral)   Ht '5\' 8"'$  (1.727 m)   Wt 282 lb (127.9 kg)   SpO2 98%   BMI 42.88 kg/m   Assessment and Plan:

## 2020-11-08 NOTE — Telephone Encounter (Signed)
Reason for Disposition  [1] Puncture wound or small cut AND [2] on hands or genitals (Exception: puncture  from small pet such as gerbil, mouse, hamster, puppy or turtle)    7 cat scratches on her right hand from last night  Answer Assessment - Initial Assessment Questions 1. ANIMAL: "What type of animal caused the bite?" "Is the injury from a bite or a claw?" If the animal is a dog or a cat, ask: "Was it a pet or a stray?" "Was it acting ill or behaving strangely?"     My cat scratched my right hand 5 scratches.   I need a tetanus shot.   My cat is up to date on her shots. 2. LOCATION: "Where is the bite located?"      Right hand 3. SIZE: "How big is the bite?" "What does it look like?"      5 scratches 4. ONSET: "When did the bite happen?" (Minutes or hours ago)      Last night 10:00 PM 5. CIRCUMSTANCES: "Tell me how this happened."      I picked her up she spooked and scratched.   6. TETANUS: "When was the last tetanus booster?"     I'm not sure 7. PREGNANCY: "Is there any chance you are pregnant?" "When was your last menstrual period?"     N/A  Protocols used: Animal Bite-A-AH

## 2020-11-08 NOTE — Telephone Encounter (Signed)
Pt called in with 7 cat scratches to her right hand that happened last night at 10:00.   She picked her cat up and she must have gotten spooked.  She scratched my right hand with her hind feet 7 places.   None of them are deep or bleeding.   I washed my hand good last night and put antimicrobial ointment on the scratches.     I just want to be sure they don't get infected.   I'm not sure when my last tetanus shot was.  It was given there at Dr. Gaspar Cola office.  I made her an in office appt with Dr. Army Melia for today at 1:20.  She thanked me for my help.

## 2020-11-17 ENCOUNTER — Other Ambulatory Visit: Payer: Self-pay | Admitting: Internal Medicine

## 2020-11-17 DIAGNOSIS — Z1231 Encounter for screening mammogram for malignant neoplasm of breast: Secondary | ICD-10-CM

## 2020-11-21 DIAGNOSIS — M9902 Segmental and somatic dysfunction of thoracic region: Secondary | ICD-10-CM | POA: Diagnosis not present

## 2020-11-21 DIAGNOSIS — M6240 Contracture of muscle, unspecified site: Secondary | ICD-10-CM | POA: Diagnosis not present

## 2020-11-21 DIAGNOSIS — M2560 Stiffness of unspecified joint, not elsewhere classified: Secondary | ICD-10-CM | POA: Diagnosis not present

## 2020-11-21 DIAGNOSIS — M9903 Segmental and somatic dysfunction of lumbar region: Secondary | ICD-10-CM | POA: Diagnosis not present

## 2020-12-19 ENCOUNTER — Other Ambulatory Visit: Payer: Self-pay

## 2020-12-19 ENCOUNTER — Ambulatory Visit
Admission: RE | Admit: 2020-12-19 | Discharge: 2020-12-19 | Disposition: A | Discharge status: Home / Self Care | Payer: BC Managed Care – PPO | Source: Ambulatory Visit | Attending: Internal Medicine | Admitting: Internal Medicine

## 2020-12-19 DIAGNOSIS — Z1231 Encounter for screening mammogram for malignant neoplasm of breast: Secondary | ICD-10-CM | POA: Insufficient documentation

## 2020-12-20 ENCOUNTER — Ambulatory Visit (INDEPENDENT_AMBULATORY_CARE_PROVIDER_SITE_OTHER): Payer: BC Managed Care – PPO | Admitting: Internal Medicine

## 2020-12-20 ENCOUNTER — Encounter: Payer: Self-pay | Admitting: Internal Medicine

## 2020-12-20 VITALS — BP 112/78 | HR 57 | Temp 98.2°F | Ht 68.0 in | Wt 282.0 lb

## 2020-12-20 DIAGNOSIS — Z0000 Encounter for general adult medical examination without abnormal findings: Secondary | ICD-10-CM

## 2020-12-20 DIAGNOSIS — Z1211 Encounter for screening for malignant neoplasm of colon: Secondary | ICD-10-CM

## 2020-12-20 DIAGNOSIS — Z23 Encounter for immunization: Secondary | ICD-10-CM

## 2020-12-20 DIAGNOSIS — I10 Essential (primary) hypertension: Secondary | ICD-10-CM

## 2020-12-20 DIAGNOSIS — E782 Mixed hyperlipidemia: Secondary | ICD-10-CM | POA: Diagnosis not present

## 2020-12-20 DIAGNOSIS — L739 Follicular disorder, unspecified: Secondary | ICD-10-CM

## 2020-12-20 DIAGNOSIS — R7303 Prediabetes: Secondary | ICD-10-CM | POA: Diagnosis not present

## 2020-12-20 LAB — POCT URINALYSIS DIPSTICK
Bilirubin, UA: NEGATIVE
Blood, UA: NEGATIVE
Glucose, UA: NEGATIVE
Ketones, UA: NEGATIVE
Nitrite, UA: NEGATIVE
Protein, UA: NEGATIVE
Spec Grav, UA: 1.015 (ref 1.010–1.025)
Urobilinogen, UA: 0.2 E.U./dL
pH, UA: 6 (ref 5.0–8.0)

## 2020-12-20 MED ORDER — AMOXICILLIN-POT CLAVULANATE 875-125 MG PO TABS: 1.0000 | ORAL_TABLET | Freq: Two times a day (BID) | ORAL | 0 refills | Status: AC

## 2020-12-20 NOTE — Progress Notes (Signed)
Date:  12/20/2020   Name:  Courtney Mar Marcia Marotz   DOB:  [birth year]   MRN:  [id number]   Chief Complaint: Annual Exam (Breast exam no pap ), Mass (X1 week, Bump in vaginal area and rectum bump, no burning or itching, painful, feels like ingrown hair), and Flu Vaccine Courtney Robinson is a 65 y.o. female who presents today for her Complete Annual Exam. She feels well. She reports exercising yoga 3 times a week. She reports she is sleeping well. Breast complaints none.  Mammogram: 12/2020 DEXA: 04/2020 osteopenia hip/normal spine Pap smear:  07/2019 neg with co-testing Colonoscopy: 05/2010 due; wants to do Cologuard  Immunization History  Administered Date(s) Administered   H1N1 06/30/2008   Influenza Whole 12/21/2009   Influenza,inj,Quad PF,6+ Mos 01/19/2015, 02/06/2017, 01/30/2018, 12/10/2018, 12/15/2019   Janssen (J&J) SARS-COV-2 Vaccination 06/26/2019   Moderna Covid-19 Vaccine Bivalent Booster 27yr & up 02/09/2020, 12/13/2020   Td 04/09/2007   Tdap 12/10/2018   Zoster Recombinat (Shingrix) 04/08/2019, 10/07/2019    Hypertension This is a chronic problem. The problem is controlled. Pertinent negatives include no chest pain, headaches, palpitations or shortness of breath. Past treatments include beta blockers. There are no compliance problems.    Lab Results  Component Value Date   CREATININE 0.91 08/15/2020   BUN 16 08/15/2020   NA 140 08/15/2020   K 4.8 08/15/2020   CL 104 08/15/2020   CO2 19 (L) 08/15/2020   Lab Results  Component Value Date   CHOL 202 (H) 08/15/2020   HDL 60 08/15/2020   LDLCALC 117 (H) 08/15/2020   TRIG 143 08/15/2020   CHOLHDL 3.4 08/15/2020   Lab Results  Component Value Date   TSH 1.889 12/15/2019   Lab Results  Component Value Date   HGBA1C 5.9 (H) 08/15/2020   Lab Results  Component Value Date   WBC 6.2 12/15/2019   HGB 13.2 12/15/2019   HCT 40.2 12/15/2019   MCV 92.4 12/15/2019   PLT 249 12/15/2019   Lab Results   Component Value Date   ALT 16 12/15/2019   AST 19 12/15/2019   ALKPHOS 63 12/15/2019   BILITOT 0.6 12/15/2019     Review of Systems  Constitutional:  Negative for chills, fatigue and fever.  HENT:  Negative for congestion, hearing loss, tinnitus, trouble swallowing and voice change.   Eyes:  Negative for visual disturbance.  Respiratory:  Negative for cough, chest tightness, shortness of breath and wheezing.   Cardiovascular:  Negative for chest pain, palpitations and leg swelling.  Gastrointestinal:  Negative for abdominal pain, constipation, diarrhea and vomiting.  Endocrine: Negative for polydipsia and polyuria.  Genitourinary:  Negative for dysuria, frequency, genital sores, vaginal bleeding and vaginal discharge.  Musculoskeletal:  Negative for arthralgias, gait problem and joint swelling.  Skin:  Negative for color change and rash.  Neurological:  Negative for dizziness, tremors, light-headedness and headaches.  Hematological:  Negative for adenopathy. Does not bruise/bleed easily.  Psychiatric/Behavioral:  Negative for dysphoric mood and sleep disturbance. The patient is not nervous/anxious.    Patient Active Problem List   Diagnosis Date Noted   HSV (herpes simplex virus) infection 12/15/2019   Rectocele 07/27/2019   Arthralgia of left temporomandibular joint 10/15/2017   Pre-diabetes 08/16/2017   Environmental and seasonal allergies 07/20/2016   Blepharospasm 07/10/2016   Left knee pain 08/12/2015   History of palpitations 08/12/2015   Essential (primary) hypertension 01/17/2015   Arthralgia of hand 01/17/2015   Adult BMI 40.0-44.9  kg/sq m ([place]) 12/30/2009   Mixed hyperlipidemia 03/25/2009    Allergies  Allergen Reactions   Hydrochlorothiazide Shortness Of Breath, Swelling and Other (See Comments)    Patient states throat swelled up, felt SOB, chest pain, and tingling in arms and shoulders.   Amlodipine    Hydrocodone    Codeine Nausea And Vomiting    Lisinopril Cough   Loratadine Palpitations   Metoprolol Other (See Comments)    bradycardia   Olmesartan Rash    Past Surgical History:  Procedure Laterality Date   CHOLECYSTECTOMY  2013   ENDOMETRIAL BIOPSY  11/16/09   Disordered Proliferative Endometrium, negative for hyperplasia, atypia or malignancy    Social History   Tobacco Use   Smoking status: Never   Smokeless tobacco: Never  Vaping Use   Vaping Use: Never used  Substance Use Topics   Alcohol use: No    Alcohol/week: 0.0 standard drinks   Drug use: No     Medication list has been reviewed and updated.  Current Meds  Medication Sig   Ascorbic Acid (VITAMIN C) 1000 MG tablet Take 1,000 mg by mouth daily.   BYSTOLIC 5 MG tablet Take 1 tablet (5 mg total) by mouth daily.   clotrimazole-betamethasone (LOTRISONE) cream Apply 1 application topically 2 (two) times daily. To perineal rash   D-Mannose POWD Take by mouth.   Gymnema Sylvestris Leaf POWD by Does not apply route.   lactobacillus acidophilus (BACID) TABS tablet Take 2 tablets by mouth 3 (three) times daily.   Lido-Menthol-Methyl Sal-Camph (CBD KINGS EX) Apply topically.   Magnesium 300 MG CAPS Take 2 capsules by mouth daily.   [employer] [id number] IV Inject into the vein.   UNABLE TO FIND A-F Betafood   valACYclovir (VALTREX) 1000 MG tablet Take 1 tablet (1,000 mg total) by mouth 2 (two) times daily. 2 tabs every 12 hours for 2 doses as needed    PHQ 2/9 Scores 11/08/2020 11/08/2020 08/15/2020 10/07/2019  PHQ - 2 Score 0 0 0 0  PHQ- 9 Score 0 0 0 0    GAD 7 : Generalized Anxiety Score 11/08/2020 11/08/2020 08/15/2020 10/07/2019  Nervous, Anxious, on Edge 0 0 0 0  Control/stop worrying 0 0 0 0  Worry too much - different things 0 0 0 0  Trouble relaxing 0 0 0 0  Restless 0 0 0 0  Easily annoyed or irritable 0 0 0 0  Afraid - awful might happen 0 0 0 0  Total GAD 7 Score 0 0 0 0  Anxiety Difficulty Not difficult at all Not difficult at all - Not difficult  at all    BP Readings from Last 3 Encounters:  12/20/20 112/78  11/08/20 136/80  08/15/20 122/82    Physical Exam Vitals and nursing note reviewed.  Constitutional:      General: She is not in acute distress.    Appearance: She is well-developed.  HENT:     Head: Normocephalic and atraumatic.     Right Ear: Tympanic membrane and ear canal normal.     Left Ear: Tympanic membrane and ear canal normal.     Nose:     Right Sinus: No maxillary sinus tenderness.     Left Sinus: No maxillary sinus tenderness.  Eyes:     General: No scleral icterus.       Right eye: No discharge.        Left eye: No discharge.     Conjunctiva/sclera: Conjunctivae normal.  Neck:  Thyroid: No thyromegaly.     Vascular: No carotid bruit.  Cardiovascular:     Rate and Rhythm: Normal rate and regular rhythm.     Pulses: Normal pulses.     Heart sounds: Normal heart sounds.  Pulmonary:     Effort: Pulmonary effort is normal. No respiratory distress.     Breath sounds: No wheezing.  Chest:  Breasts:    Right: No mass, nipple discharge, skin change or tenderness.     Left: No mass, nipple discharge, skin change or tenderness.  Abdominal:     General: Bowel sounds are normal.     Palpations: Abdomen is soft.     Tenderness: There is no abdominal tenderness.  Genitourinary:   Musculoskeletal:     Cervical back: Normal range of motion. No erythema.     Right lower leg: No edema.     Left lower leg: No edema.  Lymphadenopathy:     Cervical: No cervical adenopathy.  Skin:    General: Skin is warm and dry.     Findings: No rash.  Neurological:     Mental Status: She is alert and oriented to person, place, and time.     Cranial Nerves: No cranial nerve deficit.     Sensory: No sensory deficit.     Deep Tendon Reflexes: Reflexes are normal and symmetric.  Psychiatric:        Attention and Perception: Attention normal.        Mood and Affect: Mood normal.    Wt Readings from Last 3  Encounters:  12/20/20 282 lb (127.9 kg)  11/08/20 282 lb (127.9 kg)  08/15/20 278 lb (126.1 kg)    BP 112/78   Pulse (!) 57   Temp 98.2 F (36.8 C) (Oral)   Ht '5\' 8"'$  (1.727 m)   Wt 282 lb (127.9 kg)   SpO2 98%   BMI 42.88 kg/m   Assessment and Plan: 1. Annual physical exam Exam is normal except for weight. Encourage regular exercise and appropriate dietary changes.  2. Colon cancer screening - Cologuard  3. Essential (primary) hypertension Clinically stable exam with well controlled BP. Tolerating medications without side effects at this time. Pt to continue current regimen and low sodium diet; benefits of regular exercise as able discussed. - CBC with Differential/Platelet - Comprehensive metabolic panel - POCT urinalysis dipstick - TSH  4. Mixed hyperlipidemia - Lipid panel  5. Pre-diabetes Continue diet efforts - Hemoglobin A1c  6. Need for vaccination for pneumococcus - Pneumococcal conjugate vaccine 20-valent  7. Folliculitis Local care, warm soaks - amoxicillin-clavulanate (AUGMENTIN) 875-125 MG tablet; Take 1 tablet by mouth 2 (two) times daily for 10 days.  Dispense: 20 tablet; Refill: 0  8. Need for immunization against influenza - Flu Vaccine QUAD High Dose(Fluad)   Partially dictated using Editor, commissioning. Any errors are unintentional.  Halina Maidens, MD [facility]  12/20/2020

## 2020-12-21 ENCOUNTER — Encounter: Payer: Self-pay | Admitting: Internal Medicine

## 2020-12-21 ENCOUNTER — Other Ambulatory Visit: Payer: Self-pay | Admitting: Internal Medicine

## 2020-12-21 DIAGNOSIS — R7303 Prediabetes: Secondary | ICD-10-CM

## 2020-12-21 LAB — COMPREHENSIVE METABOLIC PANEL
ALT: 12 IU/L (ref 0–32)
AST: 14 IU/L (ref 0–40)
Albumin/Globulin Ratio: 1.3 (ref 1.2–2.2)
Albumin: 4.3 g/dL (ref 3.8–4.8)
Alkaline Phosphatase: 87 IU/L (ref 44–121)
BUN/Creatinine Ratio: 16 (ref 12–28)
BUN: 16 mg/dL (ref 8–27)
Bilirubin Total: 0.3 mg/dL (ref 0.0–1.2)
CO2: 24 mmol/L (ref 20–29)
Calcium: 9.7 mg/dL (ref 8.7–10.3)
Chloride: 104 mmol/L (ref 96–106)
Creatinine, Ser: 1 mg/dL (ref 0.57–1.00)
Globulin, Total: 3.3 g/dL (ref 1.5–4.5)
Glucose: 109 mg/dL — ABNORMAL HIGH (ref 65–99)
Potassium: 4.4 mmol/L (ref 3.5–5.2)
Sodium: 140 mmol/L (ref 134–144)
Total Protein: 7.6 g/dL (ref 6.0–8.5)
eGFR: 63 mL/min/{1.73_m2} (ref >59)

## 2020-12-21 LAB — CBC WITH DIFFERENTIAL/PLATELET
Basophils Absolute: 0.1 10*3/uL (ref 0.0–0.2)
Basos: 1 %
EOS (ABSOLUTE): 0.2 10*3/uL (ref 0.0–0.4)
Eos: 3 %
Hematocrit: 39.9 % (ref 34.0–46.6)
Hemoglobin: 13.5 g/dL (ref 11.1–15.9)
Immature Grans (Abs): 0 10*3/uL (ref 0.0–0.1)
Immature Granulocytes: 0 %
Lymphocytes Absolute: 2.1 10*3/uL (ref 0.7–3.1)
Lymphs: 29 %
MCH: 29.7 pg (ref 26.6–33.0)
MCHC: 33.8 g/dL (ref 31.5–35.7)
MCV: 88 fL (ref 79–97)
Monocytes Absolute: 0.7 10*3/uL (ref 0.1–0.9)
Monocytes: 9 %
Neutrophils Absolute: 4.2 10*3/uL (ref 1.4–7.0)
Neutrophils: 58 %
Platelets: 267 10*3/uL (ref 150–450)
RBC: 4.54 x10E6/uL (ref 3.77–5.28)
RDW: 12.3 % (ref 11.7–15.4)
WBC: 7.3 10*3/uL (ref 3.4–10.8)

## 2020-12-21 LAB — LIPID PANEL
Chol/HDL Ratio: 3.6 ratio (ref 0.0–4.4)
Cholesterol, Total: 215 mg/dL — ABNORMAL HIGH (ref 100–199)
HDL: 59 mg/dL (ref >39)
LDL Chol Calc (NIH): 127 mg/dL — ABNORMAL HIGH (ref 0–99)
Triglycerides: 167 mg/dL — ABNORMAL HIGH (ref 0–149)
VLDL Cholesterol Cal: 29 mg/dL (ref 5–40)

## 2020-12-21 LAB — TSH: TSH: 2.21 u[IU]/mL (ref 0.450–4.500)

## 2020-12-21 LAB — HEMOGLOBIN A1C
Est. average glucose Bld gHb Est-mCnc: 134 mg/dL
Hgb A1c MFr Bld: 6.3 % — ABNORMAL HIGH (ref 4.8–5.6)

## 2020-12-26 ENCOUNTER — Other Ambulatory Visit: Payer: Self-pay

## 2020-12-26 ENCOUNTER — Encounter: Payer: BC Managed Care – PPO | Attending: Internal Medicine | Admitting: Dietician

## 2020-12-26 ENCOUNTER — Encounter: Payer: Self-pay | Admitting: Dietician

## 2020-12-26 VITALS — Ht 67.0 in | Wt 283.5 lb

## 2020-12-26 DIAGNOSIS — R7303 Prediabetes: Secondary | ICD-10-CM | POA: Diagnosis not present

## 2020-12-26 NOTE — Patient Instructions (Signed)
Great job making healthy food choices! Keep it up! Aim for at least 15g carb with each meal or snack; ideally 30-45g of whole grain/ high fiber choices.  Have something to eat every 3-5 hours during the day.

## 2020-12-26 NOTE — Progress Notes (Signed)
Medical Nutrition Therapy: Visit start time: [id number]  end time: [id number]  Assessment:  Diagnosis: pre-diabetes Past medical history: HTN  Psychosocial issues/ stress concerns: none  Preferred learning method:  Auditory Visual Hands-on   Current weight: 283.5lbs Height: 5'7" BMIL 44.4 Medications, supplements: reconciled list in medical record  Progress and evaluation:  Recent lab results indicate HbA1C of 6.3% (12/20/20); previous result was 5.9% (08/15/20)  Patient has worked on weight loss in the past, including [employer] and [employer] with only limited results.  Reports challenge in tracking food intake, consistent eating habits; typically loves journaling but past efforts to track food have not lasted long. Exercise decreased during pandemic, particularly when gyms closed. She has not yet returned to gym classes; does not like to walk for exercise; does some gardening at home and keeps infant granddaughter 2 days each week.  She reports trying to eat mostly whole foods, clean eating pattern. Patient's goal for weight loss is 5-10lbs by end of December. Her goal overall is to lose about 80lbs. Weight loss has been a struggle despite healthy lifestyle habits.  Physical activity: yoga, nia dance 45-60 minutes 3 times a week. Plans to rejoin gym -- group classes; has trail on home property  Dietary Intake:  Usual eating pattern includes 2 meals and 2-3 snacks per day. Dining out frequency: 2 meals per week.  Breakfast: sometimes skips; fruit smoothie-- blueberry + greens, with omelet with green peppers Snack: none Lunch: sometimes skips due to lack of hunger; leftovers ie fish + veg; soup or stew Snack: chips; salty snacks; chocolate Supper: baked chicken or fish + veg; stew with veg and herbs. Snack: same as pm Beverages: water,some infused with fruit or cucumber; 1c. coffee with soy or oat milk creamer  Nutrition Care Education: Topics covered:  Basic nutrition: basic food  groups, appropriate nutrient balance, appropriate meal and snack schedule, general nutrition guidelines    Weight control: determining reasonable weight loss rate; importance of low sugar and low fat choices; portion control; estimated energy needs for weight loss at 1500 kcal, provided guidance for 45% CHO, 25% protein Pre-Diabetes:  goal for HbA1C; appropriate meal and snack schedule, appropriate carb intake and balance; healthy carb choices; role of fiber, protein, fat Hypertension: identifying high sodium foods, identifying food sources of potassium, magnesium   Nutritional Diagnosis:  [place]-2.2 Altered nutrition-related laboratory As related to pre-diabetes.  As evidenced by elevated HbA1C. [place]-3.3 Overweight/obesity As related to history of excess calories and inadequate physical activity.  As evidenced by patient with current BMI of 44, following healthy diet pattern and increasing activity, to prevent diabetes and lose weight.  Intervention:  Instruction and discussion as noted above. Patient is following a generally healthy eating pattern, and is motivated to continue. She will plan to seek out a weight management group/ program with regular meetings/ weigh-ins.  No MNT follow-up scheduled at this time; patient to schedule later if needed.  Education Materials given:  General diet guidelines for Diabetes (prevention) Plate Planner with food lists, sample meal pattern Sample menus Visit summary with goals/ instructions   Learner/ who was taught:  Patient    Level of understanding: Verbalizes/ demonstrates competency   Demonstrated degree of understanding via:   Teach back Learning barriers: None  Willingness to learn/ readiness for change: Eager, change in progress   Monitoring and Evaluation:  Dietary intake, exercise, BG control, and body weight      follow up: prn

## 2021-01-09 DIAGNOSIS — Z1211 Encounter for screening for malignant neoplasm of colon: Secondary | ICD-10-CM | POA: Diagnosis not present

## 2021-01-13 LAB — COLOGUARD: Cologuard: NEGATIVE

## 2021-01-17 ENCOUNTER — Other Ambulatory Visit: Payer: Self-pay | Admitting: Internal Medicine

## 2021-01-17 DIAGNOSIS — I10 Essential (primary) hypertension: Secondary | ICD-10-CM

## 2021-01-18 NOTE — Telephone Encounter (Signed)
Requested Prescriptions  Pending Prescriptions Disp Refills  . BYSTOLIC 5 MG tablet [Pharmacy Med Name: BYSTOLIC 5 MG TABLET] 90 tablet 1    Sig: TAKE 1 TABLET (5 MG TOTAL) BY MOUTH DAILY.     Cardiovascular:  Beta Blockers Passed - 01/17/2021  2:22 PM      Passed - Last BP in normal range    BP Readings from Last 1 Encounters:  12/20/20 112/78         Passed - Last Heart Rate in normal range    Pulse Readings from Last 1 Encounters:  12/20/20 (!) 57         Passed - Valid encounter within last 6 months    Recent Outpatient Visits          4 weeks ago Annual physical exam   [facility] Glean Hess, MD   2 months ago Eustachian tube disorder, left   [facility] Glean Hess, MD   5 months ago Essential (primary) hypertension   [facility] Glean Hess, MD   1 year ago Annual physical exam   [facility] Glean Hess, MD   1 year ago Rash and nonspecific skin eruption   [facility] Glean Hess, MD      Future Appointments            In 58 months [employer] Melia Jesse Sans, MD [facility], [facility]

## 2021-02-20 ENCOUNTER — Encounter: Payer: Self-pay | Admitting: Internal Medicine

## 2021-02-20 ENCOUNTER — Other Ambulatory Visit: Payer: Self-pay

## 2021-02-20 DIAGNOSIS — I10 Essential (primary) hypertension: Secondary | ICD-10-CM

## 2021-02-20 MED ORDER — BYSTOLIC 5 MG PO TABS: 5.0000 mg | ORAL_TABLET | Freq: Every day | ORAL | 1 refills | Status: DC

## 2021-03-13 ENCOUNTER — Ambulatory Visit: Payer: BC Managed Care – PPO | Admitting: Internal Medicine

## 2021-03-13 ENCOUNTER — Encounter: Payer: Self-pay | Admitting: Internal Medicine

## 2021-03-13 ENCOUNTER — Other Ambulatory Visit: Payer: Self-pay

## 2021-03-13 VITALS — BP 138/76 | HR 51 | Temp 97.8°F | Ht 68.0 in

## 2021-03-13 DIAGNOSIS — J0100 Acute maxillary sinusitis, unspecified: Secondary | ICD-10-CM | POA: Diagnosis not present

## 2021-03-13 DIAGNOSIS — I10 Essential (primary) hypertension: Secondary | ICD-10-CM | POA: Diagnosis not present

## 2021-03-13 MED ORDER — AMOXICILLIN-POT CLAVULANATE 875-125 MG PO TABS: 1.0000 | ORAL_TABLET | Freq: Two times a day (BID) | ORAL | 0 refills | Status: AC

## 2021-03-13 NOTE — Progress Notes (Signed)
Date:  03/13/2021   Name:  Courtney Robinson   DOB:  May 23, 1955   MRN:  [id number]   Chief Complaint: Sinusitis  Sinusitis This is a new problem. Episode onset: 2.5 weeks. The problem has been gradually worsening since onset. There has been no fever. Her pain is at a severity of 0/10. She is experiencing no pain. Associated symptoms include congestion, coughing, ear pain, headaches, sinus pressure, sneezing and a sore throat. Pertinent negatives include no chills. Past treatments include saline sprays (Salt water, lemon and hot tea plus peppermint).  Sx started with cold like symptoms before Thanksgiving.  Then she went to [place] on a corporate trip.  Ears were popping on the flight and now having yellow sinus drainage with pressure.  Using herbal and essential oils for sx relief.  Lab Results  Component Value Date   NA 140 12/20/2020   K 4.4 12/20/2020   CO2 24 12/20/2020   GLUCOSE 109 (H) 12/20/2020   BUN 16 12/20/2020   CREATININE 1.00 12/20/2020   CALCIUM 9.7 12/20/2020   EGFR 63 12/20/2020   GFRNONAA 57 (L) 12/15/2019   Lab Results  Component Value Date   CHOL 215 (H) 12/20/2020   HDL 59 12/20/2020   LDLCALC 127 (H) 12/20/2020   TRIG 167 (H) 12/20/2020   CHOLHDL 3.6 12/20/2020   Lab Results  Component Value Date   TSH 2.210 12/20/2020   Lab Results  Component Value Date   HGBA1C 6.3 (H) 12/20/2020   Lab Results  Component Value Date   WBC 7.3 12/20/2020   HGB 13.5 12/20/2020   HCT 39.9 12/20/2020   MCV 88 12/20/2020   PLT 267 12/20/2020   Lab Results  Component Value Date   ALT 12 12/20/2020   AST 14 12/20/2020   ALKPHOS 87 12/20/2020   BILITOT 0.3 12/20/2020   No results found for: 25OHVITD2, 25OHVITD3, VD25OH   Review of Systems  Constitutional:  Positive for fatigue. Negative for chills, fever and unexpected weight change.  HENT:  Positive for congestion, ear pain, sinus pressure, sneezing and sore throat.   Respiratory:  Positive for cough.  Negative for chest tightness and wheezing.   Cardiovascular:  Negative for chest pain.  Gastrointestinal:  Negative for diarrhea, nausea and vomiting.  Neurological:  Positive for headaches.   Patient Active Problem List   Diagnosis Date Noted   HSV (herpes simplex virus) infection 12/15/2019   Rectocele 07/27/2019   Arthralgia of left temporomandibular joint 10/15/2017   Pre-diabetes 08/16/2017   Environmental and seasonal allergies 07/20/2016   Blepharospasm 07/10/2016   Left knee pain 08/12/2015   History of palpitations 08/12/2015   Essential (primary) hypertension 01/17/2015   Arthralgia of hand 01/17/2015   Adult BMI 40.0-44.9 kg/sq m ([place]) 12/30/2009   Mixed hyperlipidemia 03/25/2009    Allergies  Allergen Reactions   Hydrochlorothiazide Shortness Of Breath, Swelling and Other (See Comments)    Patient states throat swelled up, felt SOB, chest pain, and tingling in arms and shoulders.   Amlodipine    Hydrocodone    Codeine Nausea And Vomiting   Lisinopril Cough   Loratadine Palpitations   Metoprolol Other (See Comments)    bradycardia   Olmesartan Rash    Past Surgical History:  Procedure Laterality Date   CHOLECYSTECTOMY  2013   ENDOMETRIAL BIOPSY  11/16/09   Disordered Proliferative Endometrium, negative for hyperplasia, atypia or malignancy    Social History   Tobacco Use   Smoking status: Never  Smokeless tobacco: Never  Vaping Use   Vaping Use: Never used  Substance Use Topics   Alcohol use: No    Alcohol/week: 0.0 standard drinks   Drug use: No     Medication list has been reviewed and updated.  Current Meds  Medication Sig   Ascorbic Acid (VITAMIN C) 1000 MG tablet Take 1,000 mg by mouth daily.   BYSTOLIC 5 MG tablet Take 1 tablet (5 mg total) by mouth daily.   D-Mannose POWD Take by mouth.   UNABLE TO FIND A-F Betafood   valACYclovir (VALTREX) 1000 MG tablet Take 1 tablet (1,000 mg total) by mouth 2 (two) times daily. 2 tabs every 12  hours for 2 doses as needed   [DISCONTINUED] clindamycin (CLEOCIN T) 1 % lotion Apply 1 application topically 2 (two) times daily.   [DISCONTINUED] clobetasol (TEMOVATE) 0.05 % external solution Apply topically 2 (two) times daily.   [DISCONTINUED] Gymnema Sylvestris Leaf POWD by Does not apply route.    PHQ 2/9 Scores 03/13/2021 12/26/2020 12/20/2020 11/08/2020  PHQ - 2 Score 0 0 0 0  PHQ- 9 Score 0 - 0 0    GAD 7 : Generalized Anxiety Score 03/13/2021 12/20/2020 11/08/2020 11/08/2020  Nervous, Anxious, on Edge 0 0 0 0  Control/stop worrying 0 0 0 0  Worry too much - different things 0 0 0 0  Trouble relaxing 0 0 0 0  Restless 0 0 0 0  Easily annoyed or irritable 0 0 0 0  Afraid - awful might happen 0 0 0 0  Total GAD 7 Score 0 0 0 0  Anxiety Difficulty Not difficult at all - Not difficult at all Not difficult at all    BP Readings from Last 3 Encounters:  03/13/21 138/76  12/20/20 112/78  11/08/20 136/80    Physical Exam Vitals and nursing note reviewed.  Constitutional:      General: She is not in acute distress.    Appearance: She is well-developed.  HENT:     Head: Normocephalic and atraumatic.     Right Ear: Ear canal and external ear normal. Tympanic membrane is not erythematous or retracted.     Left Ear: Ear canal and external ear normal. Tympanic membrane is not erythematous or retracted.     Nose: Nasal tenderness and congestion present.     Right Sinus: Maxillary sinus tenderness present. No frontal sinus tenderness.     Left Sinus: Maxillary sinus tenderness present. No frontal sinus tenderness.     Mouth/Throat:     Mouth: No oral lesions.     Pharynx: Uvula midline. Posterior oropharyngeal erythema present. No oropharyngeal exudate.  Cardiovascular:     Rate and Rhythm: Normal rate and regular rhythm.     Heart sounds: Normal heart sounds.  Pulmonary:     Effort: Pulmonary effort is normal. No respiratory distress.     Breath sounds: Normal breath sounds. No  wheezing or rales.  Musculoskeletal:     Cervical back: Normal range of motion.  Lymphadenopathy:     Cervical: No cervical adenopathy.  Skin:    General: Skin is warm and dry.     Findings: No rash.  Neurological:     Mental Status: She is alert and oriented to person, place, and time.  Psychiatric:        Mood and Affect: Mood normal.        Behavior: Behavior normal.    Wt Readings from Last 3 Encounters:  12/26/20 283 lb  8 oz (128.6 kg)  12/20/20 282 lb (127.9 kg)  11/08/20 282 lb (127.9 kg)    BP 138/76   Pulse (!) 51   Temp 97.8 F (36.6 C) (Oral)   Ht _0  (1.727 m)   SpO2 96%   BMI 43.11 kg/m   Assessment and Plan: 1. Acute non-recurrent maxillary sinusitis Continue over the counter sinus spray and essential oils - amoxicillin-clavulanate (AUGMENTIN) 875-125 MG tablet; Take 1 tablet by mouth 2 (two) times daily for 10 days.  Dispense: 20 tablet; Refill: 0  2. Essential (primary) hypertension Well controlled on Bystolic   Partially dictated using Editor, commissioning. Any errors are unintentional.  Halina Maidens, MD [facility]  03/13/2021

## 2021-05-08 ENCOUNTER — Encounter: Payer: Self-pay | Admitting: Internal Medicine

## 2021-05-08 ENCOUNTER — Other Ambulatory Visit: Payer: Self-pay

## 2021-05-08 ENCOUNTER — Ambulatory Visit: Payer: BC Managed Care – PPO | Admitting: Internal Medicine

## 2021-05-08 VITALS — BP 138/72 | HR 73 | Temp 99.2°F | Ht 67.0 in | Wt 270.0 lb

## 2021-05-08 DIAGNOSIS — R197 Diarrhea, unspecified: Secondary | ICD-10-CM | POA: Diagnosis not present

## 2021-05-08 LAB — POCT URINALYSIS DIPSTICK
Bilirubin, UA: NEGATIVE
Blood, UA: NEGATIVE
Glucose, UA: NEGATIVE
Ketones, UA: NEGATIVE
Leukocytes, UA: NEGATIVE
Nitrite, UA: NEGATIVE
Protein, UA: NEGATIVE
Spec Grav, UA: 1.01 (ref 1.010–1.025)
Urobilinogen, UA: 0.2 E.U./dL
pH, UA: 6 (ref 5.0–8.0)

## 2021-05-08 NOTE — Patient Instructions (Signed)
Imodium AD 2 mg three times a day as needed

## 2021-05-08 NOTE — Progress Notes (Signed)
Date:  05/08/2021   Name:  Courtney Robinson   DOB:  1955/11/16   MRN:  [id number]   Chief Complaint: Diarrhea  Diarrhea  This is a new problem. Episode onset: X1 day. The problem occurs more than 10 times per day. The problem has been gradually worsening. The stool consistency is described as Watery. The patient states that diarrhea awakens her from sleep. Associated symptoms include bloating, chills, a fever and headaches. Pertinent negatives include no abdominal pain, coughing or vomiting. Associated symptoms comments: nausea. She has tried nothing for the symptoms.  She had a head cold last week but only congestion without fever, headache,cough.  Several other members of the family were also sick and tested negative for Covid. She did eat some leftovers yesterday which seemed to trigger the stomach upset, nausea and diarrhea. At this time the diarrhea is brown liquid without blood or mucus.She is drinking plenty of fluids but not eating much.  Lab Results  Component Value Date   NA 140 12/20/2020   K 4.4 12/20/2020   CO2 24 12/20/2020   GLUCOSE 109 (H) 12/20/2020   BUN 16 12/20/2020   CREATININE 1.00 12/20/2020   CALCIUM 9.7 12/20/2020   EGFR 63 12/20/2020   GFRNONAA 57 (L) 12/15/2019   Lab Results  Component Value Date   CHOL 215 (H) 12/20/2020   HDL 59 12/20/2020   LDLCALC 127 (H) 12/20/2020   TRIG 167 (H) 12/20/2020   CHOLHDL 3.6 12/20/2020   Lab Results  Component Value Date   TSH 2.210 12/20/2020   Lab Results  Component Value Date   HGBA1C 6.3 (H) 12/20/2020   Lab Results  Component Value Date   WBC 7.3 12/20/2020   HGB 13.5 12/20/2020   HCT 39.9 12/20/2020   MCV 88 12/20/2020   PLT 267 12/20/2020   Lab Results  Component Value Date   ALT 12 12/20/2020   AST 14 12/20/2020   ALKPHOS 87 12/20/2020   BILITOT 0.3 12/20/2020   No results found for: 25OHVITD2, 25OHVITD3, VD25OH   Review of Systems  Constitutional:  Positive for appetite change,  chills and fever.  HENT:  Negative for trouble swallowing.   Respiratory:  Negative for cough, chest tightness, shortness of breath and wheezing.   Cardiovascular:  Negative for chest pain and palpitations.  Gastrointestinal:  Positive for abdominal distention, bloating, diarrhea and nausea. Negative for abdominal pain, blood in stool and vomiting.  Neurological:  Positive for headaches.   Patient Active Problem List   Diagnosis Date Noted   HSV (herpes simplex virus) infection 12/15/2019   Rectocele 07/27/2019   Arthralgia of left temporomandibular joint 10/15/2017   Pre-diabetes 08/16/2017   Environmental and seasonal allergies 07/20/2016   Blepharospasm 07/10/2016   Left knee pain 08/12/2015   History of palpitations 08/12/2015   Essential (primary) hypertension 01/17/2015   Arthralgia of hand 01/17/2015   Adult BMI 40.0-44.9 kg/sq m ([place]) 12/30/2009   Mixed hyperlipidemia 03/25/2009    Allergies  Allergen Reactions   Hydrochlorothiazide Shortness Of Breath, Swelling and Other (See Comments)    Patient states throat swelled up, felt SOB, chest pain, and tingling in arms and shoulders.   Amlodipine    Hydrocodone    Codeine Nausea And Vomiting   Lisinopril Cough   Loratadine Palpitations   Metoprolol Other (See Comments)    bradycardia   Olmesartan Rash    Past Surgical History:  Procedure Laterality Date   CHOLECYSTECTOMY  2013   ENDOMETRIAL  BIOPSY  11/16/09   Disordered Proliferative Endometrium, negative for hyperplasia, atypia or malignancy    Social History   Tobacco Use   Smoking status: Never   Smokeless tobacco: Never  Vaping Use   Vaping Use: Never used  Substance Use Topics   Alcohol use: No    Alcohol/week: 0.0 standard drinks   Drug use: No     Medication list has been reviewed and updated.  Current Meds  Medication Sig   Ascorbic Acid (VITAMIN C) 1000 MG tablet Take 1,000 mg by mouth daily.   BYSTOLIC 5 MG tablet Take 1 tablet (5 mg total)  by mouth daily.   D-Mannose POWD Take by mouth.   UNABLE TO FIND A-F Betafood   valACYclovir (VALTREX) 1000 MG tablet Take 1 tablet (1,000 mg total) by mouth 2 (two) times daily. 2 tabs every 12 hours for 2 doses as needed    PHQ 2/9 Scores 05/08/2021 03/13/2021 12/26/2020 12/20/2020  PHQ - 2 Score 0 0 0 0  PHQ- 9 Score 0 0 - 0    GAD 7 : Generalized Anxiety Score 05/08/2021 03/13/2021 12/20/2020 11/08/2020  Nervous, Anxious, on Edge 0 0 0 0  Control/stop worrying 0 0 0 0  Worry too much - different things 0 0 0 0  Trouble relaxing 0 0 0 0  Restless 0 0 0 0  Easily annoyed or irritable 0 0 0 0  Afraid - awful might happen 0 0 0 0  Total GAD 7 Score 0 0 0 0  Anxiety Difficulty - Not difficult at all - Not difficult at all    BP Readings from Last 3 Encounters:  05/08/21 138/72  03/13/21 138/76  12/20/20 112/78    Physical Exam Constitutional:      General: She is not in acute distress.    Appearance: Normal appearance. She is not ill-appearing.  Neck:     Vascular: No carotid bruit.  Cardiovascular:     Rate and Rhythm: Normal rate and regular rhythm.     Pulses: Normal pulses.  Pulmonary:     Effort: Pulmonary effort is normal.     Breath sounds: Normal breath sounds.  Abdominal:     General: Abdomen is protuberant. Bowel sounds are increased.     Palpations: Abdomen is soft.     Tenderness: There is no right CVA tenderness, left CVA tenderness, guarding or rebound.  Musculoskeletal:     Cervical back: Normal range of motion.  Lymphadenopathy:     Cervical: No cervical adenopathy.  Neurological:     Mental Status: She is alert.    Wt Readings from Last 3 Encounters:  05/08/21 270 lb (122.5 kg)  12/26/20 283 lb 8 oz (128.6 kg)  12/20/20 282 lb (127.9 kg)    BP 138/72    Pulse 73    Temp 99.2 F (37.3 C) (Oral)    Ht 5' 7"  (1.702 m)    Wt 270 lb (122.5 kg)    SpO2 96%    BMI 42.29 kg/m   Assessment and Plan: 1. Diarrhea, unspecified type Appears well hydrated  with no distress.  Abdominal exam is benign. Suspect mild viral illness - maintain hydration, use imodium tid prn. Call or follow up if sx change or worsen - CBC with Differential/Platelet - Comprehensive metabolic panel - POCT urinalysis dipstick   Partially dictated using Dragon software. Any errors are unintentional.  Halina Maidens, MD [facility]  05/08/2021

## 2021-05-09 LAB — CBC WITH DIFFERENTIAL/PLATELET
Basophils Absolute: 0 10*3/uL (ref 0.0–0.2)
Basos: 0 %
EOS (ABSOLUTE): 0.1 10*3/uL (ref 0.0–0.4)
Eos: 1 %
Hematocrit: 42.3 % (ref 34.0–46.6)
Hemoglobin: 14 g/dL (ref 11.1–15.9)
Immature Grans (Abs): 0 10*3/uL (ref 0.0–0.1)
Immature Granulocytes: 0 %
Lymphocytes Absolute: 1 10*3/uL (ref 0.7–3.1)
Lymphs: 11 %
MCH: 30 pg (ref 26.6–33.0)
MCHC: 33.1 g/dL (ref 31.5–35.7)
MCV: 91 fL (ref 79–97)
Monocytes Absolute: 0.5 10*3/uL (ref 0.1–0.9)
Monocytes: 6 %
Neutrophils Absolute: 6.7 10*3/uL (ref 1.4–7.0)
Neutrophils: 82 %
Platelets: 220 10*3/uL (ref 150–450)
RBC: 4.66 x10E6/uL (ref 3.77–5.28)
RDW: 12.6 % (ref 11.7–15.4)
WBC: 8.3 10*3/uL (ref 3.4–10.8)

## 2021-05-09 LAB — COMPREHENSIVE METABOLIC PANEL
ALT: 15 IU/L (ref 0–32)
AST: 21 IU/L (ref 0–40)
Albumin/Globulin Ratio: 1.1 — ABNORMAL LOW (ref 1.2–2.2)
Albumin: 4.2 g/dL (ref 3.8–4.8)
Alkaline Phosphatase: 79 IU/L (ref 44–121)
BUN/Creatinine Ratio: 15 (ref 12–28)
BUN: 16 mg/dL (ref 8–27)
Bilirubin Total: 0.4 mg/dL (ref 0.0–1.2)
CO2: 22 mmol/L (ref 20–29)
Calcium: 9.3 mg/dL (ref 8.7–10.3)
Chloride: 104 mmol/L (ref 96–106)
Creatinine, Ser: 1.06 mg/dL — ABNORMAL HIGH (ref 0.57–1.00)
Globulin, Total: 3.8 g/dL (ref 1.5–4.5)
Glucose: 102 mg/dL — ABNORMAL HIGH (ref 70–99)
Potassium: 4.7 mmol/L (ref 3.5–5.2)
Sodium: 140 mmol/L (ref 134–144)
Total Protein: 8 g/dL (ref 6.0–8.5)
eGFR: 58 mL/min/{1.73_m2} — ABNORMAL LOW (ref >59)

## 2021-07-03 ENCOUNTER — Encounter: Payer: Self-pay | Admitting: Internal Medicine

## 2021-07-17 ENCOUNTER — Encounter: Payer: Self-pay | Admitting: Internal Medicine

## 2021-07-17 ENCOUNTER — Ambulatory Visit: Payer: BC Managed Care – PPO | Admitting: Internal Medicine

## 2021-07-17 VITALS — BP 126/76 | HR 73 | Ht 67.0 in | Wt 277.0 lb

## 2021-07-17 DIAGNOSIS — Z6841 Body Mass Index (BMI) 40.0 and over, adult: Secondary | ICD-10-CM | POA: Diagnosis not present

## 2021-07-17 DIAGNOSIS — I10 Essential (primary) hypertension: Secondary | ICD-10-CM | POA: Diagnosis not present

## 2021-07-17 MED ORDER — BYSTOLIC 5 MG PO TABS: 5.0000 mg | ORAL_TABLET | Freq: Every day | ORAL | 1 refills | Status: DC

## 2021-07-17 NOTE — Progress Notes (Signed)
? ? ?Date:  07/17/2021  ? ?Name:  Courtney Robinson   DOB:  [birth year]   MRN:  [id number] ? ? ?Chief Complaint: Hypertension ? ?Hypertension ?This is a chronic problem. The problem is controlled. Pertinent negatives include no chest pain, headaches, palpitations or shortness of breath. Past treatments include beta blockers. There are no compliance problems.  Hypertensive end-organ damage includes kidney disease. There is no history of CAD/MI or CVA.  ? ?Lab Results  ?Component Value Date  ? NA 140 05/08/2021  ? K 4.7 05/08/2021  ? CO2 22 05/08/2021  ? GLUCOSE 102 (H) 05/08/2021  ? BUN 16 05/08/2021  ? CREATININE 1.06 (H) 05/08/2021  ? CALCIUM 9.3 05/08/2021  ? EGFR 58 (L) 05/08/2021  ? GFRNONAA 57 (L) 12/15/2019  ? ?Lab Results  ?Component Value Date  ? CHOL 215 (H) 12/20/2020  ? HDL 59 12/20/2020  ? LDLCALC 127 (H) 12/20/2020  ? TRIG 167 (H) 12/20/2020  ? CHOLHDL 3.6 12/20/2020  ? ?Lab Results  ?Component Value Date  ? TSH 2.210 12/20/2020  ? ?Lab Results  ?Component Value Date  ? HGBA1C 6.3 (H) 12/20/2020  ? ?Lab Results  ?Component Value Date  ? WBC 8.3 05/08/2021  ? HGB 14.0 05/08/2021  ? HCT 42.3 05/08/2021  ? MCV 91 05/08/2021  ? PLT 220 05/08/2021  ? ?Lab Results  ?Component Value Date  ? ALT 15 05/08/2021  ? AST 21 05/08/2021  ? ALKPHOS 79 05/08/2021  ? BILITOT 0.4 05/08/2021  ? ?No results found for: 25OHVITD2, [place], VD25OH  ? ?Review of Systems  ?Constitutional:  Negative for fatigue and unexpected weight change.  ?HENT:  Negative for nosebleeds.   ?Eyes:  Negative for visual disturbance.  ?Respiratory:  Negative for cough, chest tightness, shortness of breath and wheezing.   ?Cardiovascular:  Negative for chest pain, palpitations and leg swelling.  ?Gastrointestinal:  Negative for abdominal pain, constipation and diarrhea.  ?Neurological:  Negative for dizziness, weakness, light-headedness and headaches.  ? ?Patient Active Problem List  ? Diagnosis Date Noted  ? HSV (herpes simplex virus)  infection 12/15/2019  ? Rectocele 07/27/2019  ? Arthralgia of left temporomandibular joint 10/15/2017  ? Pre-diabetes 08/16/2017  ? Environmental and seasonal allergies 07/20/2016  ? Blepharospasm 07/10/2016  ? Left knee pain 08/12/2015  ? History of palpitations 08/12/2015  ? Essential (primary) hypertension 01/17/2015  ? Arthralgia of hand 01/17/2015  ? Adult BMI 40.0-44.9 kg/sq m ([place]) 12/30/2009  ? Mixed hyperlipidemia 03/25/2009  ? ? ?Allergies  ?Allergen Reactions  ? Hydrochlorothiazide Shortness Of Breath, Swelling and Other (See Comments)  ?  Patient states throat swelled up, felt SOB, chest pain, and tingling in arms and shoulders.  ? Amlodipine   ? Hydrocodone   ? Codeine Nausea And Vomiting  ? Lisinopril Cough  ? Loratadine Palpitations  ? Metoprolol Other (See Comments)  ?  bradycardia  ? Olmesartan Rash  ? ? ?Past Surgical History:  ?Procedure Laterality Date  ? CHOLECYSTECTOMY  2013  ? ENDOMETRIAL BIOPSY  11/16/09  ? Disordered Proliferative Endometrium, negative for hyperplasia, atypia or malignancy  ? ? ?Social History  ? ?Tobacco Use  ? Smoking status: Never  ? Smokeless tobacco: Never  ?Vaping Use  ? Vaping Use: Never used  ?Substance Use Topics  ? Alcohol use: No  ?  Alcohol/week: 0.0 standard drinks  ? Drug use: No  ? ? ? ?Medication list has been reviewed and updated. ? ?Current Meds  ?Medication Sig  ? Ascorbic Acid (VITAMIN  C) 1000 MG tablet Take 1,000 mg by mouth daily.  ? D-Mannose POWD Take by mouth.  ? UNABLE TO FIND A-F Betafood  ? valACYclovir (VALTREX) 1000 MG tablet Take 1 tablet (1,000 mg total) by mouth 2 (two) times daily. 2 tabs every 12 hours for 2 doses as needed  ? [DISCONTINUED] BYSTOLIC 5 MG tablet Take 1 tablet (5 mg total) by mouth daily.  ? ? ? ?  07/17/2021  ?  1:44 PM 05/08/2021  ?  3:49 PM 03/13/2021  ?  4:19 PM 12/20/2020  ?  8:20 AM  ?GAD 7 : Generalized Anxiety Score  ?Nervous, Anxious, on Edge 0 0 0 0  ?Control/stop worrying 0 0 0 0  ?Worry too much - different things 0 0  0 0  ?Trouble relaxing 0 0 0 0  ?Restless 0 0 0 0  ?Easily annoyed or irritable 0 0 0 0  ?Afraid - awful might happen 0 0 0 0  ?Total GAD 7 Score 0 0 0 0  ?Anxiety Difficulty Not difficult at all  Not difficult at all   ? ? ? ?  07/17/2021  ?  1:44 PM  ?Depression screen PHQ 2/9  ?Decreased Interest 0  ?Down, Depressed, Hopeless 0  ?PHQ - 2 Score 0  ?Altered sleeping 0  ?Tired, decreased energy 0  ?Change in appetite 0  ?Feeling bad or failure about yourself  0  ?Trouble concentrating 0  ?Moving slowly or fidgety/restless 0  ?Suicidal thoughts 0  ?PHQ-9 Score 0  ?Difficult doing work/chores Not difficult at all  ? ? ?BP Readings from Last 3 Encounters:  ?07/17/21 126/76  ?05/08/21 138/72  ?03/13/21 138/76  ? ? ?Physical Exam ?Vitals and nursing note reviewed.  ?Constitutional:   ?   General: She is not in acute distress. ?   Appearance: She is well-developed.  ?HENT:  ?   Head: Normocephalic and atraumatic.  ?Cardiovascular:  ?   Rate and Rhythm: Normal rate and regular rhythm.  ?   Pulses: Normal pulses.  ?   Heart sounds: No murmur heard. ?Pulmonary:  ?   Effort: Pulmonary effort is normal. No respiratory distress.  ?   Breath sounds: No wheezing or rhonchi.  ?Musculoskeletal:     ?   General: Normal range of motion.  ?   Cervical back: Normal range of motion.  ?   Right lower leg: No edema.  ?   Left lower leg: No edema.  ?Lymphadenopathy:  ?   Cervical: No cervical adenopathy.  ?Skin: ?   General: Skin is warm and dry.  ?   Capillary Refill: Capillary refill takes less than 2 seconds.  ?   Findings: No rash.  ?Neurological:  ?   General: No focal deficit present.  ?   Mental Status: She is alert and oriented to person, place, and time.  ?Psychiatric:     ?   Mood and Affect: Mood normal.     ?   Behavior: Behavior normal.  ? ? ?Wt Readings from Last 3 Encounters:  ?07/17/21 277 lb (125.6 kg)  ?05/08/21 270 lb (122.5 kg)  ?12/26/20 283 lb 8 oz (128.6 kg)  ? ? ?BP 126/76 (BP Location: Right Arm, Cuff Size: Large)    Pulse 73   Ht 5' 7"  (1.702 m)   Wt 277 lb (125.6 kg)   SpO2 97%   BMI 43.38 kg/m?  ? ?Assessment and Plan: ?1. Essential (primary) hypertension ?Clinically stable exam with well controlled BP. ?  Tolerating medications without side effects at this time. ?Pt to continue current regimen and low sodium diet; benefits of regular exercise as able discussed. ? ?2. Adult BMI 40.0-44.9 kg/sq m [facility]) ?Continue efforts at portion control to avoid further weight gain. ? ? ?Partially dictated using Editor, commissioning. Any errors are unintentional. ? ?Halina Maidens, MD ?[facility] ?[facility] ? ?07/17/2021 ? ? ? ? ? ?

## 2021-07-25 ENCOUNTER — Telehealth: Payer: Self-pay | Admitting: Internal Medicine

## 2021-07-25 ENCOUNTER — Telehealth: Payer: Self-pay

## 2021-07-25 NOTE — Telephone Encounter (Signed)
?  Ranaya w/ [facility] called to inform you the medication  BYSTOLIC 5 MG tablet ?Was approved from 07/23/2021 to 07/25/2022. ?  ?Case [id number] ? ?KP ?

## 2021-07-25 NOTE — Telephone Encounter (Signed)
Noted  KP 

## 2021-07-25 NOTE — Telephone Encounter (Signed)
Ranaya w/ [facility] called to inform you the medication  BYSTOLIC 5 MG tablet ?Was approved from 07/23/2021 to 07/25/2022. ?  ?Case [id number] ?

## 2021-12-07 ENCOUNTER — Other Ambulatory Visit: Payer: Self-pay | Admitting: Internal Medicine

## 2021-12-07 DIAGNOSIS — I10 Essential (primary) hypertension: Secondary | ICD-10-CM

## 2021-12-07 NOTE — Telephone Encounter (Signed)
Requested Prescriptions  Pending Prescriptions Disp Refills  . BYSTOLIC 5 MG tablet [Pharmacy Med Name: BYSTOLIC 5 MG TABLET] 90 tablet 1    Sig: TAKE 1 TABLET (5 MG TOTAL) BY MOUTH DAILY.     Cardiovascular: Beta Blockers 3 Failed - 12/07/2021  2:57 AM      Failed - Cr in normal range and within 360 days    Creatinine  Date Value Ref Range Status  11/20/2011 1.10 0.60 - 1.30 mg/dL Final   Creatinine, Ser  Date Value Ref Range Status  05/08/2021 1.06 (H) 0.57 - 1.00 mg/dL Final         Passed - AST in normal range and within 360 days    AST  Date Value Ref Range Status  05/08/2021 21 0 - 40 IU/L Final   SGOT(AST)  Date Value Ref Range Status  05/28/2011 141 (H) 15 - 37 Unit/L Final         Passed - ALT in normal range and within 360 days    ALT  Date Value Ref Range Status  05/08/2021 15 0 - 32 IU/L Final   SGPT (ALT)  Date Value Ref Range Status  05/28/2011 69 U/L Final    Comment:    12-78 NOTE: NEW REFERENCE RANGE 03/02/2011          Passed - Last BP in normal range    BP Readings from Last 1 Encounters:  07/17/21 126/76         Passed - Last Heart Rate in normal range    Pulse Readings from Last 1 Encounters:  07/17/21 73         Passed - Valid encounter within last 6 months    Recent Outpatient Visits          4 months ago Essential (primary) hypertension   [facility] [facility], Jesse Sans, MD   7 months ago Diarrhea, unspecified type   [place] [facility] at [facility], Jesse Sans, MD   8 months ago Acute non-recurrent maxillary sinusitis   [place] [facility] at [facility], Jesse Sans, MD   11 months ago Annual physical exam   [facility] at [facility], Jesse Sans, MD   1 year ago Eustachian tube disorder, left   [facility] at [facility], Jesse Sans, MD      Future Appointments            In 3 months Army Melia, Jesse Sans, MD [place] [facility] at [facility]

## 2021-12-18 ENCOUNTER — Other Ambulatory Visit: Payer: Self-pay | Admitting: Internal Medicine

## 2021-12-18 DIAGNOSIS — Z1231 Encounter for screening mammogram for malignant neoplasm of breast: Secondary | ICD-10-CM

## 2021-12-22 ENCOUNTER — Encounter: Payer: BC Managed Care – PPO | Admitting: Internal Medicine

## 2022-01-08 ENCOUNTER — Ambulatory Visit
Admission: RE | Admit: 2022-01-08 | Discharge: 2022-01-08 | Disposition: A | Discharge status: Home / Self Care | Payer: BC Managed Care – PPO | Source: Ambulatory Visit | Attending: Internal Medicine | Admitting: Internal Medicine

## 2022-01-08 ENCOUNTER — Ambulatory Visit (INDEPENDENT_AMBULATORY_CARE_PROVIDER_SITE_OTHER): Payer: BC Managed Care – PPO

## 2022-01-08 DIAGNOSIS — Z1231 Encounter for screening mammogram for malignant neoplasm of breast: Secondary | ICD-10-CM | POA: Insufficient documentation

## 2022-01-08 DIAGNOSIS — Z23 Encounter for immunization: Secondary | ICD-10-CM

## 2022-01-15 ENCOUNTER — Ambulatory Visit: Payer: BC Managed Care – PPO | Admitting: Internal Medicine

## 2022-01-15 ENCOUNTER — Encounter: Payer: Self-pay | Admitting: Internal Medicine

## 2022-01-15 VITALS — BP 136/76 | HR 60 | Temp 98.1°F | Ht 67.0 in | Wt 282.0 lb

## 2022-01-15 DIAGNOSIS — U071 COVID-19: Secondary | ICD-10-CM | POA: Diagnosis not present

## 2022-01-15 DIAGNOSIS — J0110 Acute frontal sinusitis, unspecified: Secondary | ICD-10-CM

## 2022-01-15 LAB — POC COVID19 BINAXNOW: SARS Coronavirus 2 Ag: POSITIVE — AB

## 2022-01-15 MED ORDER — MOLNUPIRAVIR EUA 200MG CAPSULE: 4.0000 | ORAL_CAPSULE | Freq: Two times a day (BID) | ORAL | 0 refills | Status: AC

## 2022-01-15 MED ORDER — PROMETHAZINE-DM 6.25-15 MG/5ML PO SYRP: 5.0000 mL | ORAL_SOLUTION | Freq: Four times a day (QID) | ORAL | 0 refills | Status: DC | PRN

## 2022-01-15 MED ORDER — AZITHROMYCIN 250 MG PO TABS: ORAL_TABLET | ORAL | 0 refills | Status: AC

## 2022-01-15 NOTE — Progress Notes (Signed)
Date:  01/15/2022   Name:  Courtney Robinson   DOB:  03/02/1956   MRN:  [id number]   Chief Complaint: Fever  Fever  This is a new problem. Episode onset: 4 days. The problem has been waxing and waning. The maximum temperature noted was 100 to 100.9 F. The temperature was taken using an oral thermometer. Associated symptoms include congestion, coughing, headaches, muscle aches and a sore throat. Pertinent negatives include no diarrhea, nausea, vomiting or wheezing.  Sinus Problem This is a new problem. The current episode started yesterday. The problem has been gradually worsening since onset. The maximum temperature recorded prior to her arrival was 100.4 - 100.9 F. Associated symptoms include chills, congestion, coughing, headaches, sinus pressure and a sore throat. Past treatments include saline nose sprays. The treatment provided no relief.    Lab Results  Component Value Date   NA 140 05/08/2021   K 4.7 05/08/2021   CO2 22 05/08/2021   GLUCOSE 102 (H) 05/08/2021   BUN 16 05/08/2021   CREATININE 1.06 (H) 05/08/2021   CALCIUM 9.3 05/08/2021   EGFR 58 (L) 05/08/2021   GFRNONAA 57 (L) 12/15/2019   Lab Results  Component Value Date   CHOL 215 (H) 12/20/2020   HDL 59 12/20/2020   LDLCALC 127 (H) 12/20/2020   TRIG 167 (H) 12/20/2020   CHOLHDL 3.6 12/20/2020   Lab Results  Component Value Date   TSH 2.210 12/20/2020   Lab Results  Component Value Date   HGBA1C 6.3 (H) 12/20/2020   Lab Results  Component Value Date   WBC 8.3 05/08/2021   HGB 14.0 05/08/2021   HCT 42.3 05/08/2021   MCV 91 05/08/2021   PLT 220 05/08/2021   Lab Results  Component Value Date   ALT 15 05/08/2021   AST 21 05/08/2021   ALKPHOS 79 05/08/2021   BILITOT 0.4 05/08/2021   No results found for: "25OHVITD2", "25OHVITD3", "VD25OH"   Review of Systems  Constitutional:  Positive for chills and fever.  HENT:  Positive for congestion, sinus pressure, sinus pain and sore throat.    Respiratory:  Positive for cough. Negative for chest tightness and wheezing.   Gastrointestinal:  Negative for diarrhea, nausea and vomiting.  Musculoskeletal:  Positive for myalgias.  Neurological:  Positive for headaches.  Psychiatric/Behavioral:  Positive for sleep disturbance. Negative for dysphoric mood. The patient is not nervous/anxious.     Patient Active Problem List   Diagnosis Date Noted   HSV (herpes simplex virus) infection 12/15/2019   Rectocele 07/27/2019   Arthralgia of left temporomandibular joint 10/15/2017   Pre-diabetes 08/16/2017   Environmental and seasonal allergies 07/20/2016   Blepharospasm 07/10/2016   Left knee pain 08/12/2015   History of palpitations 08/12/2015   Essential (primary) hypertension 01/17/2015   Arthralgia of hand 01/17/2015   Adult BMI 40.0-44.9 kg/sq m ([place]) 12/30/2009   Mixed hyperlipidemia 03/25/2009    Allergies  Allergen Reactions   Hydrochlorothiazide Shortness Of Breath, Swelling and Other (See Comments)    Patient states throat swelled up, felt SOB, chest pain, and tingling in arms and shoulders.   Amlodipine    Hydrocodone    Codeine Nausea And Vomiting   Lisinopril Cough   Loratadine Palpitations   Metoprolol Other (See Comments)    bradycardia   Olmesartan Rash    Past Surgical History:  Procedure Laterality Date   CHOLECYSTECTOMY  2013   ENDOMETRIAL BIOPSY  11/16/09   Disordered Proliferative Endometrium, negative for hyperplasia, atypia  or malignancy    Social History   Tobacco Use   Smoking status: Never   Smokeless tobacco: Never  Vaping Use   Vaping Use: Never used  Substance Use Topics   Alcohol use: No    Alcohol/week: 0.0 standard drinks of alcohol   Drug use: No     Medication list has been reviewed and updated.  No outpatient medications have been marked as taking for the 01/15/22 encounter (Office Visit) with Glean Hess, MD.       07/17/2021    1:44 PM 05/08/2021    3:49 PM  03/13/2021    4:19 PM 12/20/2020    8:20 AM  GAD 7 : Generalized Anxiety Score  Nervous, Anxious, on Edge 0 0 0 0  Control/stop worrying 0 0 0 0  Worry too much - different things 0 0 0 0  Trouble relaxing 0 0 0 0  Restless 0 0 0 0  Easily annoyed or irritable 0 0 0 0  Afraid - awful might happen 0 0 0 0  Total GAD 7 Score 0 0 0 0  Anxiety Difficulty Not difficult at all  Not difficult at all        07/17/2021    1:44 PM 05/08/2021    3:49 PM 03/13/2021    4:19 PM  Depression screen PHQ 2/9  Decreased Interest 0 0 0  Down, Depressed, Hopeless 0 0 0  PHQ - 2 Score 0 0 0  Altered sleeping 0 0 0  Tired, decreased energy 0 0 0  Change in appetite 0 0 0  Feeling bad or failure about yourself  0 0 0  Trouble concentrating 0 0 0  Moving slowly or fidgety/restless 0 0 0  Suicidal thoughts 0 0 0  PHQ-9 Score 0 0 0  Difficult doing work/chores Not difficult at all Not difficult at all Not difficult at all    BP Readings from Last 3 Encounters:  01/15/22 (!) 150/96  07/17/21 126/76  05/08/21 138/72    Physical Exam Vitals and nursing note reviewed.  Constitutional:      General: She is not in acute distress.    Appearance: Normal appearance. She is well-developed.  HENT:     Head: Normocephalic and atraumatic.     Nose: Congestion present.     Right Sinus: Maxillary sinus tenderness and frontal sinus tenderness present.     Left Sinus: Maxillary sinus tenderness and frontal sinus tenderness present.  Cardiovascular:     Rate and Rhythm: Normal rate and regular rhythm.  Pulmonary:     Effort: Pulmonary effort is normal. No respiratory distress.     Breath sounds: No wheezing or rhonchi.  Musculoskeletal:     Cervical back: Normal range of motion.  Lymphadenopathy:     Cervical: No cervical adenopathy.  Skin:    General: Skin is warm and dry.     Findings: No rash.  Neurological:     Mental Status: She is alert and oriented to person, place, and time.  Psychiatric:         Mood and Affect: Mood normal.        Behavior: Behavior normal.     Wt Readings from Last 3 Encounters:  01/15/22 282 lb (127.9 kg)  07/17/21 277 lb (125.6 kg)  05/08/21 270 lb (122.5 kg)    BP (!) 150/96   Pulse 60   Temp 98.1 F (36.7 C) (Oral)   Ht 5' 7"  (1.702 m)   Wt  282 lb (127.9 kg)   SpO2 96%   BMI 44.17 kg/m   Assessment and Plan: 1. COVID-19 virus infection Take Tylenol 650 - 1000 mg every 6-8 hours for fever, body aches and headache. Drink plenty of fluids with electrolytes. Monitor for fever that does not decrease and/or shortness of breath that worsens or is present at rest. Quarantine for 5 days. - promethazine-dextromethorphan (PROMETHAZINE-DM) 6.25-15 MG/5ML syrup; Take 5 mLs by mouth 4 (four) times daily as needed for up to 9 days for cough.  Dispense: 118 mL; Refill: 0 - molnupiravir EUA (LAGEVRIO) 200 mg CAPS capsule; Take 4 capsules (800 mg total) by mouth 2 (two) times daily for 5 days.  Dispense: 40 capsule; Refill: 0  2. Acute non-recurrent frontal sinusitis - azithromycin (ZITHROMAX Z-PAK) 250 MG tablet; UAD  Dispense: 6 each; Refill: 0   Partially dictated using Editor, commissioning. Any errors are unintentional.  Halina Maidens, MD [facility]  01/15/2022

## 2022-01-15 NOTE — Patient Instructions (Signed)
Take Tylenol 650 - 1000 mg every 6-8 hours for fever, body aches and headache. Drink plenty of fluids with electrolytes. Monitor for fever that does not decrease and/or shortness of breath that worsens or is present at rest. Quarantine for 5 days.  

## 2022-01-23 ENCOUNTER — Ambulatory Visit: Payer: BC Managed Care – PPO | Admitting: Internal Medicine

## 2022-01-23 ENCOUNTER — Encounter: Payer: Self-pay | Admitting: Internal Medicine

## 2022-01-23 VITALS — BP 128/76 | HR 64 | Temp 97.5°F | Ht 67.0 in | Wt 283.0 lb

## 2022-01-23 DIAGNOSIS — R052 Subacute cough: Secondary | ICD-10-CM

## 2022-01-23 MED ORDER — PROMETHAZINE-DM 6.25-15 MG/5ML PO SYRP: 5.0000 mL | ORAL_SOLUTION | Freq: Four times a day (QID) | ORAL | 0 refills | Status: DC | PRN

## 2022-01-23 MED ORDER — ALBUTEROL SULFATE HFA 108 (90 BASE) MCG/ACT IN AERS: 2.0000 | INHALATION_SPRAY | Freq: Four times a day (QID) | RESPIRATORY_TRACT | 0 refills | Status: DC | PRN

## 2022-01-23 NOTE — Progress Notes (Signed)
Date:  01/23/2022   Name:  Courtney Robinson   DOB:  17-Dec-1955   MRN:  [id number]   Chief Complaint: Cough  Cough This is a new problem. The current episode started in the past 7 days. Associated symptoms include wheezing. Pertinent negatives include no chest pain, chills, fever, headaches or shortness of breath.  She is 8 days out from Covid-19 diagnosis.  She feels much better, no shortness of breath or fever.  She has noticed some mild wheezing at night when lying down and became concerned.   Lab Results  Component Value Date   NA 140 05/08/2021   K 4.7 05/08/2021   CO2 22 05/08/2021   GLUCOSE 102 (H) 05/08/2021   BUN 16 05/08/2021   CREATININE 1.06 (H) 05/08/2021   CALCIUM 9.3 05/08/2021   EGFR 58 (L) 05/08/2021   GFRNONAA 57 (L) 12/15/2019   Lab Results  Component Value Date   CHOL 215 (H) 12/20/2020   HDL 59 12/20/2020   LDLCALC 127 (H) 12/20/2020   TRIG 167 (H) 12/20/2020   CHOLHDL 3.6 12/20/2020   Lab Results  Component Value Date   TSH 2.210 12/20/2020   Lab Results  Component Value Date   HGBA1C 6.3 (H) 12/20/2020   Lab Results  Component Value Date   WBC 8.3 05/08/2021   HGB 14.0 05/08/2021   HCT 42.3 05/08/2021   MCV 91 05/08/2021   PLT 220 05/08/2021   Lab Results  Component Value Date   ALT 15 05/08/2021   AST 21 05/08/2021   ALKPHOS 79 05/08/2021   BILITOT 0.4 05/08/2021   No results found for: "25OHVITD2", "25OHVITD3", "VD25OH"   Review of Systems  Constitutional:  Negative for chills, fatigue and fever.  HENT:  Negative for sinus pressure and trouble swallowing.        Taste is decreased  Respiratory:  Positive for cough and wheezing. Negative for chest tightness and shortness of breath.   Cardiovascular:  Negative for chest pain.  Neurological:  Negative for dizziness and headaches.  Psychiatric/Behavioral:  Negative for dysphoric mood and sleep disturbance. The patient is not nervous/anxious.     Patient Active Problem  List   Diagnosis Date Noted  . HSV (herpes simplex virus) infection 12/15/2019  . Rectocele 07/27/2019  . Arthralgia of left temporomandibular joint 10/15/2017  . Pre-diabetes 08/16/2017  . Environmental and seasonal allergies 07/20/2016  . 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 Left knee pain 08/12/2015  . History of palpitations 08/12/2015  . Essential (primary) hypertension 01/17/2015  . Arthralgia of hand 01/17/2015  . Adult BMI 40.0-44.9 kg/sq m ([place]) 12/30/2009  . Mixed hyperlipidemia 03/25/2009    Allergies  Allergen Reactions  . Hydrochlorothiazide Shortness Of Breath, Swelling and Other (See Comments)    Patient states throat swelled up, felt SOB, chest pain, and tingling in arms and shoulders.  . Amlodipine   . Hydrocodone   . Codeine Nausea And Vomiting  . Lisinopril Cough  . Loratadine Palpitations  . Metoprolol Other (See Comments)    bradycardia  . Olmesartan Rash    Past Surgical History:  Procedure Laterality Date  . CHOLECYSTECTOMY  2013  . 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 Smoking status: Never  . Smokeless tobacco: Never  Vaping Use  . Vaping Use: Never used  Substance Use Topics  . Alcohol use: No    Alcohol/week: 0.0  standard drinks of alcohol  . Drug use: No     Medication list has been reviewed and updated.  Current Meds  Medication Sig  . Ascorbic Acid (VITAMIN C) 1000 MG tablet Take 1,000 mg by mouth daily.  Marland Kitchen BYSTOLIC 5 MG tablet TAKE 1 TABLET (5 MG TOTAL) BY MOUTH DAILY.  Marland Kitchen D-Mannose POWD Take by mouth.  Marland Kitchen UNABLE TO FIND A-F Betafood  . valACYclovir (VALTREX) 1000 MG tablet Take 1 tablet (1,000 mg total) by mouth 2 (two) times daily. 2 tabs every 12 hours for 2 doses as needed       01/23/2022   11:10 AM 07/17/2021    1:44 PM 05/08/2021    3:49 PM 03/13/2021    4:19 PM  GAD 7 : Generalized Anxiety Score  Nervous, Anxious,  on Edge 0 0 0 0  Control/stop worrying 0 0 0 0  Worry too much - different things 0 0 0 0  Trouble relaxing 0 0 0 0  Restless 0 0 0 0  Easily annoyed or irritable 0 0 0 0  Afraid - awful might happen 0 0 0 0  Total GAD 7 Score 0 0 0 0  Anxiety Difficulty Not difficult at all Not difficult at all  Not difficult at all       01/23/2022   11:10 AM 07/17/2021    1:44 PM 05/08/2021    3:49 PM  Depression screen PHQ 2/9  Decreased Interest 0 0 0  Down, Depressed, Hopeless 0 0 0  PHQ - 2 Score 0 0 0  Altered sleeping 0 0 0  Tired, decreased energy 0 0 0  Change in appetite 0 0 0  Feeling bad or failure about yourself  0 0 0  Trouble concentrating 0 0 0  Moving slowly or fidgety/restless 0 0 0  Suicidal thoughts 0 0 0  PHQ-9 Score 0 0 0  Difficult doing work/chores Not difficult at all Not difficult at all Not difficult at all    BP Readings from Last 3 Encounters:  01/23/22 128/76  01/15/22 136/76  07/17/21 126/76    Physical Exam Vitals and nursing note reviewed.  Constitutional:      General: She is not in acute distress.    Appearance: Normal appearance. She is well-developed.  HENT:     Head: Normocephalic and atraumatic.     Right Ear: Tympanic membrane normal.     Left Ear: Tympanic membrane normal.  Cardiovascular:     Rate and Rhythm: Normal rate and regular rhythm.  Pulmonary:     Effort: Pulmonary effort is normal. No respiratory distress.     Breath sounds: Normal breath sounds. No wheezing or rhonchi.  Musculoskeletal:     Cervical back: Normal range of motion.  Lymphadenopathy:     Cervical: No cervical adenopathy.  Skin:    General: Skin is warm and dry.     Findings: No rash.  Neurological:     Mental Status: She is alert and oriented to person, place, and time.  Psychiatric:        Mood and Affect: Mood normal.        Behavior: Behavior normal.    Wt Readings from Last 3 Encounters:  01/23/22 283 lb (128.4 kg)  01/15/22 282 lb (127.9 kg)   07/17/21 277 lb (125.6 kg)    BP 128/76   Pulse 64   Temp (!) 97.5 F (36.4 C) (Oral)   Ht 5' 7"  (1.702 m)   Wt  283 lb (128.4 kg)   SpO2 94%   BMI 44.32 kg/m   Assessment and Plan: 1. Subacute cough Recovering well from Covid. No worrisome findings or symptoms  Mild wheezing is from PND - continue to use cough syrup as needed Saline NS for dry nares Albuterol MDI if needed for persistent wheezing. - promethazine-dextromethorphan (PROMETHAZINE-DM) 6.25-15 MG/5ML syrup; Take 5 mLs by mouth 4 (four) times daily as needed for up to 9 days for cough.  Dispense: 118 mL; Refill: 0 - albuterol (VENTOLIN HFA) 108 (90 Base) MCG/ACT inhaler; Inhale 2 puffs into the lungs every 6 (six) hours as needed for wheezing or shortness of breath.  Dispense: 1 each; Refill: 0   Partially dictated using Editor, commissioning. Any errors are unintentional.  Courtney Maidens, MD [facility]  01/23/2022

## 2022-02-08 ENCOUNTER — Ambulatory Visit: Payer: BC Managed Care – PPO

## 2022-02-08 ENCOUNTER — Ambulatory Visit: Payer: Self-pay

## 2022-02-08 ENCOUNTER — Ambulatory Visit: Payer: BC Managed Care – PPO | Admitting: Family Medicine

## 2022-02-08 ENCOUNTER — Encounter: Payer: Self-pay | Admitting: Family Medicine

## 2022-02-08 DIAGNOSIS — B9689 Other specified bacterial agents as the cause of diseases classified elsewhere: Secondary | ICD-10-CM | POA: Diagnosis not present

## 2022-02-08 DIAGNOSIS — J019 Acute sinusitis, unspecified: Secondary | ICD-10-CM

## 2022-02-08 HISTORY — DX: Other specified bacterial agents as the cause of diseases classified elsewhere: B96.89

## 2022-02-08 MED ORDER — PROMETHAZINE-DM 6.25-15 MG/5ML PO SYRP: 5.0000 mL | ORAL_SOLUTION | Freq: Four times a day (QID) | ORAL | 0 refills | Status: DC | PRN

## 2022-02-08 MED ORDER — AMOXICILLIN-POT CLAVULANATE 875-125 MG PO TABS: 1.0000 | ORAL_TABLET | Freq: Two times a day (BID) | ORAL | 0 refills | Status: DC

## 2022-02-08 NOTE — Assessment & Plan Note (Addendum)
Patient presents with 1 day history of fevers, left greater than right facial pain, copious yellow secretions from nose, sore throat, and productive cough.  Denies any shortness of air.  Of note, was treated for COVID 3 weeks prior with antivirals, additionally azithromycin.  Did feel that she had improved and symptoms were at baseline prior to this episode.  Examination reveals benign cardiac sounds, clear lung fields throughout without wheezes, rales, rhonchi.  No cervical lymphadenopathy, tympanic membranes and canals benign bilaterally, there is tenderness throughout the frontal, maxillary, and ethmoid sinuses, she has swollen and erythematous turbinates, left greater than right, visualized oropharynx benign without exudates or significant erythema.  Clinical history and findings are most consistent with acute bacterial rhinosinusitis, given recent medications, will utilize Augmentin x10 days, prescribe promethazine-dextromethorphan, and advised patient on OTC supportive care.  Did offer status update via MyChart in 1 week versus in person visit, patient's preference is to return to office, 1 week follow-up scheduled with her PCP Dr. Halina Maidens.  Recalcitrant symptoms can be addressed with escalation of antibiotics as clinically guided.  Office visit notes from 01/15/2022 and 01/23/2022 reviewed, COVID-positive test from 01/15/2022 reviewed today.

## 2022-02-08 NOTE — Progress Notes (Signed)
     Primary Care / Sports Medicine Office Visit  Patient Information:  Patient ID: Courtney Robinson, female DOB: May 20, 1955 Age: 66 y.o. MRN: [id number]   Courtney Mar Navdeep Fessenden is a pleasant 66 y.o. female presenting with the following:  Chief Complaint  Patient presents with   Sore Throat    fever, sore throat, facial pain, yellow secretions from nose and cough. Had Covid 3 weeks ago, was on antivirals and Zpack, got better, started feeling worst yesterday    Vitals:   02/08/22 0906  BP: (!) 140/92  Pulse: 80  Temp: 99.1 F (37.3 C)  SpO2: 98%   Vitals:   02/08/22 0906  Weight: 295 lb (133.8 kg)  Height: '5\' 7"'$  (1.702 m)   Body mass index is 46.2 kg/m.  No results found.   Independent interpretation of notes and tests performed by another provider:   None  Procedures performed:   None  Pertinent History, Exam, Impression, and Recommendations:   Problem List Items Addressed This Visit       Respiratory   Acute bacterial rhinosinusitis    Patient presents with 1 day history of fevers, left greater than right facial pain, copious yellow secretions from nose, sore throat, and productive cough.  Denies any shortness of air.  Of note, was treated for COVID 3 weeks prior with antivirals, additionally azithromycin.  Did feel that she had improved and symptoms were at baseline prior to this episode.  Examination reveals benign cardiac sounds, clear lung fields throughout without wheezes, rales, rhonchi.  No cervical lymphadenopathy, tympanic membranes and canals benign bilaterally, there is tenderness throughout the frontal, maxillary, and ethmoid sinuses, she has swollen and erythematous turbinates, left greater than right, visualized oropharynx benign without exudates or significant erythema.  Clinical history and findings are most consistent with acute bacterial rhinosinusitis, given recent medications, will utilize Augmentin x10 days, prescribe  promethazine-dextromethorphan, and advised patient on OTC supportive care.  Did offer status update via MyChart in 1 week versus in person visit, patient's preference is to return to office, 1 week follow-up scheduled with her PCP Dr. Halina Maidens.  Recalcitrant symptoms can be addressed with escalation of antibiotics as clinically guided.  Office visit notes from 01/15/2022 and 01/23/2022 reviewed, COVID-positive test from 01/15/2022 reviewed today.      Relevant Medications   amoxicillin-clavulanate (AUGMENTIN) 875-125 MG tablet   promethazine-dextromethorphan (PROMETHAZINE-DM) 6.25-15 MG/5ML syrup     Orders & Medications Meds ordered this encounter  Medications   amoxicillin-clavulanate (AUGMENTIN) 875-125 MG tablet    Sig: Take 1 tablet by mouth 2 (two) times daily.    Dispense:  20 tablet    Refill:  0   promethazine-dextromethorphan (PROMETHAZINE-DM) 6.25-15 MG/5ML syrup    Sig: Take 5 mLs by mouth 4 (four) times daily as needed for up to 9 days for cough.    Dispense:  118 mL    Refill:  0   No orders of the defined types were placed in this encounter.    Return in about 1 week (around 02/15/2022) for With Dr. Army Melia for f/u sinusitis.     Montel Culver, MD   Primary Care Sports Medicine [place]

## 2022-02-08 NOTE — Patient Instructions (Addendum)
-   Dose Augmentin x10 days - Use Flonase (fluticasone propionate), 2 sprays in each nostril daily x10 days - Use Mucinex (guaifenesin) twice daily x10 days - Consider regular scheduled dosing of Tylenol (acetaminophen) for sinus pain and/or fever - Can use promethazine-DM as needed for cough - Consider vitamin D and C supplementation - Maintain follow-up with Dr. Army Melia in 1 week, contact for any questions/concerns between now and then

## 2022-02-08 NOTE — Telephone Encounter (Signed)
Message from Scherrie Gerlach sent at 02/08/2022  8:06 AM EDT  Summary: poss URI   pt had covid about 3 weeks ago.  Today her sinuses are inflamed, a lot of mucus, had a fever last night. Head stopped up, lost her voice, throat inflamed.  Was hoping to see Dr Carolin Coy.  No appt's please advise, as pt wanted to know something ASAP         Chief Complaint: sore throat, facial pain, fever, cough, nasal congestion, hoarse- pt stated all sx equally bother her Symptoms: coughing yellow phlegm and nasal secretions yellow, swollen lymph nodes Frequency: few days worse today Pertinent Negatives: Patient denies SOB, headache Disposition: '[]'$ ED /'[]'$ Urgent Care (no appt availability in office) / '[x]'$ Appointment(In office/virtual)/ '[]'$  [place]/ '[]'$ Home Care/ '[]'$ Refused Recommended Disposition /'[]'$ [facility]/ '[]'$  Follow-up with PCP Additional Notes: no appt's available with PCP. Appt made with Dr Zigmund Daniel this am.  Reason for Disposition  SEVERE (e.g., excruciating) throat pain    Moderate to severe throat pain  Answer Assessment - Initial Assessment Questions 1. COVID-19 DIAGNOSIS: "How do you know that you have COVID?" (e.g., positive lab test or self-test, diagnosed by doctor or NP/PA, symptoms after exposure).     *No Answer* 2. COVID-19 EXPOSURE: "Was there any known exposure to COVID before the symptoms began?" CDC Definition of close contact: within 6 feet (2 meters) for a total of 15 minutes or more over a 24-hour period.      *No Answer* 3. ONSET: "When did the COVID-19 symptoms start?"      *No Answer* 4. WORST SYMPTOM: "What is your worst symptom?" (e.g., cough, fever, shortness of breath, muscle aches)     *No Answer* 5. COUGH: "Do you have a cough?" If Yes, ask: "How bad is the cough?"       Yes - yellow and green 6. FEVER: "Do you have a fever?" If Yes, ask: "What is your temperature, how was it measured, and when did it start?"     *No Answer* 7. RESPIRATORY  STATUS: "Describe your breathing?" (e.g., normal; shortness of breath, wheezing, unable to speak)      *No Answer* 8. BETTER-SAME-WORSE: "Are you getting better, staying the same or getting worse compared to yesterday?"  If getting worse, ask, "In what way?"     *No Answer* 9. OTHER SYMPTOMS: "Do you have any other symptoms?"  (e.g., chills, fatigue, headache, loss of smell or taste, muscle pain, sore throat)     Face hurts, sore throat, swollen lymph nodes, 100.7, fatigue, nasal discharge (yellow thick mucus) 10. HIGH RISK DISEASE: "Do you have any chronic medical problems?" (e.g., asthma, heart or lung disease, weak immune system, obesity, etc.)       HTN 11. VACCINE: "Have you had the COVID-19 vaccine?" If Yes, ask: "Which one, how many shots, when did you get it?"       *No Answer* 12. PREGNANCY: "Is there any chance you are pregnant?" "When was your last menstrual period?"       *No Answer* 13. O2 SATURATION MONITOR:  "Do you use an oxygen saturation monitor (pulse oximeter) at home?" If Yes, ask "What is your reading (oxygen level) today?" "What is your usual oxygen saturation reading?" (e.g., 95%)       *No Answer*  Answer Assessment - Initial Assessment Questions 1. ONSET: "When did the throat start hurting?" (Hours or days ago)      Few days ago 2. SEVERITY: "How bad is the  sore throat?" (Scale 1-10; mild, moderate or severe)   - MILD (1-3):  Doesn't interfere with eating or normal activities.   - MODERATE (4-7): Interferes with eating some solids and normal activities.   - SEVERE (8-10):  Excruciating pain, interferes with most normal activities.   - SEVERE WITH DYSPHAGIA (10): Can't swallow liquids, drooling.     Moderate to severe 3. STREP EXPOSURE: "Has there been any exposure to strep within the past week?" If Yes, ask: "What type of contact occurred?"      N/a 4.  VIRAL SYMPTOMS: "Are there any symptoms of a cold, such as a runny nose, cough, hoarse voice or red eyes?"       Cough, hoarse, sinus and facial pain, fever,swollen lymph nodes 5. FEVER: "Do you have a fever?" If Yes, ask: "What is your temperature, how was it measured, and when did it start?"     Yes- 100.2 6. PUS ON THE TONSILS: "Is there pus on the tonsils in the back of your throat?"     N/a 7. OTHER SYMPTOMS: "Do you have any other symptoms?" (e.g., difficulty breathing, headache, rash)     Facial pain, sore throat See above 8. PREGNANCY: "Is there any chance you are pregnant?" "When was your last menstrual period?"     N/a  Protocols used: Coronavirus (COVID-19) Diagnosed or Suspected-A-AH, Sore Throat-A-AH

## 2022-02-12 ENCOUNTER — Encounter: Payer: Self-pay | Admitting: Internal Medicine

## 2022-02-12 ENCOUNTER — Other Ambulatory Visit: Payer: Self-pay | Admitting: Family Medicine

## 2022-02-12 ENCOUNTER — Ambulatory Visit: Payer: BC Managed Care – PPO | Admitting: Internal Medicine

## 2022-02-12 VITALS — BP 132/78 | HR 67 | Temp 98.0°F | Ht 67.0 in | Wt 285.0 lb

## 2022-02-12 DIAGNOSIS — J019 Acute sinusitis, unspecified: Secondary | ICD-10-CM | POA: Diagnosis not present

## 2022-02-12 DIAGNOSIS — B9689 Other specified bacterial agents as the cause of diseases classified elsewhere: Secondary | ICD-10-CM

## 2022-02-12 DIAGNOSIS — J358 Other chronic diseases of tonsils and adenoids: Secondary | ICD-10-CM | POA: Diagnosis not present

## 2022-02-12 MED ORDER — PROMETHAZINE-DM 6.25-15 MG/5ML PO SYRP: 5.0000 mL | ORAL_SOLUTION | Freq: Four times a day (QID) | ORAL | 0 refills | Status: AC | PRN

## 2022-02-12 NOTE — Progress Notes (Signed)
Date:  02/12/2022   Name:  Courtney Robinson   DOB:  25-Jan-1956   MRN:  [id number]   Chief Complaint: Cough  Cough This is a recurrent problem. The current episode started 1 to 4 weeks ago. The problem has been waxing and waning. The cough is Productive of sputum. Associated symptoms include postnasal drip and a sore throat. Pertinent negatives include no chest pain, chills, fever, headaches, shortness of breath or wheezing.  Sinusitis Associated symptoms include coughing and a sore throat. Pertinent negatives include no chills, headaches or shortness of breath.    Lab Results  Component Value Date   NA 140 05/08/2021   K 4.7 05/08/2021   CO2 22 05/08/2021   GLUCOSE 102 (H) 05/08/2021   BUN 16 05/08/2021   CREATININE 1.06 (H) 05/08/2021   CALCIUM 9.3 05/08/2021   EGFR 58 (L) 05/08/2021   GFRNONAA 57 (L) 12/15/2019   Lab Results  Component Value Date   CHOL 215 (H) 12/20/2020   HDL 59 12/20/2020   LDLCALC 127 (H) 12/20/2020   TRIG 167 (H) 12/20/2020   CHOLHDL 3.6 12/20/2020   Lab Results  Component Value Date   TSH 2.210 12/20/2020   Lab Results  Component Value Date   HGBA1C 6.3 (H) 12/20/2020   Lab Results  Component Value Date   WBC 8.3 05/08/2021   HGB 14.0 05/08/2021   HCT 42.3 05/08/2021   MCV 91 05/08/2021   PLT 220 05/08/2021   Lab Results  Component Value Date   ALT 15 05/08/2021   AST 21 05/08/2021   ALKPHOS 79 05/08/2021   BILITOT 0.4 05/08/2021   No results found for: "25OHVITD2", "25OHVITD3", "VD25OH"   Review of Systems  Constitutional:  Negative for chills, fatigue and fever.  HENT:  Positive for postnasal drip and sore throat.   Respiratory:  Positive for cough. Negative for chest tightness, shortness of breath and wheezing.   Cardiovascular:  Negative for chest pain and palpitations.  Neurological:  Negative for dizziness, light-headedness and headaches.    Patient Active Problem List   Diagnosis Date Noted   Acute  bacterial rhinosinusitis 02/08/2022   HSV (herpes simplex virus) infection 12/15/2019   Rectocele 07/27/2019   Arthralgia of left temporomandibular joint 10/15/2017   Pre-diabetes 08/16/2017   Environmental and seasonal allergies 07/20/2016   Blepharospasm 07/10/2016   Left knee pain 08/12/2015   History of palpitations 08/12/2015   Essential (primary) hypertension 01/17/2015   Arthralgia of hand 01/17/2015   Adult BMI 40.0-44.9 kg/sq m ([place]) 12/30/2009   Mixed hyperlipidemia 03/25/2009    Allergies  Allergen Reactions   Hydrochlorothiazide Shortness Of Breath, Swelling and Other (See Comments)    Patient states throat swelled up, felt SOB, chest pain, and tingling in arms and shoulders.   Amlodipine    Hydrocodone    Codeine Nausea And Vomiting   Lisinopril Cough   Loratadine Palpitations   Metoprolol Other (See Comments)    bradycardia   Olmesartan Rash    Past Surgical History:  Procedure Laterality Date   CHOLECYSTECTOMY  2013   ENDOMETRIAL BIOPSY  11/16/09   Disordered Proliferative Endometrium, negative for hyperplasia, atypia or malignancy    Social History   Tobacco Use   Smoking status: Never   Smokeless tobacco: Never  Vaping Use   Vaping Use: Never used  Substance Use Topics   Alcohol use: No    Alcohol/week: 0.0 standard drinks of alcohol   Drug use: No  Medication list has been reviewed and updated.  Current Meds  Medication Sig   albuterol (VENTOLIN HFA) 108 (90 Base) MCG/ACT inhaler Inhale 2 puffs into the lungs every 6 (six) hours as needed for wheezing or shortness of breath.   amoxicillin-clavulanate (AUGMENTIN) 875-125 MG tablet Take 1 tablet by mouth 2 (two) times daily.   Ascorbic Acid (VITAMIN C) 1000 MG tablet Take 1,000 mg by mouth daily.   BYSTOLIC 5 MG tablet TAKE 1 TABLET (5 MG TOTAL) BY MOUTH DAILY.   D-Mannose POWD Take by mouth.   UNABLE TO FIND A-F Betafood   valACYclovir (VALTREX) 1000 MG tablet Take 1 tablet (1,000 mg  total) by mouth 2 (two) times daily. 2 tabs every 12 hours for 2 doses as needed       01/23/2022   11:10 AM 07/17/2021    1:44 PM 05/08/2021    3:49 PM 03/13/2021    4:19 PM  GAD 7 : Generalized Anxiety Score  Nervous, Anxious, on Edge 0 0 0 0  Control/stop worrying 0 0 0 0  Worry too much - different things 0 0 0 0  Trouble relaxing 0 0 0 0  Restless 0 0 0 0  Easily annoyed or irritable 0 0 0 0  Afraid - awful might happen 0 0 0 0  Total GAD 7 Score 0 0 0 0  Anxiety Difficulty Not difficult at all Not difficult at all  Not difficult at all       01/23/2022   11:10 AM 07/17/2021    1:44 PM 05/08/2021    3:49 PM  Depression screen PHQ 2/9  Decreased Interest 0 0 0  Down, Depressed, Hopeless 0 0 0  PHQ - 2 Score 0 0 0  Altered sleeping 0 0 0  Tired, decreased energy 0 0 0  Change in appetite 0 0 0  Feeling bad or failure about yourself  0 0 0  Trouble concentrating 0 0 0  Moving slowly or fidgety/restless 0 0 0  Suicidal thoughts 0 0 0  PHQ-9 Score 0 0 0  Difficult doing work/chores Not difficult at all Not difficult at all Not difficult at all    BP Readings from Last 3 Encounters:  02/12/22 132/78  02/08/22 (!) 140/92  01/23/22 128/76    Physical Exam Constitutional:      Appearance: She is well-developed.  HENT:     Right Ear: Ear canal and external ear normal. Tympanic membrane is not erythematous or retracted.     Left Ear: Ear canal and external ear normal. Tympanic membrane is not erythematous or retracted.     Nose:     Right Sinus: Maxillary sinus tenderness and frontal sinus tenderness present.     Left Sinus: Maxillary sinus tenderness and frontal sinus tenderness present.     Mouth/Throat:     Mouth: No oral lesions.     Pharynx: Uvula midline. Posterior oropharyngeal erythema present. No oropharyngeal exudate.      Comments: Left tonsilith  Cardiovascular:     Rate and Rhythm: Normal rate and regular rhythm.     Heart sounds: Normal heart sounds.   Pulmonary:     Breath sounds: Normal breath sounds. No wheezing or rales.  Lymphadenopathy:     Cervical: No cervical adenopathy.  Neurological:     Mental Status: She is alert and oriented to person, place, and time.     Wt Readings from Last 3 Encounters:  02/12/22 285 lb (129.3 kg)  02/08/22  295 lb (133.8 kg)  01/23/22 283 lb (128.4 kg)    BP 132/78 (BP Location: Left Arm, Cuff Size: Large)   Pulse 67   Temp 98 F (36.7 C) (Oral)   Ht _0  (1.702 m)   Wt 285 lb (129.3 kg)   SpO2 96%   BMI 44.64 kg/m   Assessment and Plan: 1. Acute bacterial rhinosinusitis Continue Augmentin and decongestants Will refill cough syrup - pt cautioned to use only 5 mg qid. - promethazine-dextromethorphan (PROMETHAZINE-DM) 6.25-15 MG/5ML syrup; Take 5 mLs by mouth 4 (four) times daily as needed for up to 9 days for cough.  Dispense: 118 mL; Refill: 0  2. Tonsillith Recommend salt water gargles to help dislodge This is likely the cause of her throat discomfort   Partially dictated using Editor, commissioning. Any errors are unintentional.  Halina Maidens, MD [facility]  02/12/2022

## 2022-02-13 ENCOUNTER — Other Ambulatory Visit: Payer: Self-pay

## 2022-02-13 ENCOUNTER — Telehealth: Payer: Self-pay | Admitting: Internal Medicine

## 2022-02-13 ENCOUNTER — Ambulatory Visit: Payer: Self-pay | Admitting: Internal Medicine

## 2022-02-13 DIAGNOSIS — B9689 Other specified bacterial agents as the cause of diseases classified elsewhere: Secondary | ICD-10-CM

## 2022-02-13 NOTE — Telephone Encounter (Signed)
Referral ordered

## 2022-02-13 NOTE — Telephone Encounter (Signed)
Can referral be placed to ENT?  KP

## 2022-02-13 NOTE — Telephone Encounter (Signed)
Referral placed.  KP 

## 2022-02-13 NOTE — Telephone Encounter (Signed)
Pt wanting referral to [facility]-  Coughing last night and sore throat and difficulty swallowing. Pt stated PCP saw tonsil stones and pt wants these removed ASAP. Pt stated having severe pain. Pt stated she may go to ED to see if they can call in ENT to remove stone.Purpose of call was for referral. Reason for Disposition  SEVERE (e.g., excruciating) throat pain  Answer Assessment - Initial Assessment Questions 1. ONSET: "When did the throat start hurting?" (Hours or days ago)      *No Answer* 2. SEVERITY: "How bad is the sore throat?" (Scale 1-10; mild, moderate or severe)   - MILD (1-3):  Doesn't interfere with eating or normal activities.   - MODERATE (4-7): Interferes with eating some solids and normal activities.   - SEVERE (8-10):  Excruciating pain, interferes with most normal activities.   - SEVERE WITH DYSPHAGIA (10): Can't swallow liquids, drooling.     Severe  3. STREP EXPOSURE: "Has there been any exposure to strep within the past week?" If Yes, ask: "What type of contact occurred?"      N/a 4.  VIRAL SYMPTOMS: "Are there any symptoms of a cold, such as a runny nose, cough, hoarse voice or red eyes?"      cough 5. FEVER: "Do you have a fever?" If Yes, ask: "What is your temperature, how was it measured, and when did it start?"     no 6. PUS ON THE TONSILS: "Is there pus on the tonsils in the back of your throat?"     Tonsil stones seen by [facility] 7. OTHER SYMPTOMS: "Do you have any other symptoms?" (e.g., difficulty breathing, headache, rash)     Difficulty swallowing 8. PREGNANCY: "Is there any chance you are pregnant?" "When was your last menstrual period?"     N/a  Protocols used: Sore Throat-A-AH

## 2022-02-13 NOTE — Telephone Encounter (Signed)
Pt called back. She called [facility] and they are 4 weeks out for appointments. Pt did leave her contact information. Pt would like to have that referral , but would also like a 2nd referral to a different ENT.  She would like a referral to:  [facility] at [facility] [place] [address] [phone]

## 2022-02-13 NOTE — Telephone Encounter (Signed)
Copied from [place] [id number]. Topic: Referral - Request for Referral >> Feb 13, 2022  8:27 AM Chapman Fitch wrote: Has patient seen PCP for this complaint? Yes  *If NO, is insurance requiring patient see PCP for this issue before PCP can refer them? Referral for which specialty: ENT Preferred provider/office: [facility] at [facility] / [address] [place] , [place]/ phone# [phone] Reason for referral: Tonsil stones/ pt asked for referral to [facility] but they can't see her soon enough / so she would also like a referral sent to the location above >> Feb 13, 2022  8:58 AM Charlotte Sanes J wrote: Pt called back again and [facility] wont see pt unless she was an existing pt / pt was able to call [facility] and they gave her an appt for 2 pm tomorrow with Dr. Kathyrn Sheriff she needs a referral for that appt tomorrow

## 2022-02-14 ENCOUNTER — Ambulatory Visit: Payer: BC Managed Care – PPO | Admitting: Internal Medicine

## 2022-02-14 DIAGNOSIS — J039 Acute tonsillitis, unspecified: Secondary | ICD-10-CM | POA: Diagnosis not present

## 2022-02-14 DIAGNOSIS — J358 Other chronic diseases of tonsils and adenoids: Secondary | ICD-10-CM | POA: Diagnosis not present

## 2022-03-22 ENCOUNTER — Encounter: Payer: Self-pay | Admitting: Internal Medicine

## 2022-03-22 ENCOUNTER — Other Ambulatory Visit: Payer: Self-pay

## 2022-03-22 DIAGNOSIS — I10 Essential (primary) hypertension: Secondary | ICD-10-CM

## 2022-03-22 MED ORDER — BYSTOLIC 5 MG PO TABS: 5.0000 mg | ORAL_TABLET | Freq: Every day | ORAL | 1 refills | Status: DC

## 2022-03-27 ENCOUNTER — Encounter: Payer: Self-pay | Admitting: Internal Medicine

## 2022-03-27 NOTE — Progress Notes (Unsigned)
Date:  03/28/2022   Name:  Courtney Robinson   DOB:  1955-09-10   MRN:  [id number]   Chief Complaint: No chief complaint on file. Courtney Mar Amaiah Cristiano is a 66 y.o. female who presents today for her Complete Annual Exam. She feels {DESC; WELL/FAIRLY WELL/POORLY:18703}. She reports exercising ***. She reports she is sleeping {DESC; WELL/FAIRLY WELL/POORLY:18703}. Breast complaints ***.  Mammogram: 01/2022 DEXA: 04/2020 osteopenia hip/normal spine and wrist Pap smear: 07/2019 neg thin prep; aged out Colonoscopy: 2012; Cologuard 01/2021 negative  Health Maintenance Due  Topic Date Due   COLONOSCOPY (Pts 45-56yr Insurance coverage will need to be confirmed)  05/27/2020    Immunization History  Administered Date(s) Administered   Fluad Quad(high Dose 65+) 12/20/2020, 01/08/2022   H1N1 06/30/2008   Influenza Whole 12/21/2009   Influenza,inj,Quad PF,6+ Mos 01/19/2015, 02/06/2017, 01/30/2018, 12/10/2018, 12/15/2019   Janssen (J&J) SARS-COV-2 Vaccination 06/26/2019   Moderna Covid-19 Vaccine Bivalent Booster 119yr& up 02/09/2020, 12/13/2020   PNEUMOCOCCAL CONJUGATE-20 12/20/2020   Td 04/09/2007   Tdap 12/10/2018   Zoster Recombinat (Shingrix) 04/08/2019, 10/07/2019    Hypertension This is a chronic problem. The problem is controlled. Pertinent negatives include no chest pain, headaches, palpitations or shortness of breath. Past treatments include beta blockers. The current treatment provides significant improvement. There are no compliance problems.  Hypertensive end-organ damage includes kidney disease. There is no history of CAD/MI or CVA.    Lab Results  Component Value Date   NA 140 05/08/2021   K 4.7 05/08/2021   CO2 22 05/08/2021   GLUCOSE 102 (H) 05/08/2021   BUN 16 05/08/2021   CREATININE 1.06 (H) 05/08/2021   CALCIUM 9.3 05/08/2021   EGFR 58 (L) 05/08/2021   GFRNONAA 57 (L) 12/15/2019   Lab Results  Component Value Date   CHOL 215 (H) 12/20/2020   HDL  59 12/20/2020   LDLCALC 127 (H) 12/20/2020   TRIG 167 (H) 12/20/2020   CHOLHDL 3.6 12/20/2020   Lab Results  Component Value Date   TSH 2.210 12/20/2020   Lab Results  Component Value Date   HGBA1C 6.3 (H) 12/20/2020   Lab Results  Component Value Date   WBC 8.3 05/08/2021   HGB 14.0 05/08/2021   HCT 42.3 05/08/2021   MCV 91 05/08/2021   PLT 220 05/08/2021   Lab Results  Component Value Date   ALT 15 05/08/2021   AST 21 05/08/2021   ALKPHOS 79 05/08/2021   BILITOT 0.4 05/08/2021   No results found for: "25OHVITD2", "25OHVITD3", "VD25OH"   Review of Systems  Constitutional:  Negative for chills, fatigue and fever.  HENT:  Negative for congestion, hearing loss, tinnitus, trouble swallowing and voice change.   Eyes:  Negative for visual disturbance.  Respiratory:  Negative for cough, chest tightness, shortness of breath and wheezing.   Cardiovascular:  Negative for chest pain, palpitations and leg swelling.  Gastrointestinal:  Negative for abdominal pain, constipation, diarrhea and vomiting.  Endocrine: Negative for polydipsia and polyuria.  Genitourinary:  Negative for dysuria, frequency, genital sores, vaginal bleeding and vaginal discharge.  Musculoskeletal:  Negative for arthralgias, gait problem and joint swelling.  Skin:  Negative for color change and rash.  Neurological:  Negative for dizziness, tremors, light-headedness and headaches.  Hematological:  Negative for adenopathy. Does not bruise/bleed easily.  Psychiatric/Behavioral:  Negative for dysphoric mood and sleep disturbance. The patient is not nervous/anxious.     Patient Active Problem List   Diagnosis Date Noted  Acute bacterial rhinosinusitis 02/08/2022   HSV (herpes simplex virus) infection 12/15/2019   Rectocele 07/27/2019   Arthralgia of left temporomandibular joint 10/15/2017   Pre-diabetes 08/16/2017   Environmental and seasonal allergies 07/20/2016   Blepharospasm 07/10/2016   Left knee pain  08/12/2015   History of palpitations 08/12/2015   Essential (primary) hypertension 01/17/2015   Arthralgia of hand 01/17/2015   Adult BMI 40.0-44.9 kg/sq m ([place]) 12/30/2009   Mixed hyperlipidemia 03/25/2009    Allergies  Allergen Reactions   Hydrochlorothiazide Shortness Of Breath, Swelling and Other (See Comments)    Patient states throat swelled up, felt SOB, chest pain, and tingling in arms and shoulders.   Amlodipine    Hydrocodone    Codeine Nausea And Vomiting   Lisinopril Cough   Loratadine Palpitations   Metoprolol Other (See Comments)    bradycardia   Olmesartan Rash    Past Surgical History:  Procedure Laterality Date   CHOLECYSTECTOMY  2013   ENDOMETRIAL BIOPSY  11/16/09   Disordered Proliferative Endometrium, negative for hyperplasia, atypia or malignancy    Social History   Tobacco Use   Smoking status: Never   Smokeless tobacco: Never  Vaping Use   Vaping Use: Never used  Substance Use Topics   Alcohol use: No    Alcohol/week: 0.0 standard drinks of alcohol   Drug use: No     Medication list has been reviewed and updated.  No outpatient medications have been marked as taking for the 03/28/22 encounter (Appointment) with Glean Hess, MD.       01/23/2022   11:10 AM 07/17/2021    1:44 PM 05/08/2021    3:49 PM 03/13/2021    4:19 PM  GAD 7 : Generalized Anxiety Score  Nervous, Anxious, on Edge 0 0 0 0  Control/stop worrying 0 0 0 0  Worry too much - different things 0 0 0 0  Trouble relaxing 0 0 0 0  Restless 0 0 0 0  Easily annoyed or irritable 0 0 0 0  Afraid - awful might happen 0 0 0 0  Total GAD 7 Score 0 0 0 0  Anxiety Difficulty Not difficult at all Not difficult at all  Not difficult at all       01/23/2022   11:10 AM 07/17/2021    1:44 PM 05/08/2021    3:49 PM  Depression screen PHQ 2/9  Decreased Interest 0 0 0  Down, Depressed, Hopeless 0 0 0  PHQ - 2 Score 0 0 0  Altered sleeping 0 0 0  Tired, decreased energy 0 0 0   Change in appetite 0 0 0  Feeling bad or failure about yourself  0 0 0  Trouble concentrating 0 0 0  Moving slowly or fidgety/restless 0 0 0  Suicidal thoughts 0 0 0  PHQ-9 Score 0 0 0  Difficult doing work/chores Not difficult at all Not difficult at all Not difficult at all    BP Readings from Last 3 Encounters:  02/12/22 132/78  02/08/22 (!) 140/92  01/23/22 128/76    Physical Exam Vitals and nursing note reviewed.  Constitutional:      General: She is not in acute distress.    Appearance: She is well-developed.  HENT:     Head: Normocephalic and atraumatic.     Right Ear: Tympanic membrane and ear canal normal.     Left Ear: Tympanic membrane and ear canal normal.     Nose:     Right  Sinus: No maxillary sinus tenderness.     Left Sinus: No maxillary sinus tenderness.  Eyes:     General: No scleral icterus.       Right eye: No discharge.        Left eye: No discharge.     Conjunctiva/sclera: Conjunctivae normal.  Neck:     Thyroid: No thyromegaly.     Vascular: No carotid bruit.  Cardiovascular:     Rate and Rhythm: Normal rate and regular rhythm.     Pulses: Normal pulses.     Heart sounds: Normal heart sounds.  Pulmonary:     Effort: Pulmonary effort is normal. No respiratory distress.     Breath sounds: No wheezing.  Chest:  Breasts:    Right: No mass, nipple discharge, skin change or tenderness.     Left: No mass, nipple discharge, skin change or tenderness.  Abdominal:     General: Bowel sounds are normal.     Palpations: Abdomen is soft.     Tenderness: There is no abdominal tenderness.  Musculoskeletal:     Cervical back: Normal range of motion. No erythema.     Right lower leg: No edema.     Left lower leg: No edema.  Lymphadenopathy:     Cervical: No cervical adenopathy.  Skin:    General: Skin is warm and dry.     Findings: No rash.  Neurological:     General: No focal deficit present.     Mental Status: She is alert and oriented to person,  place, and time.     Cranial Nerves: No cranial nerve deficit.     Sensory: No sensory deficit.     Deep Tendon Reflexes: Reflexes are normal and symmetric.  Psychiatric:        Attention and Perception: Attention normal.        Mood and Affect: Mood normal.     Wt Readings from Last 3 Encounters:  02/12/22 285 lb (129.3 kg)  02/08/22 295 lb (133.8 kg)  01/23/22 283 lb (128.4 kg)    There were no vitals taken for this visit.  Assessment and Plan:

## 2022-03-28 ENCOUNTER — Other Ambulatory Visit (HOSPITAL_COMMUNITY)
Admission: RE | Admit: 2022-03-28 | Discharge: 2022-03-28 | Disposition: A | Discharge status: Home / Self Care | Payer: BC Managed Care – PPO | Source: Ambulatory Visit | Attending: Internal Medicine | Admitting: Internal Medicine

## 2022-03-28 ENCOUNTER — Ambulatory Visit (INDEPENDENT_AMBULATORY_CARE_PROVIDER_SITE_OTHER): Payer: BC Managed Care – PPO | Admitting: Internal Medicine

## 2022-03-28 ENCOUNTER — Other Ambulatory Visit
Admission: RE | Admit: 2022-03-28 | Discharge: 2022-03-28 | Disposition: A | Discharge status: Home / Self Care | Payer: BC Managed Care – PPO | Attending: Internal Medicine | Admitting: Internal Medicine

## 2022-03-28 ENCOUNTER — Encounter: Payer: Self-pay | Admitting: Internal Medicine

## 2022-03-28 VITALS — BP 128/60 | HR 64 | Ht 67.0 in | Wt 292.0 lb

## 2022-03-28 DIAGNOSIS — I10 Essential (primary) hypertension: Secondary | ICD-10-CM | POA: Diagnosis not present

## 2022-03-28 DIAGNOSIS — Z124 Encounter for screening for malignant neoplasm of cervix: Secondary | ICD-10-CM | POA: Diagnosis not present

## 2022-03-28 DIAGNOSIS — R7303 Prediabetes: Secondary | ICD-10-CM

## 2022-03-28 DIAGNOSIS — Z0000 Encounter for general adult medical examination without abnormal findings: Secondary | ICD-10-CM

## 2022-03-28 DIAGNOSIS — E782 Mixed hyperlipidemia: Secondary | ICD-10-CM

## 2022-03-28 DIAGNOSIS — Z1211 Encounter for screening for malignant neoplasm of colon: Secondary | ICD-10-CM

## 2022-03-28 DIAGNOSIS — M85859 Other specified disorders of bone density and structure, unspecified thigh: Secondary | ICD-10-CM

## 2022-03-28 DIAGNOSIS — M8580 Other specified disorders of bone density and structure, unspecified site: Secondary | ICD-10-CM | POA: Insufficient documentation

## 2022-03-28 LAB — POCT URINALYSIS DIPSTICK
Bilirubin, UA: NEGATIVE
Glucose, UA: NEGATIVE
Ketones, UA: NEGATIVE
Nitrite, UA: NEGATIVE
Protein, UA: NEGATIVE
Spec Grav, UA: 1.02 (ref 1.010–1.025)
Urobilinogen, UA: 0.2 E.U./dL
pH, UA: 6 (ref 5.0–8.0)

## 2022-03-28 NOTE — Patient Instructions (Signed)
Call [facility] to schedule your bone density at [phone].

## 2022-03-29 ENCOUNTER — Other Ambulatory Visit: Payer: Self-pay

## 2022-03-29 LAB — COMPREHENSIVE METABOLIC PANEL
ALT: 11 IU/L (ref 0–32)
AST: 17 IU/L (ref 0–40)
Albumin/Globulin Ratio: 1.3 (ref 1.2–2.2)
Albumin: 4.1 g/dL (ref 3.9–4.9)
Alkaline Phosphatase: 82 IU/L (ref 44–121)
BUN/Creatinine Ratio: 14 (ref 12–28)
BUN: 16 mg/dL (ref 8–27)
Bilirubin Total: 0.3 mg/dL (ref 0.0–1.2)
CO2: 22 mmol/L (ref 20–29)
Calcium: 9.4 mg/dL (ref 8.7–10.3)
Chloride: 103 mmol/L (ref 96–106)
Creatinine, Ser: 1.12 mg/dL — ABNORMAL HIGH (ref 0.57–1.00)
Globulin, Total: 3.2 g/dL (ref 1.5–4.5)
Glucose: 104 mg/dL — ABNORMAL HIGH (ref 70–99)
Potassium: 4.5 mmol/L (ref 3.5–5.2)
Sodium: 139 mmol/L (ref 134–144)
Total Protein: 7.3 g/dL (ref 6.0–8.5)
eGFR: 54 mL/min/{1.73_m2} — ABNORMAL LOW (ref >59)

## 2022-03-29 LAB — CBC WITH DIFFERENTIAL/PLATELET
Basophils Absolute: 0 10*3/uL (ref 0.0–0.2)
Basos: 1 %
EOS (ABSOLUTE): 0.1 10*3/uL (ref 0.0–0.4)
Eos: 2 %
Hematocrit: 38.6 % (ref 34.0–46.6)
Hemoglobin: 12.9 g/dL (ref 11.1–15.9)
Immature Grans (Abs): 0 10*3/uL (ref 0.0–0.1)
Immature Granulocytes: 0 %
Lymphocytes Absolute: 2.2 10*3/uL (ref 0.7–3.1)
Lymphs: 32 %
MCH: 30.4 pg (ref 26.6–33.0)
MCHC: 33.4 g/dL (ref 31.5–35.7)
MCV: 91 fL (ref 79–97)
Monocytes Absolute: 0.5 10*3/uL (ref 0.1–0.9)
Monocytes: 8 %
Neutrophils Absolute: 3.9 10*3/uL (ref 1.4–7.0)
Neutrophils: 57 %
Platelets: 247 10*3/uL (ref 150–450)
RBC: 4.25 x10E6/uL (ref 3.77–5.28)
RDW: 13.5 % (ref 11.7–15.4)
WBC: 6.7 10*3/uL (ref 3.4–10.8)

## 2022-03-29 LAB — LIPID PANEL
Chol/HDL Ratio: 3.3 ratio (ref 0.0–4.4)
Cholesterol, Total: 203 mg/dL — ABNORMAL HIGH (ref 100–199)
HDL: 61 mg/dL (ref >39)
LDL Chol Calc (NIH): 116 mg/dL — ABNORMAL HIGH (ref 0–99)
Triglycerides: 151 mg/dL — ABNORMAL HIGH (ref 0–149)
VLDL Cholesterol Cal: 26 mg/dL (ref 5–40)

## 2022-03-29 LAB — HEMOGLOBIN A1C
Est. average glucose Bld gHb Est-mCnc: 128 mg/dL
Hgb A1c MFr Bld: 6.1 % — ABNORMAL HIGH (ref 4.8–5.6)

## 2022-03-29 LAB — VITAMIN D 25 HYDROXY (VIT D DEFICIENCY, FRACTURES): Vit D, 25-Hydroxy: 18.6 ng/mL — ABNORMAL LOW (ref 30.0–100.0)

## 2022-03-29 LAB — TSH: TSH: 1.46 u[IU]/mL (ref 0.450–4.500)

## 2022-03-29 MED ORDER — VITAMIN D (ERGOCALCIFEROL) 1.25 MG (50000 UNIT) PO CAPS: 50000.0000 [IU] | ORAL_CAPSULE | ORAL | 0 refills | Status: DC

## 2022-04-03 LAB — CYTOLOGY - PAP
Comment: NEGATIVE
Diagnosis: NEGATIVE
High risk HPV: NEGATIVE

## 2022-05-05 IMAGING — MG DIGITAL SCREENING BILAT W/ TOMO W/ CAD
8 series · 8 of 24 positions shown · non-contrast
Comparison: Previous exam(s).

ACR Breast Density Category a: The breast tissue is almost entirely
fatty.

CLINICAL DATA: Screening.

EXAM:
DIGITAL SCREENING BILATERAL MAMMOGRAM WITH TOMO AND CAD

[R MLO synth-2D]
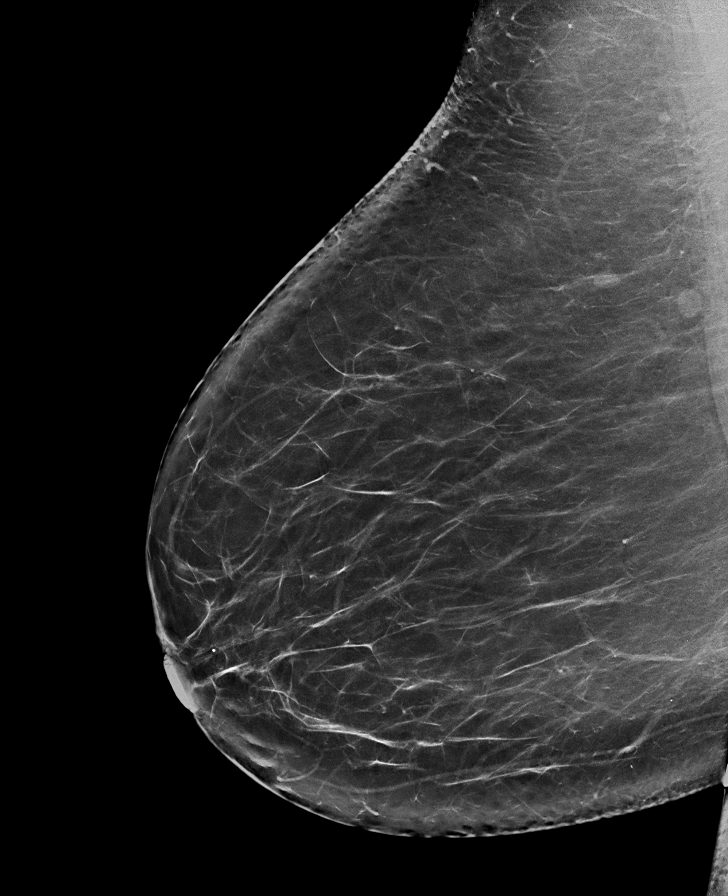

[L CC synth-2D]
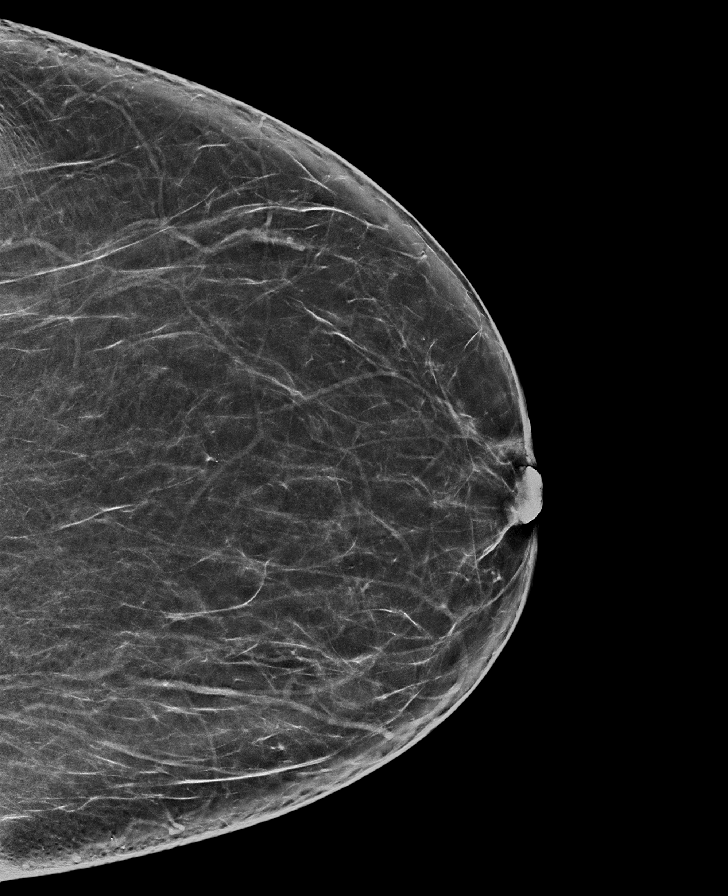

[R CC synth-2D]
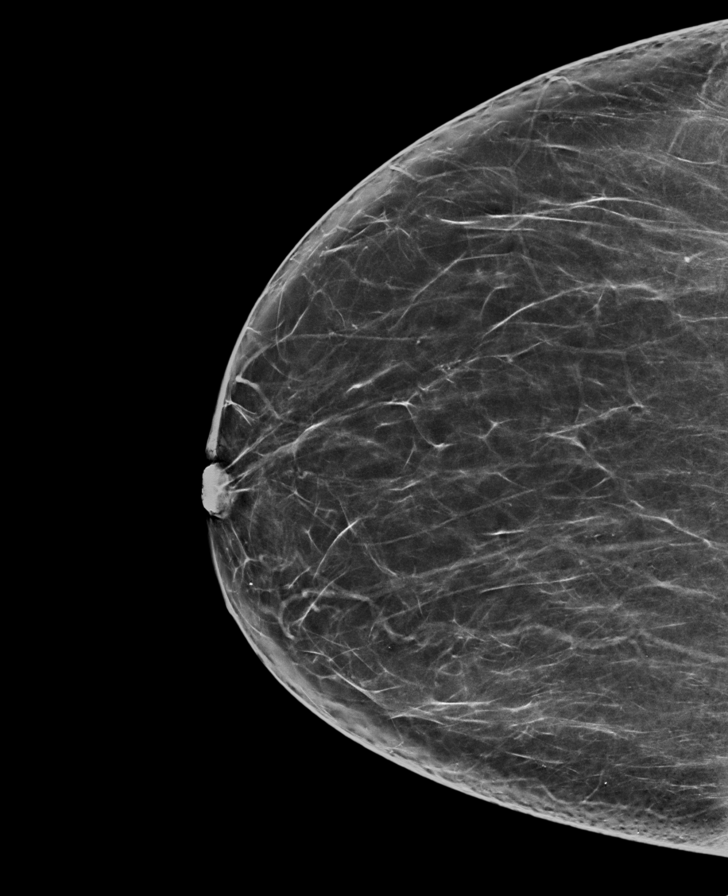

[L MLO synth-2D]
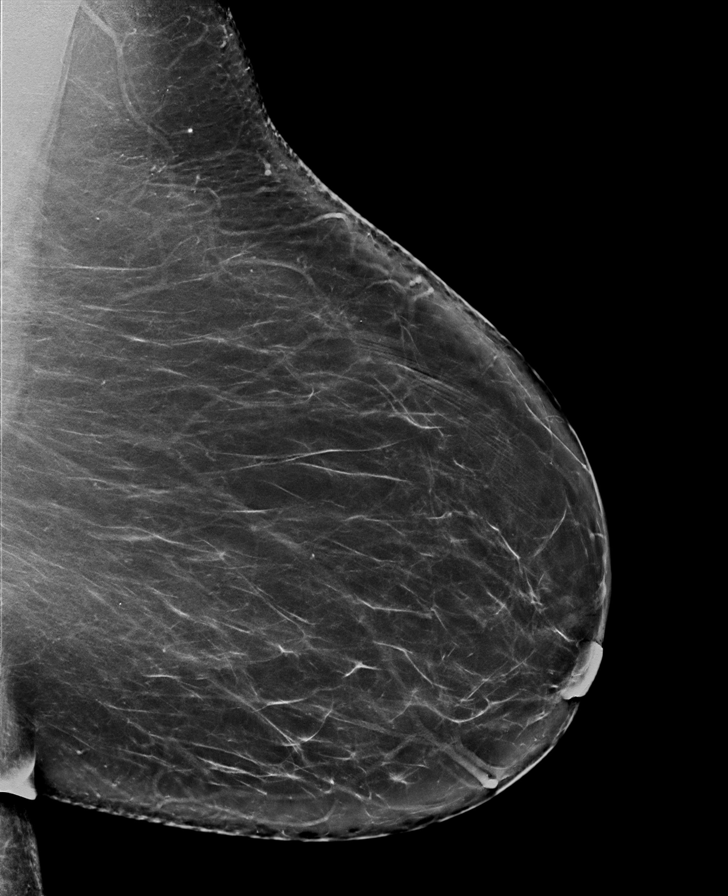

[R CC tomo · tomo slice 41/80.0]
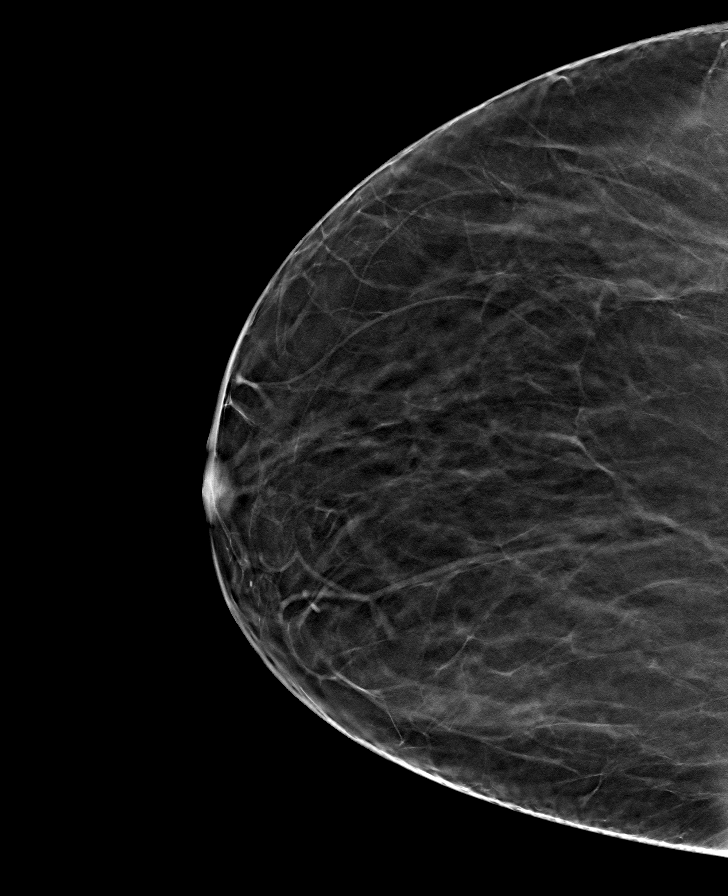

[L CC tomo · tomo slice 41/80.0]
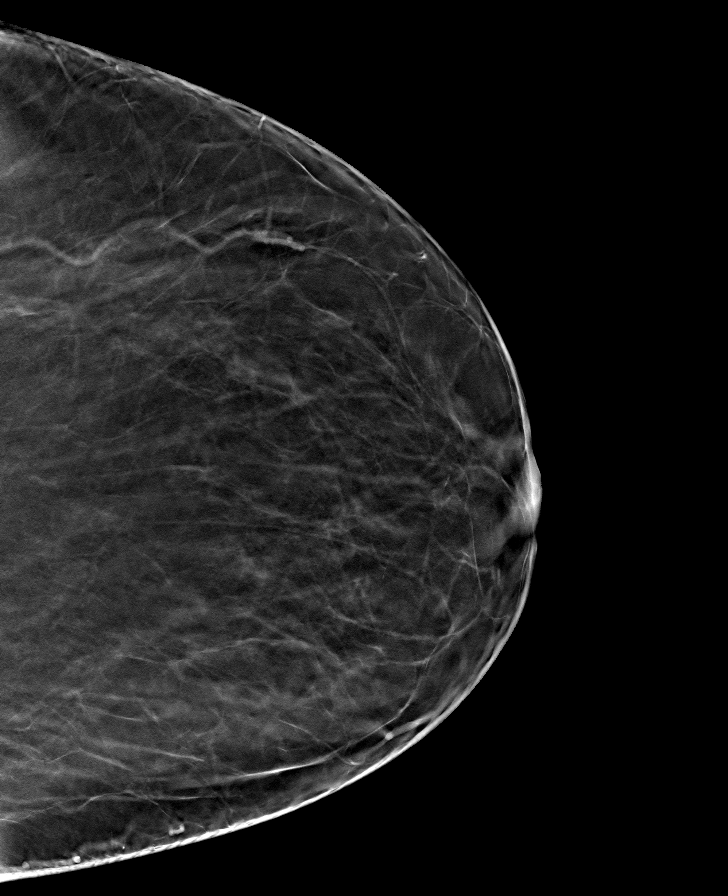

[L MLO tomo · tomo slice 51/100.0]
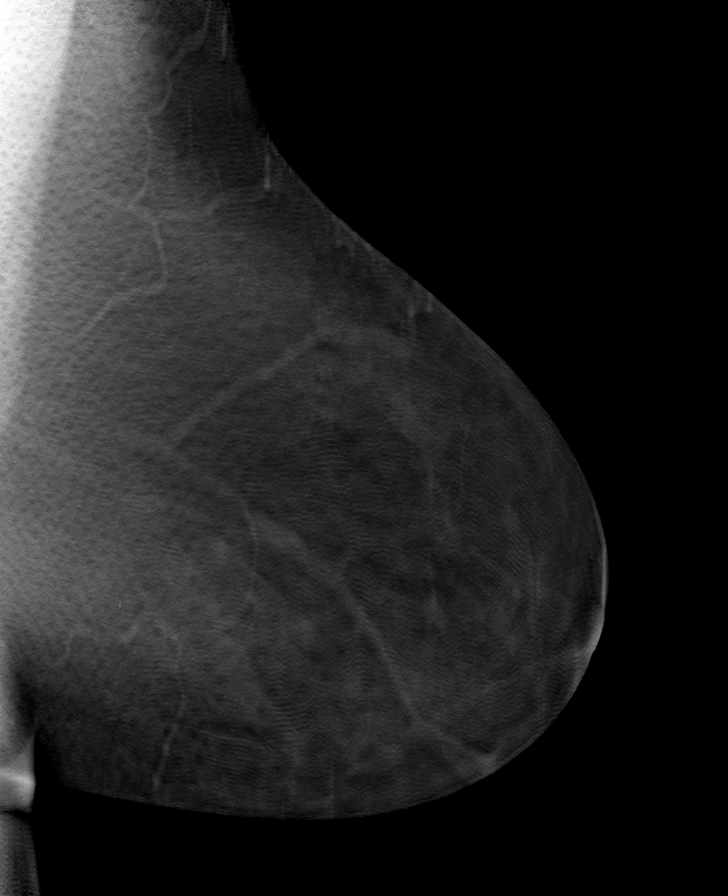

[R MLO tomo · tomo slice 49/98.0]
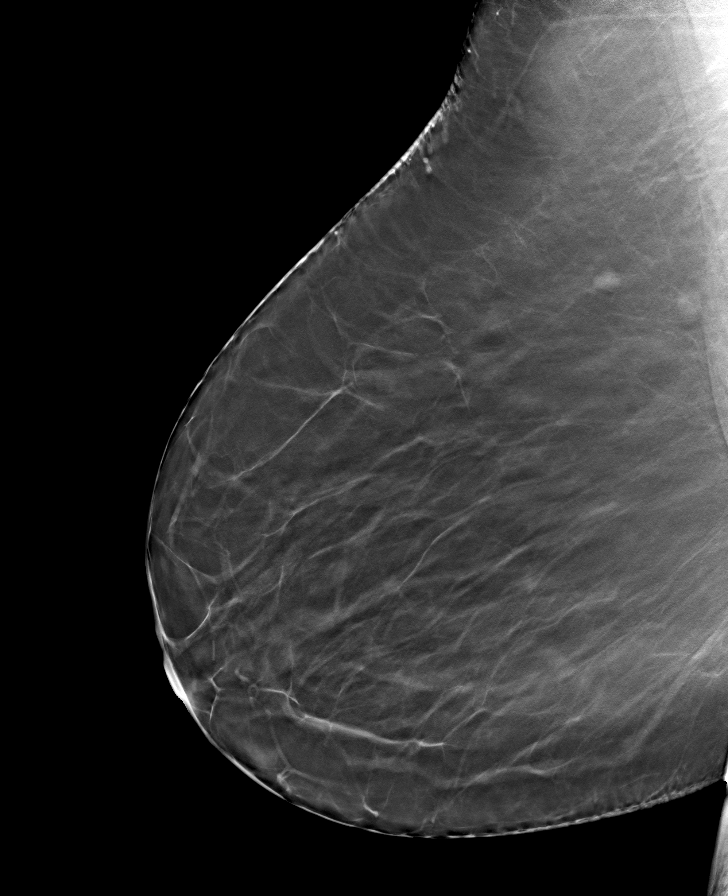

[8 of 24 positions shown; findings below may reference images not displayed]

FINDINGS: There are no findings suspicious for malignancy. Images were
processed with CAD.
IMPRESSION: No mammographic evidence of malignancy. A result letter of this
screening mammogram will be mailed directly to the patient.

RECOMMENDATION:
Screening mammogram in one year. (Code:[id number]-[id number])

BI-RADS CATEGORY  1: Negative.

## 2022-05-09 ENCOUNTER — Ambulatory Visit: Payer: Self-pay

## 2022-05-09 NOTE — Telephone Encounter (Signed)
     Chief Complaint: Sinus pain, concerned about RSV. congestion with green-yellow mucus and blood. Symptoms: Above Frequency: Friday Pertinent Negatives: Patient denies fever Disposition: '[]'$ ED /'[]'$ Urgent Care (no appt availability in office) / '[x]'$ Appointment(In office/virtual)/ '[]'$  [facility]/ '[]'$ Home Care/ '[]'$ Refused Recommended Disposition /'[]'$ [facility]/ '[]'$  Follow-up with PCP Additional Notes:  Reason for Disposition  [1] SEVERE pain AND [2] not improved 2 hours after pain medicine  Answer Assessment - Initial Assessment Questions 1. LOCATION: "Where does it hurt?"      Face, sinus 2. ONSET: "When did the sinus pain start?"  (e.g., hours, days)      Friday 3. SEVERITY: "How bad is the pain?"   (Scale 1-10; mild, moderate or severe)   - MILD (1-3): doesn't interfere with normal activities    - MODERATE (4-7): interferes with normal activities (e.g., work or school) or awakens from sleep   - SEVERE (8-10): excruciating pain and patient unable to do any normal activities        Moderate 4. RECURRENT SYMPTOM: "Have you ever had sinus problems before?" If Yes, ask: "When was the last time?" and "What happened that time?"      Yes 5. NASAL CONGESTION: "Is the nose blocked?" If Yes, ask: "Can you open it or must you breathe through your mouth?"     Blocked 6. NASAL DISCHARGE: "Do you have discharge from your nose?" If so ask, "What color?"     Green 7. FEVER: "Do you have a fever?" If Yes, ask: "What is it, how was it measured, and when did it start?"      No 8. OTHER SYMPTOMS: "Do you have any other symptoms?" (e.g., sore throat, cough, earache, difficulty breathing)     Congestion 9. PREGNANCY: "Is there any chance you are pregnant?" "When was your last menstrual period?"     No  Protocols used: Sinus Pain or Congestion-A-AH

## 2022-05-10 ENCOUNTER — Ambulatory Visit: Payer: BC Managed Care – PPO | Admitting: Family Medicine

## 2022-05-10 ENCOUNTER — Encounter: Payer: Self-pay | Admitting: Family Medicine

## 2022-05-10 ENCOUNTER — Telehealth: Payer: Self-pay | Admitting: Internal Medicine

## 2022-05-10 VITALS — BP 128/74 | HR 60 | Temp 98.4°F | Ht 67.0 in | Wt 292.0 lb

## 2022-05-10 DIAGNOSIS — R052 Subacute cough: Secondary | ICD-10-CM

## 2022-05-10 DIAGNOSIS — J0100 Acute maxillary sinusitis, unspecified: Secondary | ICD-10-CM

## 2022-05-10 DIAGNOSIS — R430 Anosmia: Secondary | ICD-10-CM

## 2022-05-10 LAB — POC COVID19 BINAXNOW: SARS Coronavirus 2 Ag: NEGATIVE

## 2022-05-10 MED ORDER — PROMETHAZINE-DM 6.25-15 MG/5ML PO SYRP: 5.0000 mL | ORAL_SOLUTION | Freq: Four times a day (QID) | ORAL | 0 refills | Status: DC | PRN

## 2022-05-10 MED ORDER — AMOXICILLIN-POT CLAVULANATE 875-125 MG PO TABS: 1.0000 | ORAL_TABLET | Freq: Two times a day (BID) | ORAL | 0 refills | Status: DC

## 2022-05-10 NOTE — Progress Notes (Signed)
Date:  05/10/2022   Name:  Courtney Robinson   DOB:  07-21-1955   MRN:  [id number]   Chief Complaint: Sinusitis (Started over weekend, blowing out stuff and having drainage- cough- yellow production)  Sinusitis This is a new problem. The current episode started in the past 7 days (Saturday). There has been no fever. Associated symptoms include congestion, coughing, ear pain, sinus pressure and a sore throat. Pertinent negatives include no chills, headaches or shortness of breath. Past treatments include nothing.    Lab Results  Component Value Date   NA 139 03/28/2022   K 4.5 03/28/2022   CO2 22 03/28/2022   GLUCOSE 104 (H) 03/28/2022   BUN 16 03/28/2022   CREATININE 1.12 (H) 03/28/2022   CALCIUM 9.4 03/28/2022   EGFR 54 (L) 03/28/2022   GFRNONAA 57 (L) 12/15/2019   Lab Results  Component Value Date   CHOL 203 (H) 03/28/2022   HDL 61 03/28/2022   LDLCALC 116 (H) 03/28/2022   TRIG 151 (H) 03/28/2022   CHOLHDL 3.3 03/28/2022   Lab Results  Component Value Date   TSH 1.460 03/28/2022   Lab Results  Component Value Date   HGBA1C 6.1 (H) 03/28/2022   Lab Results  Component Value Date   WBC 6.7 03/28/2022   HGB 12.9 03/28/2022   HCT 38.6 03/28/2022   MCV 91 03/28/2022   PLT 247 03/28/2022   Lab Results  Component Value Date   ALT 11 03/28/2022   AST 17 03/28/2022   ALKPHOS 82 03/28/2022   BILITOT 0.3 03/28/2022   Lab Results  Component Value Date   VD25OH 18.6 (L) 03/28/2022     Review of Systems  Constitutional:  Negative for chills.  HENT:  Positive for congestion, ear pain, sinus pressure and sore throat.   Respiratory:  Positive for cough. Negative for shortness of breath.   Neurological:  Negative for headaches.    Patient Active Problem List   Diagnosis Date Noted   Osteopenia 03/28/2022   HSV (herpes simplex virus) infection 12/15/2019   Rectocele 07/27/2019   Arthralgia of left temporomandibular joint 10/15/2017   Pre-diabetes  08/16/2017   Environmental and seasonal allergies 07/20/2016   Blepharospasm 07/10/2016   Left knee pain 08/12/2015   History of palpitations 08/12/2015   Essential (primary) hypertension 01/17/2015   Arthralgia of hand 01/17/2015   Adult BMI 40.0-44.9 kg/sq m ([place]) 12/30/2009   Mixed hyperlipidemia 03/25/2009    Allergies  Allergen Reactions   Hydrochlorothiazide Shortness Of Breath, Swelling and Other (See Comments)    Patient states throat swelled up, felt SOB, chest pain, and tingling in arms and shoulders.   Amlodipine    Hydrocodone    Codeine Nausea And Vomiting   Lisinopril Cough   Loratadine Palpitations   Metoprolol Other (See Comments)    bradycardia   Olmesartan Rash    Past Surgical History:  Procedure Laterality Date   CHOLECYSTECTOMY  2013   ENDOMETRIAL BIOPSY  11/16/09   Disordered Proliferative Endometrium, negative for hyperplasia, atypia or malignancy    Social History   Tobacco Use   Smoking status: Never   Smokeless tobacco: Never  Vaping Use   Vaping Use: Never used  Substance Use Topics   Alcohol use: No    Alcohol/week: 0.0 standard drinks of alcohol   Drug use: No     Medication list has been reviewed and updated.  Current Meds  Medication Sig   Ascorbic Acid (VITAMIN C) 1000 MG  tablet Take 1,000 mg by mouth daily.   BYSTOLIC 5 MG tablet Take 1 tablet (5 mg total) by mouth daily.   D-Mannose POWD Take by mouth 3 (three) times a week.   UNABLE TO FIND A-F Betafood   valACYclovir (VALTREX) 1000 MG tablet Take 1 tablet (1,000 mg total) by mouth 2 (two) times daily. 2 tabs every 12 hours for 2 doses as needed   Vitamin D, Ergocalciferol, (DRISDOL) 1.25 MG (50000 UNIT) CAPS capsule Take 1 capsule (50,000 Units total) by mouth every 7 (seven) days.       05/10/2022    1:07 PM 03/28/2022    8:10 AM 01/23/2022   11:10 AM 07/17/2021    1:44 PM  GAD 7 : Generalized Anxiety Score  Nervous, Anxious, on Edge 0 0 0 0  Control/stop worrying 0 0  0 0  Worry too much - different things 0 0 0 0  Trouble relaxing 0 0 0 0  Restless 0 0 0 0  Easily annoyed or irritable 0 0 0 0  Afraid - awful might happen 0 0 0 0  Total GAD 7 Score 0 0 0 0  Anxiety Difficulty Not difficult at all Not difficult at all Not difficult at all Not difficult at all       05/10/2022    1:06 PM 03/28/2022    8:09 AM 01/23/2022   11:10 AM  Depression screen PHQ 2/9  Decreased Interest 0 0 0  Down, Depressed, Hopeless 0 0 0  PHQ - 2 Score 0 0 0  Altered sleeping 0 0 0  Tired, decreased energy 0 0 0  Change in appetite 0 0 0  Feeling bad or failure about yourself  0 0 0  Trouble concentrating 0 0 0  Moving slowly or fidgety/restless 0 0 0  Suicidal thoughts 0 0 0  PHQ-9 Score 0 0 0  Difficult doing work/chores Not difficult at all Not difficult at all Not difficult at all    BP Readings from Last 3 Encounters:  05/10/22 128/74  03/28/22 128/60  02/12/22 132/78    Physical Exam Vitals and nursing note reviewed.  HENT:     Head: Normocephalic.     Right Ear: Tympanic membrane and ear canal normal.     Left Ear: Tympanic membrane and ear canal normal.     Nose: Nose normal.     Mouth/Throat:     Mouth: Mucous membranes are moist.  Eyes:     General:        Right eye: No discharge.        Left eye: No discharge.     Extraocular Movements: Extraocular movements intact.     Pupils: Pupils are equal, round, and reactive to light.  Cardiovascular:     Pulses: Normal pulses.     Heart sounds: No murmur heard.    No friction rub. No gallop.  Pulmonary:     Breath sounds: No wheezing, rhonchi or rales.     Wt Readings from Last 3 Encounters:  05/10/22 292 lb (132.5 kg)  03/28/22 292 lb (132.5 kg)  02/12/22 285 lb (129.3 kg)    BP 128/74   Pulse 60   Temp 98.4 F (36.9 C) (Oral)   Ht '5\' 7"'$  (1.702 m)   Wt 292 lb (132.5 kg)   SpO2 98%   BMI 45.73 kg/m   Assessment and Plan: 1. Acute non-recurrent maxillary sinusitis New onset.   Persistent.  Purulent tinged when patient clears  her sinuses with tenderness over the maxillary sinuses bilateral.  Exam and history is consistent with maxillary sinusitis and will treat with Augmentin 875 mg twice a day. - amoxicillin-clavulanate (AUGMENTIN) 875-125 MG tablet; Take 1 tablet by mouth 2 (two) times daily.  Dispense: 20 tablet; Refill: 0  2. Loss of smell Recent loss of smell even though that she had COVID in October 4 months ago.  Patient had return of smell now has lost smell again.  We will recheck COVID to see if antivirals are necessary.  POCT test for COVID is negative. - POC COVID-19  3. Subacute cough New onset.  Persistent.  Productive of yellowish sputum.  Most likely of a post nasal drainage origin.  We will treat with promethazine DM teaspoon every 6 hours as needed for cough. - promethazine-dextromethorphan (PROMETHAZINE-DM) 6.25-15 MG/5ML syrup; Take 5 mLs by mouth 4 (four) times daily as needed for cough.  Dispense: 118 mL; Refill: 0    Otilio Miu, MD

## 2022-05-10 NOTE — Telephone Encounter (Signed)
Pt stated she saw Dr.Jones and was given an antibiotic; however, she forgot to ask for Monistat. Stated she will need something for a yeast infection.  Pt requesting this be sent to the preferred pharmacy below.  [facility] [place], [place] [address] AT [address]  [place] [address]  Phone: [phone] Fax: [phone]  Hours: Not open 24 hours

## 2022-05-11 ENCOUNTER — Encounter: Payer: Self-pay | Admitting: Family Medicine

## 2022-05-11 ENCOUNTER — Other Ambulatory Visit: Payer: Self-pay

## 2022-05-11 DIAGNOSIS — T3695XA Adverse effect of unspecified systemic antibiotic, initial encounter: Secondary | ICD-10-CM

## 2022-05-11 MED ORDER — FLUCONAZOLE 150 MG PO TABS: 150.0000 mg | ORAL_TABLET | Freq: Once | ORAL | 0 refills | Status: AC

## 2022-06-18 ENCOUNTER — Encounter: Payer: Self-pay | Admitting: Internal Medicine

## 2022-06-19 ENCOUNTER — Other Ambulatory Visit: Payer: Self-pay | Admitting: Internal Medicine

## 2022-07-12 ENCOUNTER — Ambulatory Visit: Payer: BC Managed Care – PPO | Admitting: Internal Medicine

## 2022-07-19 ENCOUNTER — Other Ambulatory Visit: Payer: Self-pay

## 2022-07-19 ENCOUNTER — Encounter: Payer: Self-pay | Admitting: Internal Medicine

## 2022-07-19 DIAGNOSIS — I10 Essential (primary) hypertension: Secondary | ICD-10-CM

## 2022-07-19 MED ORDER — BYSTOLIC 5 MG PO TABS: 5.0000 mg | ORAL_TABLET | Freq: Every day | ORAL | 1 refills | Status: DC

## 2022-08-23 ENCOUNTER — Other Ambulatory Visit: Payer: BC Managed Care – PPO

## 2022-09-11 ENCOUNTER — Other Ambulatory Visit: Payer: Self-pay | Admitting: Internal Medicine

## 2022-09-11 ENCOUNTER — Telehealth: Payer: Self-pay

## 2022-09-11 ENCOUNTER — Ambulatory Visit
Admission: RE | Admit: 2022-09-11 | Discharge: 2022-09-11 | Disposition: A | Discharge status: Home / Self Care | Payer: BC Managed Care – PPO | Source: Ambulatory Visit | Attending: Internal Medicine | Admitting: Internal Medicine

## 2022-09-11 DIAGNOSIS — M85851 Other specified disorders of bone density and structure, right thigh: Secondary | ICD-10-CM | POA: Diagnosis not present

## 2022-09-11 DIAGNOSIS — M85859 Other specified disorders of bone density and structure, unspecified thigh: Secondary | ICD-10-CM | POA: Insufficient documentation

## 2022-09-11 DIAGNOSIS — Z780 Asymptomatic menopausal state: Secondary | ICD-10-CM | POA: Diagnosis not present

## 2022-09-11 NOTE — Telephone Encounter (Signed)
Completed PA for Bystolic by calling patients insurance [employer].  Member ID: [id number]  PA was approved until 09/11/2023.  REF# [id number]  Patient informed.

## 2022-10-02 ENCOUNTER — Ambulatory Visit: Payer: BC Managed Care – PPO | Admitting: Internal Medicine

## 2022-10-02 ENCOUNTER — Encounter: Payer: Self-pay | Admitting: Internal Medicine

## 2022-10-02 VITALS — BP 136/82 | HR 71 | Ht 67.0 in | Wt 286.0 lb

## 2022-10-02 DIAGNOSIS — R7303 Prediabetes: Secondary | ICD-10-CM | POA: Diagnosis not present

## 2022-10-02 DIAGNOSIS — N3000 Acute cystitis without hematuria: Secondary | ICD-10-CM

## 2022-10-02 DIAGNOSIS — I10 Essential (primary) hypertension: Secondary | ICD-10-CM | POA: Diagnosis not present

## 2022-10-02 DIAGNOSIS — E782 Mixed hyperlipidemia: Secondary | ICD-10-CM | POA: Diagnosis not present

## 2022-10-02 LAB — POCT URINALYSIS DIPSTICK
Bilirubin, UA: NEGATIVE
Glucose, UA: NEGATIVE
Ketones, UA: NEGATIVE
Nitrite, UA: NEGATIVE
Protein, UA: NEGATIVE
Spec Grav, UA: 1.01 (ref 1.010–1.025)
Urobilinogen, UA: 0.2 E.U./dL
pH, UA: 5 (ref 5.0–8.0)

## 2022-10-02 MED ORDER — SULFAMETHOXAZOLE-TRIMETHOPRIM 800-160 MG PO TABS: 1.0000 | ORAL_TABLET | Freq: Two times a day (BID) | ORAL | 0 refills | Status: AC

## 2022-10-02 NOTE — Progress Notes (Signed)
Date:  10/02/2022   Name:  Courtney Robinson   DOB:  Apr 06, 1956   MRN:  [id number]   Chief Complaint: Urinary Tract Infection (Fatigue, delirium, urinary frequency, did have foul urine but now the smell is gone per patient, insomnia.)  Urinary Tract Infection  This is a new problem. The current episode started in the past 7 days. The problem occurs every urination. The problem has been unchanged. The quality of the pain is described as aching. The pain is mild. Associated symptoms include frequency and urgency. Pertinent negatives include no chills or discharge.  Hypertension This is a chronic problem. The problem is controlled. Pertinent negatives include no chest pain, headaches, palpitations or shortness of breath. Past treatments include beta blockers.  Diabetes She presents for her follow-up diabetic visit. Diabetes type: prediabetes. Her disease course has been stable. Pertinent negatives for hypoglycemia include no dizziness, headaches or nervousness/anxiousness. Associated symptoms include fatigue. Pertinent negatives for diabetes include no chest pain and no weakness.    Lab Results  Component Value Date   NA 139 03/28/2022   K 4.5 03/28/2022   CO2 22 03/28/2022   GLUCOSE 104 (H) 03/28/2022   BUN 16 03/28/2022   CREATININE 1.12 (H) 03/28/2022   CALCIUM 9.4 03/28/2022   EGFR 54 (L) 03/28/2022   GFRNONAA 57 (L) 12/15/2019   Lab Results  Component Value Date   CHOL 203 (H) 03/28/2022   HDL 61 03/28/2022   LDLCALC 116 (H) 03/28/2022   TRIG 151 (H) 03/28/2022   CHOLHDL 3.3 03/28/2022   Lab Results  Component Value Date   TSH 1.460 03/28/2022   Lab Results  Component Value Date   HGBA1C 6.1 (H) 03/28/2022   Lab Results  Component Value Date   WBC 6.7 03/28/2022   HGB 12.9 03/28/2022   HCT 38.6 03/28/2022   MCV 91 03/28/2022   PLT 247 03/28/2022   Lab Results  Component Value Date   ALT 11 03/28/2022   AST 17 03/28/2022   ALKPHOS 82 03/28/2022    BILITOT 0.3 03/28/2022   Lab Results  Component Value Date   VD25OH 18.6 (L) 03/28/2022     Review of Systems  Constitutional:  Positive for fatigue. Negative for chills and unexpected weight change.  HENT:  Negative for nosebleeds.   Eyes:  Negative for visual disturbance.  Respiratory:  Negative for cough, chest tightness, shortness of breath and wheezing.   Cardiovascular:  Negative for chest pain, palpitations and leg swelling.  Gastrointestinal:  Negative for abdominal pain, constipation and diarrhea.  Genitourinary:  Positive for frequency and urgency.  Neurological:  Negative for dizziness, weakness, light-headedness and headaches.  Psychiatric/Behavioral:  Positive for sleep disturbance. Negative for dysphoric mood. The patient is not nervous/anxious.     Patient Active Problem List   Diagnosis Date Noted   Osteopenia 03/28/2022   HSV (herpes simplex virus) infection 12/15/2019   Rectocele 07/27/2019   Arthralgia of left temporomandibular joint 10/15/2017   Pre-diabetes 08/16/2017   Environmental and seasonal allergies 07/20/2016   Blepharospasm 07/10/2016   Left knee pain 08/12/2015   History of palpitations 08/12/2015   Essential (primary) hypertension 01/17/2015   Arthralgia of hand 01/17/2015   Adult BMI 40.0-44.9 kg/sq m (HCC) 12/30/2009   Mixed hyperlipidemia 03/25/2009    Allergies  Allergen Reactions   Hydrochlorothiazide Shortness Of Breath, Swelling and Other (See Comments)    Patient states throat swelled up, felt SOB, chest pain, and tingling in arms and shoulders.  Amlodipine    Hydrocodone    Codeine Nausea And Vomiting   Lisinopril Cough   Loratadine Palpitations   Metoprolol Other (See Comments)    bradycardia   Olmesartan Rash    Past Surgical History:  Procedure Laterality Date   CHOLECYSTECTOMY  2013   ENDOMETRIAL BIOPSY  11/16/09   Disordered Proliferative Endometrium, negative for hyperplasia, atypia or malignancy    Social History    Tobacco Use   Smoking status: Never   Smokeless tobacco: Never  Vaping Use   Vaping Use: Never used  Substance Use Topics   Alcohol use: No    Alcohol/week: 0.0 standard drinks of alcohol   Drug use: No     Medication list has been reviewed and updated.  Current Meds  Medication Sig   Ascorbic Acid (VITAMIN C) 1000 MG tablet Take 1,000 mg by mouth daily.   BYSTOLIC 5 MG tablet Take 1 tablet (5 mg total) by mouth daily.   D-Mannose POWD Take by mouth 3 (three) times a week.   sulfamethoxazole-trimethoprim (BACTRIM DS) 800-160 MG tablet Take 1 tablet by mouth 2 (two) times daily for 10 days.   UNABLE TO FIND A-F Betafood   valACYclovir (VALTREX) 1000 MG tablet Take 1 tablet (1,000 mg total) by mouth 2 (two) times daily. 2 tabs every 12 hours for 2 doses as needed   Vitamin D, Ergocalciferol, (DRISDOL) 1.25 MG (50000 UNIT) CAPS capsule TAKE 1 CAPSULE BY MOUTH EVERY 7 DAYS       10/02/2022    8:08 AM 05/10/2022    1:07 PM 03/28/2022    8:10 AM 01/23/2022   11:10 AM  GAD 7 : Generalized Anxiety Score  Nervous, Anxious, on Edge 0 0 0 0  Control/stop worrying 0 0 0 0  Worry too much - different things 0 0 0 0  Trouble relaxing 0 0 0 0  Restless 0 0 0 0  Easily annoyed or irritable 0 0 0 0  Afraid - awful might happen 0 0 0 0  Total GAD 7 Score 0 0 0 0  Anxiety Difficulty Not difficult at all Not difficult at all Not difficult at all Not difficult at all       10/02/2022    8:07 AM 05/10/2022    1:06 PM 03/28/2022    8:09 AM  Depression screen PHQ 2/9  Decreased Interest 0 0 0  Down, Depressed, Hopeless 0 0 0  PHQ - 2 Score 0 0 0  Altered sleeping 0 0 0  Tired, decreased energy 0 0 0  Change in appetite 0 0 0  Feeling bad or failure about yourself  0 0 0  Trouble concentrating 0 0 0  Moving slowly or fidgety/restless 0 0 0  Suicidal thoughts 0 0 0  PHQ-9 Score 0 0 0  Difficult doing work/chores Not difficult at all Not difficult at all Not difficult at all    BP  Readings from Last 3 Encounters:  10/02/22 136/82  05/10/22 128/74  03/28/22 128/60    Physical Exam Vitals and nursing note reviewed.  Constitutional:      Appearance: She is well-developed.  Cardiovascular:     Rate and Rhythm: Normal rate and regular rhythm.     Heart sounds: Normal heart sounds.  Pulmonary:     Effort: Pulmonary effort is normal. No respiratory distress.     Breath sounds: Normal breath sounds.  Abdominal:     General: Bowel sounds are normal.  Palpations: Abdomen is soft.     Tenderness: There is abdominal tenderness in the suprapubic area. There is no right CVA tenderness, left CVA tenderness, guarding or rebound.  Skin:    Findings: No lesion or rash.  Neurological:     General: No focal deficit present.     Wt Readings from Last 3 Encounters:  10/02/22 286 lb (129.7 kg)  05/10/22 292 lb (132.5 kg)  03/28/22 292 lb (132.5 kg)    BP 136/82 (BP Location: Right Arm, Cuff Size: Large)   Pulse 71   Ht 5\' 7"  (1.702 m)   Wt 286 lb (129.7 kg)   SpO2 98%   BMI 44.79 kg/m   Assessment and Plan:  Problem List Items Addressed This Visit     Pre-diabetes (Chronic)    BS controlled with diet changes. Lab Results  Component Value Date   HGBA1C 6.1 (H) 03/28/2022        Relevant Orders   Hemoglobin A1c   Mixed hyperlipidemia (Chronic)   Relevant Orders   Lipid panel   Essential (primary) hypertension (Chronic)    Stable exam with well controlled BP.  Currently taking bystolic. Tolerating medications without concerns or side effects. Will continue to recommend low sodium diet and current regimen.       Relevant Orders   Comprehensive metabolic panel   CBC with Differential/Platelet   Other Visit Diagnoses     Acute cystitis without hematuria    -  Primary   continue to push fluids treat with Bactrim; check Urine Cx   Relevant Medications   sulfamethoxazole-trimethoprim (BACTRIM DS) 800-160 MG tablet       No follow-ups on file.    Partially dictated using Dragon software, any errors are not intentional.  Reubin Milan, MD [facility] [place], [place]

## 2022-10-02 NOTE — Assessment & Plan Note (Signed)
Stable exam with well controlled BP.  Currently taking bystolic. Tolerating medications without concerns or side effects. Will continue to recommend low sodium diet and current regimen.

## 2022-10-02 NOTE — Addendum Note (Signed)
Addended by: Mariel Sleet on: 10/02/2022 08:53 AM   Modules accepted: Orders

## 2022-10-02 NOTE — Assessment & Plan Note (Signed)
BS controlled with diet changes. Lab Results  Component Value Date   HGBA1C 6.1 (H) 03/28/2022

## 2022-10-03 LAB — HEMOGLOBIN A1C
Est. average glucose Bld gHb Est-mCnc: 128 mg/dL
Hgb A1c MFr Bld: 6.1 % — ABNORMAL HIGH (ref 4.8–5.6)

## 2022-10-03 LAB — COMPREHENSIVE METABOLIC PANEL
ALT: 15 IU/L (ref 0–32)
AST: 18 IU/L (ref 0–40)
Albumin: 4.3 g/dL (ref 3.9–4.9)
Alkaline Phosphatase: 86 IU/L (ref 44–121)
BUN/Creatinine Ratio: 20 (ref 12–28)
BUN: 20 mg/dL (ref 8–27)
Bilirubin Total: 0.4 mg/dL (ref 0.0–1.2)
CO2: 20 mmol/L (ref 20–29)
Calcium: 9.9 mg/dL (ref 8.7–10.3)
Chloride: 106 mmol/L (ref 96–106)
Creatinine, Ser: 1 mg/dL (ref 0.57–1.00)
Globulin, Total: 3.6 g/dL (ref 1.5–4.5)
Glucose: 109 mg/dL — ABNORMAL HIGH (ref 70–99)
Potassium: 4.7 mmol/L (ref 3.5–5.2)
Sodium: 141 mmol/L (ref 134–144)
Total Protein: 7.9 g/dL (ref 6.0–8.5)
eGFR: 62 mL/min/{1.73_m2} (ref >59)

## 2022-10-03 LAB — CBC WITH DIFFERENTIAL/PLATELET
Basophils Absolute: 0.1 10*3/uL (ref 0.0–0.2)
Basos: 1 %
EOS (ABSOLUTE): 0.2 10*3/uL (ref 0.0–0.4)
Eos: 2 %
Hematocrit: 41.4 % (ref 34.0–46.6)
Hemoglobin: 14 g/dL (ref 11.1–15.9)
Immature Grans (Abs): 0 10*3/uL (ref 0.0–0.1)
Immature Granulocytes: 0 %
Lymphocytes Absolute: 2 10*3/uL (ref 0.7–3.1)
Lymphs: 27 %
MCH: 30.3 pg (ref 26.6–33.0)
MCHC: 33.8 g/dL (ref 31.5–35.7)
MCV: 90 fL (ref 79–97)
Monocytes Absolute: 0.7 10*3/uL (ref 0.1–0.9)
Monocytes: 9 %
Neutrophils Absolute: 4.6 10*3/uL (ref 1.4–7.0)
Neutrophils: 61 %
Platelets: 237 10*3/uL (ref 150–450)
RBC: 4.62 x10E6/uL (ref 3.77–5.28)
RDW: 13.2 % (ref 11.7–15.4)
WBC: 7.6 10*3/uL (ref 3.4–10.8)

## 2022-10-03 LAB — LIPID PANEL
Chol/HDL Ratio: 3.9 ratio (ref 0.0–4.4)
Cholesterol, Total: 204 mg/dL — ABNORMAL HIGH (ref 100–199)
HDL: 52 mg/dL (ref >39)
LDL Chol Calc (NIH): 122 mg/dL — ABNORMAL HIGH (ref 0–99)
Triglycerides: 173 mg/dL — ABNORMAL HIGH (ref 0–149)
VLDL Cholesterol Cal: 30 mg/dL (ref 5–40)

## 2022-10-04 LAB — URINE CULTURE

## 2022-10-05 ENCOUNTER — Telehealth: Payer: Self-pay | Admitting: Internal Medicine

## 2022-10-05 NOTE — Telephone Encounter (Signed)
Pt. Calling for Urine culture, not reviewed by PCP yet. Pt. States she is feeling better.

## 2022-12-05 ENCOUNTER — Encounter: Payer: Self-pay | Admitting: Internal Medicine

## 2022-12-05 ENCOUNTER — Ambulatory Visit: Payer: BC Managed Care – PPO | Admitting: Internal Medicine

## 2022-12-05 VITALS — BP 129/72 | HR 72 | Ht 67.0 in | Wt 285.0 lb

## 2022-12-05 DIAGNOSIS — M26622 Arthralgia of left temporomandibular joint: Secondary | ICD-10-CM

## 2022-12-05 MED ORDER — CYCLOBENZAPRINE HCL 10 MG PO TABS: 10.0000 mg | ORAL_TABLET | Freq: Every day | ORAL | 0 refills | Status: AC

## 2022-12-05 NOTE — Progress Notes (Signed)
Date:  12/05/2022   Name:  Courtney Robinson   DOB:  1955/11/30   MRN:  [id number]   Chief Complaint: Ear Pain (Pain on and off in both ears. ) and Temporomandibular Joint Pain (Patient see's PT, and does massage for this. In the last 6 weeks, it has gotten worse.)  Otalgia  There is pain in both ears. This is a recurrent problem. The problem has been unchanged. There has been no fever. The pain is mild. Pertinent negatives include no coughing.   TMJ - worse recently, esp the left side.  Had been managed with PT and massage.  Symptoms are now some numbness on the left side but with occasional shooting pain down to the left side of her tongue.  She has not eaten any hard, sticky or chewy foods.  She has not seen her dentist.  Lab Results  Component Value Date   NA 141 10/02/2022   K 4.7 10/02/2022   CO2 20 10/02/2022   GLUCOSE 109 (H) 10/02/2022   BUN 20 10/02/2022   CREATININE 1.00 10/02/2022   CALCIUM 9.9 10/02/2022   EGFR 62 10/02/2022   GFRNONAA 57 (L) 12/15/2019   Lab Results  Component Value Date   CHOL 204 (H) 10/02/2022   HDL 52 10/02/2022   LDLCALC 122 (H) 10/02/2022   TRIG 173 (H) 10/02/2022   CHOLHDL 3.9 10/02/2022   Lab Results  Component Value Date   TSH 1.460 03/28/2022   Lab Results  Component Value Date   HGBA1C 6.1 (H) 10/02/2022   Lab Results  Component Value Date   WBC 7.6 10/02/2022   HGB 14.0 10/02/2022   HCT 41.4 10/02/2022   MCV 90 10/02/2022   PLT 237 10/02/2022   Lab Results  Component Value Date   ALT 15 10/02/2022   AST 18 10/02/2022   ALKPHOS 86 10/02/2022   BILITOT 0.4 10/02/2022   Lab Results  Component Value Date   VD25OH 18.6 (L) 03/28/2022     Review of Systems  Constitutional:  Negative for appetite change, diaphoresis and fever.  HENT:  Positive for ear pain. Negative for trouble swallowing.   Respiratory:  Negative for cough, chest tightness and shortness of breath.   Cardiovascular:  Negative for chest pain.   Musculoskeletal:  Positive for arthralgias (left sided jaw pain radiating to her tongue).  Psychiatric/Behavioral:  Negative for dysphoric mood and sleep disturbance. The patient is not nervous/anxious.     Patient Active Problem List   Diagnosis Date Noted   Osteopenia 03/28/2022   HSV (herpes simplex virus) infection 12/15/2019   Rectocele 07/27/2019   Arthralgia of left temporomandibular joint 10/15/2017   Pre-diabetes 08/16/2017   Environmental and seasonal allergies 07/20/2016   Blepharospasm 07/10/2016   Left knee pain 08/12/2015   History of palpitations 08/12/2015   Essential (primary) hypertension 01/17/2015   Arthralgia of hand 01/17/2015   Adult BMI 40.0-44.9 kg/sq m (HCC) 12/30/2009   Mixed hyperlipidemia 03/25/2009    Allergies  Allergen Reactions   Hydrochlorothiazide Shortness Of Breath, Swelling and Other (See Comments)    Patient states throat swelled up, felt SOB, chest pain, and tingling in arms and shoulders.   Amlodipine    Hydrocodone    Codeine Nausea And Vomiting   Lisinopril Cough   Loratadine Palpitations   Metoprolol Other (See Comments)    bradycardia   Olmesartan Rash    Past Surgical History:  Procedure Laterality Date   CHOLECYSTECTOMY  2013  ENDOMETRIAL BIOPSY  11/16/09   Disordered Proliferative Endometrium, negative for hyperplasia, atypia or malignancy    Social History   Tobacco Use   Smoking status: Never   Smokeless tobacco: Never  Vaping Use   Vaping status: Never Used  Substance Use Topics   Alcohol use: No    Alcohol/week: 0.0 standard drinks of alcohol   Drug use: No     Medication list has been reviewed and updated.  Current Meds  Medication Sig   cyclobenzaprine (FLEXERIL) 10 MG tablet Take 1 tablet (10 mg total) by mouth at bedtime. As needed for jaw pain       12/05/2022    3:52 PM 10/02/2022    8:08 AM 05/10/2022    1:07 PM 03/28/2022    8:10 AM  GAD 7 : Generalized Anxiety Score  Nervous, Anxious, on  Edge 0 0 0 0  Control/stop worrying 0 0 0 0  Worry too much - different things 0 0 0 0  Trouble relaxing 0 0 0 0  Restless 0 0 0 0  Easily annoyed or irritable 0 0 0 0  Afraid - awful might happen 0 0 0 0  Total GAD 7 Score 0 0 0 0  Anxiety Difficulty Not difficult at all Not difficult at all Not difficult at all Not difficult at all       12/05/2022    3:52 PM 10/02/2022    8:07 AM 05/10/2022    1:06 PM  Depression screen PHQ 2/9  Decreased Interest 0 0 0  Down, Depressed, Hopeless 0 0 0  PHQ - 2 Score 0 0 0  Altered sleeping 0 0 0  Tired, decreased energy 0 0 0  Change in appetite 0 0 0  Feeling bad or failure about yourself  0 0 0  Trouble concentrating 0 0 0  Moving slowly or fidgety/restless 0 0 0  Suicidal thoughts 0 0 0  PHQ-9 Score 0 0 0  Difficult doing work/chores Not difficult at all Not difficult at all Not difficult at all    BP Readings from Last 3 Encounters:  12/05/22 129/72  10/02/22 136/82  05/10/22 128/74    Physical Exam Vitals and nursing note reviewed.  Constitutional:      General: She is not in acute distress.    Appearance: Normal appearance. She is well-developed.  HENT:     Head: Normocephalic and atraumatic.     Jaw: Tenderness and pain on movement present.     Right Ear: Tympanic membrane and ear canal normal.     Left Ear: Tympanic membrane and ear canal normal.     Nose:     Right Sinus: No maxillary sinus tenderness.     Left Sinus: No maxillary sinus tenderness.  Cardiovascular:     Rate and Rhythm: Normal rate and regular rhythm.  Pulmonary:     Effort: Pulmonary effort is normal. No respiratory distress.     Breath sounds: No wheezing or rhonchi.  Skin:    General: Skin is warm and dry.     Findings: No rash.  Neurological:     Mental Status: She is alert and oriented to person, place, and time.  Psychiatric:        Mood and Affect: Mood normal.        Behavior: Behavior normal.     Wt Readings from Last 3 Encounters:   12/05/22 285 lb (129.3 kg)  10/02/22 286 lb (129.7 kg)  05/10/22 292 lb (132.5  kg)    BP 129/72 (BP Location: Left Arm, Cuff Size: Large)   Pulse 72   Ht 5\' 7"  (1.702 m)   Wt 285 lb (129.3 kg)   SpO2 97%   BMI 44.64 kg/m   Assessment and Plan:  Problem List Items Addressed This Visit       Unprioritized   Arthralgia of left temporomandibular joint - Primary    Recent flare of jaw discomfort now radiating into the ears Continue acu-puncture and massage and ice Recommend Advil bid and Flexeril at HS      Relevant Medications   cyclobenzaprine (FLEXERIL) 10 MG tablet    No follow-ups on file.    Reubin Milan, MD [facility] [place]

## 2022-12-05 NOTE — Assessment & Plan Note (Signed)
Recent flare of jaw discomfort now radiating into the ears Continue acu-puncture and massage and ice Recommend Advil bid and Flexeril at HS

## 2022-12-11 DIAGNOSIS — H9203 Otalgia, bilateral: Secondary | ICD-10-CM | POA: Diagnosis not present

## 2022-12-11 DIAGNOSIS — M2669 Other specified disorders of temporomandibular joint: Secondary | ICD-10-CM | POA: Diagnosis not present

## 2022-12-16 ENCOUNTER — Other Ambulatory Visit: Payer: Self-pay | Admitting: Internal Medicine

## 2022-12-16 DIAGNOSIS — I10 Essential (primary) hypertension: Secondary | ICD-10-CM

## 2022-12-17 ENCOUNTER — Other Ambulatory Visit: Payer: Self-pay | Admitting: Internal Medicine

## 2022-12-17 DIAGNOSIS — Z1231 Encounter for screening mammogram for malignant neoplasm of breast: Secondary | ICD-10-CM

## 2023-01-02 DIAGNOSIS — L918 Other hypertrophic disorders of the skin: Secondary | ICD-10-CM | POA: Diagnosis not present

## 2023-01-02 DIAGNOSIS — L249 Irritant contact dermatitis, unspecified cause: Secondary | ICD-10-CM | POA: Diagnosis not present

## 2023-01-02 DIAGNOSIS — L738 Other specified follicular disorders: Secondary | ICD-10-CM | POA: Diagnosis not present

## 2023-01-07 ENCOUNTER — Encounter: Payer: BC Managed Care – PPO | Admitting: Internal Medicine

## 2023-01-10 ENCOUNTER — Ambulatory Visit
Admission: RE | Admit: 2023-01-10 | Discharge: 2023-01-10 | Disposition: A | Discharge status: Home / Self Care | Payer: BC Managed Care – PPO | Source: Ambulatory Visit | Attending: Internal Medicine | Admitting: Internal Medicine

## 2023-01-10 DIAGNOSIS — Z1231 Encounter for screening mammogram for malignant neoplasm of breast: Secondary | ICD-10-CM | POA: Insufficient documentation

## 2023-01-17 DIAGNOSIS — L249 Irritant contact dermatitis, unspecified cause: Secondary | ICD-10-CM | POA: Diagnosis not present

## 2023-02-01 ENCOUNTER — Ambulatory Visit (INDEPENDENT_AMBULATORY_CARE_PROVIDER_SITE_OTHER): Payer: BC Managed Care – PPO

## 2023-02-01 DIAGNOSIS — Z23 Encounter for immunization: Secondary | ICD-10-CM

## 2023-02-14 DIAGNOSIS — R293 Abnormal posture: Secondary | ICD-10-CM | POA: Diagnosis not present

## 2023-02-14 DIAGNOSIS — M26602 Left temporomandibular joint disorder, unspecified: Secondary | ICD-10-CM | POA: Diagnosis not present

## 2023-02-19 DIAGNOSIS — Z885 Allergy status to narcotic agent status: Secondary | ICD-10-CM | POA: Diagnosis not present

## 2023-02-19 DIAGNOSIS — R197 Diarrhea, unspecified: Secondary | ICD-10-CM | POA: Diagnosis not present

## 2023-02-19 DIAGNOSIS — N3000 Acute cystitis without hematuria: Secondary | ICD-10-CM | POA: Diagnosis not present

## 2023-02-19 DIAGNOSIS — Z888 Allergy status to other drugs, medicaments and biological substances status: Secondary | ICD-10-CM | POA: Diagnosis not present

## 2023-02-19 DIAGNOSIS — Z79899 Other long term (current) drug therapy: Secondary | ICD-10-CM | POA: Diagnosis not present

## 2023-02-19 DIAGNOSIS — K529 Noninfective gastroenteritis and colitis, unspecified: Secondary | ICD-10-CM | POA: Diagnosis not present

## 2023-02-19 DIAGNOSIS — I10 Essential (primary) hypertension: Secondary | ICD-10-CM | POA: Diagnosis not present

## 2023-02-21 ENCOUNTER — Encounter: Payer: Self-pay | Admitting: Emergency Medicine

## 2023-02-21 ENCOUNTER — Ambulatory Visit
Admission: EM | Admit: 2023-02-21 | Discharge: 2023-02-21 | Disposition: A | Discharge status: Home / Self Care | Payer: BC Managed Care – PPO

## 2023-02-21 DIAGNOSIS — N3000 Acute cystitis without hematuria: Secondary | ICD-10-CM

## 2023-02-21 DIAGNOSIS — T7840XA Allergy, unspecified, initial encounter: Secondary | ICD-10-CM | POA: Diagnosis not present

## 2023-02-21 MED ORDER — DEXAMETHASONE SODIUM PHOSPHATE 10 MG/ML IJ SOLN
10.0000 mg | Freq: Once | INTRAMUSCULAR | Status: AC
  Administered 2023-02-21: 10 mg via INTRAMUSCULAR

## 2023-02-21 MED ORDER — SULFAMETHOXAZOLE-TRIMETHOPRIM 800-160 MG PO TABS: 1.0000 | ORAL_TABLET | Freq: Two times a day (BID) | ORAL | 0 refills | Status: AC

## 2023-02-21 MED ORDER — DIPHENHYDRAMINE HCL 50 MG/ML IJ SOLN
50.0000 mg | Freq: Once | INTRAMUSCULAR | Status: AC
  Administered 2023-02-21: 50 mg via INTRAMUSCULAR

## 2023-02-21 MED ORDER — PREDNISONE 20 MG PO TABS
40.0000 mg | ORAL_TABLET | Freq: Every day | ORAL | 0 refills | Status: AC
Start: 2023-02-21 — End: 2023-02-24

## 2023-02-21 NOTE — ED Provider Notes (Signed)
[facility]    CSN: [id number] Arrival date & time: 02/21/23  1925      History   Chief Complaint Chief Complaint  Patient presents with   Shortness of Breath   Oral Swelling    HPI Courtney Robinson is a 67 y.o. female presenting for 7-hour history of lip swelling/tingling and numbness as well as shortness of breath.  Patient reports that she has been on Macrobid and has taken a total of 4 pills over the past couple of days.  She was prescribed this in the emergency department for a UTI which developed after a GI illness.  She does not believe she is ever taken this medication before.  The last dose this medication was at 10 AM.  She denies rashes, throat swelling, difficulty swallowing, wheezing, chest tightness.  She is not really feeling short of breath at this time.  She has not taken any medications including antihistamines.  Has a long list of allergies, mostly to blood pressure medications.  Of note, symptoms have not acutely worsened.  HPI  Past Medical History:  Diagnosis Date   Acute bacterial rhinosinusitis 02/08/2022   Allergy    Anemia    Displacement of intervertebral disc, site unspecified, without myelopathy    Fibroadenoma of breast    right   History of Lyme disease 2006/2007   Spinal stenosis, lumbar region, without neurogenic claudication    Vertigo     Patient Active Problem List   Diagnosis Date Noted   Osteopenia 03/28/2022   HSV (herpes simplex virus) infection 12/15/2019   Rectocele 07/27/2019   Arthralgia of left temporomandibular joint 10/15/2017   Pre-diabetes 08/16/2017   Environmental and seasonal allergies 07/20/2016   Blepharospasm 07/10/2016   Left knee pain 08/12/2015   History of palpitations 08/12/2015   Essential (primary) hypertension 01/17/2015   Arthralgia of hand 01/17/2015   Adult BMI 40.0-44.9 kg/sq m (HCC) 12/30/2009   Mixed hyperlipidemia 03/25/2009    Past Surgical History:  Procedure Laterality  Date   CHOLECYSTECTOMY  2013   ENDOMETRIAL BIOPSY  11/16/09   Disordered Proliferative Endometrium, negative for hyperplasia, atypia or malignancy    OB History   No obstetric history on file.      Home Medications    Prior to Admission medications   Medication Sig Start Date End Date Taking? Authorizing Provider  Ascorbic Acid (VITAMIN C) 1000 MG tablet Take 1,000 mg by mouth daily.   Yes [provider]  BYSTOLIC 5 MG tablet TAKE 1 TABLET(5 MG) BY MOUTH DAILY 12/17/22  Yes Reubin Milan, MD  dicyclomine (BENTYL) 20 MG tablet Take by mouth. 02/19/23 03/01/23 Yes [provider]  nitrofurantoin, macrocrystal-monohydrate, (MACROBID) 100 MG capsule Take by mouth. 02/19/23 02/26/23 Yes [provider]  ondansetron (ZOFRAN-ODT) 4 MG disintegrating tablet Take by mouth. 02/19/23 03/01/23 Yes [provider]  predniSONE (DELTASONE) 20 MG tablet Take 2 tablets (40 mg total) by mouth daily for 3 days. 02/21/23 02/24/23 Yes Eusebio Friendly B, PA-C  sulfamethoxazole-trimethoprim (BACTRIM DS) 800-160 MG tablet Take 1 tablet by mouth 2 (two) times daily for 5 days. 02/21/23 02/26/23 Yes Eusebio Friendly B, PA-C  Vitamin D, Ergocalciferol, (DRISDOL) 1.25 MG (50000 UNIT) CAPS capsule TAKE 1 CAPSULE BY MOUTH EVERY 7 DAYS 09/11/22  Yes Reubin Milan, MD  cyclobenzaprine (FLEXERIL) 10 MG tablet Take 1 tablet (10 mg total) by mouth at bedtime. As needed for jaw pain 12/05/22   Reubin Milan, MD  D-Mannose POWD  Take by mouth 3 (three) times a week.    [provider]  UNABLE TO FIND A-F Betafood    [provider]  valACYclovir (VALTREX) 1000 MG tablet Take 1 tablet (1,000 mg total) by mouth 2 (two) times daily. 2 tabs every 12 hours for 2 doses as needed 12/15/19   Reubin Milan, MD    Family History Family History  Problem Relation Age of Onset   Breast cancer Sister    Diabetes Mother    Cancer Mother        pancreatic   Hypertension  Mother    Cancer Sister        breast   Diabetes Sister    Asthma Sister     Social History Social History   Tobacco Use   Smoking status: Never   Smokeless tobacco: Never  Vaping Use   Vaping status: Never Used  Substance Use Topics   Alcohol use: No    Alcohol/week: 0.0 standard drinks of alcohol   Drug use: No     Allergies   Hydrochlorothiazide, Amlodipine, Hydrocodone, Macrobid [nitrofurantoin], Codeine, Lisinopril, Loratadine, Metoprolol, and Olmesartan   Review of Systems Review of Systems  Constitutional:  Negative for fatigue.  HENT:  Negative for congestion, facial swelling, rhinorrhea, trouble swallowing and voice change.   Respiratory:  Positive for chest tightness and shortness of breath. Negative for cough and wheezing.   Cardiovascular:  Negative for chest pain and leg swelling.  Gastrointestinal:  Negative for nausea and vomiting.  Musculoskeletal:  Negative for joint swelling.  Skin:  Negative for rash.  Neurological:  Negative for dizziness and headaches.     Physical Exam Triage Vital Signs ED Triage Vitals  Encounter Vitals Group     BP 02/21/23 1940 (!) 169/73     Systolic BP Percentile --      Diastolic BP Percentile --      Pulse Rate 02/21/23 1940 75     Resp 02/21/23 1940 (!) 28     Temp 02/21/23 1940 98.3 F (36.8 C)     Temp Source 02/21/23 1940 Oral     SpO2 02/21/23 1940 98 %     Weight --      Height --      Head Circumference --      Peak Flow --      Pain Score 02/21/23 1938 0     Pain Loc --      Pain Education --      Exclude from Growth Chart --    No data found.  Updated Vital Signs BP (!) 169/73 (BP Location: Left Arm)   Pulse 75   Temp 98.3 F (36.8 C) (Oral)   Resp (!) 28   SpO2 98%    Physical Exam Vitals and nursing note reviewed.  Constitutional:      General: She is not in acute distress.    Appearance: Normal appearance. She is well-developed. She is not ill-appearing or toxic-appearing.  HENT:      Head: Normocephalic and atraumatic.     Nose: Nose normal.     Mouth/Throat:     Mouth: Mucous membranes are moist.     Pharynx: Oropharynx is clear.     Comments: No appreciable swelling of lips, tongue or intraoral.  No facial swelling. Eyes:     General: No scleral icterus.       Right eye: No discharge.        Left eye: No discharge.  Conjunctiva/sclera: Conjunctivae normal.  Cardiovascular:     Rate and Rhythm: Normal rate and regular rhythm.     Heart sounds: Normal heart sounds.  Pulmonary:     Effort: Pulmonary effort is normal. No respiratory distress.     Breath sounds: Normal breath sounds. No wheezing, rhonchi or rales.  Musculoskeletal:     Cervical back: Neck supple.  Skin:    General: Skin is dry.     Findings: No rash.  Neurological:     General: No focal deficit present.     Mental Status: She is alert. Mental status is at baseline.     Motor: No weakness.     Gait: Gait normal.  Psychiatric:        Mood and Affect: Mood normal.        Behavior: Behavior normal.      UC Treatments / Results  Labs (all labs ordered are listed, but only abnormal results are displayed) Labs Reviewed - No data to display  EKG   Radiology No results found.  Procedures Procedures (including critical care time)  Medications Ordered in UC Medications  diphenhydrAMINE (BENADRYL) injection 50 mg (50 mg Intramuscular Given 02/21/23 2010)  dexamethasone (DECADRON) injection 10 mg (10 mg Intramuscular Given 02/21/23 2012)    Initial Impression / Assessment and Plan / UC Course  I have reviewed the triage vital signs and the nursing notes.  Pertinent labs & imaging results that were available during my care of the patient were reviewed by me and considered in my medical decision making (see chart for details).   67 year old female presents for 7-hour history of lip tingling/numbness and swelling as well as shortness of breath.  Patient believes it is due to her  Macrobid medication.  She has been on this for the past couple days for UTI.  She does not believe she is ever taken this medicine before.  Cannot identify any other potential triggers.  Has not taken any antihistamines.  Denies rashes, difficulty swallowing, wheezing.  She is afebrile and overall well-appearing.  She is in no acute distress.  Normal respiratory rate.  No facial or intraoral swelling.  Throat is clear.  Chest clear to auscultation and heart regular rate and rhythm.  Suspect patient is having a allergic reaction to Macrobid.  Doubt this is anaphylaxis as her symptoms have been ongoing for many hours and have not acutely worsened.  Discussed antihistamines and corticosteroids.  Patient given 50 mg IM Benadryl and 10 mg IM dexamethasone in clinic.  Her husband will drive her home.  To continue antihistamines for the next couple of days as well as start prednisone tomorrow.  Advised to discontinue the Macrobid.  I listed as an allergy.  Sent Bactrim which she has had a few months ago for UTI.  Reviewed ED precautions.   Final Clinical Impressions(s) / UC Diagnoses   Final diagnoses:  Allergic reaction, initial encounter  Acute cystitis without hematuria     Discharge Instructions      -I have listed Macrobid as an allergy.  Do not take this medicine again. - We gave you an injection of Benadryl and dexamethasone steroid in the clinic tonight for allergic reaction. - May need to continue 25 to 50 mg of Benadryl every 6 hours until the symptoms fully resolved, probably a couple more days. - Start prednisone tomorrow and take it for the next 3 days. - Discard Macrobid.  Start Bactrim DS to finish off treatment for your UTI. -  May apply ice to your lips to help with swelling. - If you feel that your symptoms are worsening: Increased swelling in your lips, swelling of the tongue, throat tightness, difficulty swallowing, chest tightness, increased shortness of breath, weakness, feeling  faint, passing out, vomiting, etc. you should call 911 immediately or have your husband taken to the ER.     ED Prescriptions     Medication Sig Dispense Auth. Provider   predniSONE (DELTASONE) 20 MG tablet Take 2 tablets (40 mg total) by mouth daily for 3 days. 6 tablet Eusebio Friendly B, PA-C   sulfamethoxazole-trimethoprim (BACTRIM DS) 800-160 MG tablet Take 1 tablet by mouth 2 (two) times daily for 5 days. 10 tablet Gareth Morgan      PDMP not reviewed this encounter.   Shirlee Latch, PA-C 02/21/23 2023

## 2023-02-21 NOTE — ED Triage Notes (Signed)
Pt presents with SOB and lip swelling and numbness that started around 1pm today. Pt was seen in the ED on 11/12 and was prescribed macrobid for a UTI. She started taking the medication on 11/12 in the evening and has taken a total of 4 pills.

## 2023-02-21 NOTE — Discharge Instructions (Signed)
-  I have listed Macrobid as an allergy.  Do not take this medicine again. - We gave you an injection of Benadryl and dexamethasone steroid in the clinic tonight for allergic reaction. - May need to continue 25 to 50 mg of Benadryl every 6 hours until the symptoms fully resolved, probably a couple more days. - Start prednisone tomorrow and take it for the next 3 days. - Discard Macrobid.  Start Bactrim DS to finish off treatment for your UTI. - May apply ice to your lips to help with swelling. - If you feel that your symptoms are worsening: Increased swelling in your lips, swelling of the tongue, throat tightness, difficulty swallowing, chest tightness, increased shortness of breath, weakness, feeling faint, passing out, vomiting, etc. you should call 911 immediately or have your husband taken to the ER.

## 2023-02-25 ENCOUNTER — Ambulatory Visit: Payer: BC Managed Care – PPO | Admitting: Internal Medicine

## 2023-02-25 ENCOUNTER — Encounter: Payer: Self-pay | Admitting: Internal Medicine

## 2023-02-25 VITALS — BP 135/76 | HR 62 | Ht 67.0 in | Wt 294.0 lb

## 2023-02-25 DIAGNOSIS — T3695XA Adverse effect of unspecified systemic antibiotic, initial encounter: Secondary | ICD-10-CM | POA: Diagnosis not present

## 2023-02-25 DIAGNOSIS — A0811 Acute gastroenteropathy due to Norwalk agent: Secondary | ICD-10-CM | POA: Diagnosis not present

## 2023-02-25 DIAGNOSIS — N3000 Acute cystitis without hematuria: Secondary | ICD-10-CM

## 2023-02-25 NOTE — Progress Notes (Signed)
Date:  02/25/2023   Name:  Courtney Robinson   DOB:  Courtney 18, 1957   MRN:  [id number]   Chief Complaint: Follow-up  Urinary Tract Infection  This is a new problem. The current episode started in the past 7 days. The problem occurs every urination. The problem has been gradually improving. The pain is mild. There has been no fever. Pertinent negatives include no chills, hematuria or nausea. She has tried antibiotics for the symptoms. The treatment provided moderate relief.   Diarrheal illness - after eating at a restaurant.  Went to UC but only got a small amount of IVF.  Stool test + Norovirus.  She has been pushing fluids and now stools are returning to normal.  Lip tingling and numbness - started after three doses of Macrobid.  Now discontinued, treated with benadryl and steroids with complete resolution.   Review of Systems  Constitutional:  Negative for chills, fatigue and fever.  HENT:  Negative for trouble swallowing.   Respiratory:  Negative for chest tightness and shortness of breath.   Cardiovascular:  Negative for chest pain and leg swelling.  Gastrointestinal:  Positive for diarrhea (stool now soft - no longer liquid). Negative for nausea.  Genitourinary:  Negative for dysuria and hematuria.  Neurological:  Negative for dizziness, light-headedness and headaches.  Psychiatric/Behavioral:  Negative for dysphoric mood and sleep disturbance. The patient is not nervous/anxious.      Lab Results  Component Value Date   NA 141 10/02/2022   K 4.7 10/02/2022   CO2 20 10/02/2022   GLUCOSE 109 (H) 10/02/2022   BUN 20 10/02/2022   CREATININE 1.00 10/02/2022   CALCIUM 9.9 10/02/2022   EGFR 62 10/02/2022   GFRNONAA 57 (L) 12/15/2019   Lab Results  Component Value Date   CHOL 204 (H) 10/02/2022   HDL 52 10/02/2022   LDLCALC 122 (H) 10/02/2022   TRIG 173 (H) 10/02/2022   CHOLHDL 3.9 10/02/2022   Lab Results  Component Value Date   TSH 1.460 03/28/2022   Lab  Results  Component Value Date   HGBA1C 6.1 (H) 10/02/2022   Lab Results  Component Value Date   WBC 7.6 10/02/2022   HGB 14.0 10/02/2022   HCT 41.4 10/02/2022   MCV 90 10/02/2022   PLT 237 10/02/2022   Lab Results  Component Value Date   ALT 15 10/02/2022   AST 18 10/02/2022   ALKPHOS 86 10/02/2022   BILITOT 0.4 10/02/2022   Lab Results  Component Value Date   VD25OH 18.6 (L) 03/28/2022     Patient Active Problem List   Diagnosis Date Noted   Osteopenia 03/28/2022   HSV (herpes simplex virus) infection 12/15/2019   Rectocele 07/27/2019   Arthralgia of left temporomandibular joint 10/15/2017   Pre-diabetes 08/16/2017   Environmental and seasonal allergies 07/20/2016   Left knee pain 08/12/2015   History of palpitations 08/12/2015   Essential (primary) hypertension 01/17/2015   Arthralgia of hand 01/17/2015   Adult BMI 40.0-44.9 kg/sq m (HCC) 12/30/2009   Mixed hyperlipidemia 03/25/2009    Allergies  Allergen Reactions   Hydrochlorothiazide Shortness Of Breath, Swelling and Other (See Comments)    Patient states throat swelled up, felt SOB, chest pain, and tingling in arms and shoulders.   Amlodipine    Hydrocodone    Macrobid [Nitrofurantoin] Swelling    Lip swelling, lip numbness, shortness of breath   Codeine Nausea And Vomiting   Lisinopril Cough   Loratadine Palpitations  Metoprolol Other (See Comments)    bradycardia   Olmesartan Rash    Past Surgical History:  Procedure Laterality Date   CHOLECYSTECTOMY  2013   ENDOMETRIAL BIOPSY  11/16/09   Disordered Proliferative Endometrium, negative for hyperplasia, atypia or malignancy    Social History   Tobacco Use   Smoking status: Never   Smokeless tobacco: Never  Vaping Use   Vaping status: Never Used  Substance Use Topics   Alcohol use: No    Alcohol/week: 0.0 standard drinks of alcohol   Drug use: No     Medication list has been reviewed and updated.  Current Meds  Medication Sig    Ascorbic Acid (VITAMIN C) 1000 MG tablet Take 1,000 mg by mouth daily.   BYSTOLIC 5 MG tablet TAKE 1 TABLET(5 MG) BY MOUTH DAILY   cyclobenzaprine (FLEXERIL) 10 MG tablet Take 1 tablet (10 mg total) by mouth at bedtime. As needed for jaw pain   D-Mannose POWD Take by mouth 3 (three) times a week.   dicyclomine (BENTYL) 20 MG tablet Take by mouth.   nitrofurantoin, macrocrystal-monohydrate, (MACROBID) 100 MG capsule Take by mouth.   ondansetron (ZOFRAN-ODT) 4 MG disintegrating tablet Take by mouth.   sulfamethoxazole-trimethoprim (BACTRIM DS) 800-160 MG tablet Take 1 tablet by mouth 2 (two) times daily for 5 days.   UNABLE TO FIND A-F Betafood   valACYclovir (VALTREX) 1000 MG tablet Take 1 tablet (1,000 mg total) by mouth 2 (two) times daily. 2 tabs every 12 hours for 2 doses as needed   Vitamin D, Ergocalciferol, (DRISDOL) 1.25 MG (50000 UNIT) CAPS capsule TAKE 1 CAPSULE BY MOUTH EVERY 7 DAYS       12/05/2022    3:52 PM 10/02/2022    8:08 AM 05/10/2022    1:07 PM 03/28/2022    8:10 AM  GAD 7 : Generalized Anxiety Score  Nervous, Anxious, on Edge 0 0 0 0  Control/stop worrying 0 0 0 0  Worry too much - different things 0 0 0 0  Trouble relaxing 0 0 0 0  Restless 0 0 0 0  Easily annoyed or irritable 0 0 0 0  Afraid - awful might happen 0 0 0 0  Total GAD 7 Score 0 0 0 0  Anxiety Difficulty Not difficult at all Not difficult at all Not difficult at all Not difficult at all       12/05/2022    3:52 PM 10/02/2022    8:07 AM 05/10/2022    1:06 PM  Depression screen PHQ 2/9  Decreased Interest 0 0 0  Down, Depressed, Hopeless 0 0 0  PHQ - 2 Score 0 0 0  Altered sleeping 0 0 0  Tired, decreased energy 0 0 0  Change in appetite 0 0 0  Feeling bad or failure about yourself  0 0 0  Trouble concentrating 0 0 0  Moving slowly or fidgety/restless 0 0 0  Suicidal thoughts 0 0 0  PHQ-9 Score 0 0 0  Difficult doing work/chores Not difficult at all Not difficult at all Not difficult at all     BP Readings from Last 3 Encounters:  02/25/23 135/76  02/21/23 (!) 169/73  12/05/22 129/72    Physical Exam Vitals and nursing note reviewed.  Constitutional:      General: She is not in acute distress.    Appearance: Normal appearance. She is well-developed.  HENT:     Head: Normocephalic and atraumatic.     Mouth/Throat:  Lips: Pink.     Mouth: Mucous membranes are moist. No angioedema.  Cardiovascular:     Rate and Rhythm: Normal rate and regular rhythm.     Heart sounds: No murmur heard. Pulmonary:     Effort: Pulmonary effort is normal. No respiratory distress.     Breath sounds: No wheezing or rhonchi.  Abdominal:     General: Abdomen is protuberant. Bowel sounds are normal.     Palpations: Abdomen is soft.     Tenderness: There is no abdominal tenderness.  Musculoskeletal:     Cervical back: Normal range of motion.     Right lower leg: No edema.     Left lower leg: No edema.  Lymphadenopathy:     Cervical: No cervical adenopathy.  Skin:    General: Skin is warm and dry.     Findings: No rash.  Neurological:     Mental Status: She is alert and oriented to person, place, and time.  Psychiatric:        Mood and Affect: Mood normal.        Behavior: Behavior normal.     Wt Readings from Last 3 Encounters:  02/25/23 294 lb (133.4 kg)  12/05/22 285 lb (129.3 kg)  10/02/22 286 lb (129.7 kg)    BP 135/76 (BP Location: Right Arm, Cuff Size: Large)   Pulse 62   Ht 5\' 7"  (1.702 m)   Wt 294 lb (133.4 kg)   SpO2 98%   BMI 46.05 kg/m   Assessment and Plan:  Problem List Items Addressed This Visit   None Visit Diagnoses     Acute cystitis without hematuria    -  Primary   Complete 5 day course of Bactrim push fluids   Infection due to Norovirus species       resolved with supportive care   Allergic reaction due to antibacterial drug       macrobid symptoms resolved after UC visit and benadryl and prednisone and have not recurred.       No  follow-ups on file.    Reubin Milan, MD [facility] [place]

## 2023-03-28 ENCOUNTER — Ambulatory Visit: Payer: BC Managed Care – PPO | Admitting: Internal Medicine

## 2023-03-28 ENCOUNTER — Encounter: Payer: Self-pay | Admitting: Internal Medicine

## 2023-03-28 VITALS — BP 128/74 | HR 46 | Ht 67.0 in | Wt 295.0 lb

## 2023-03-28 DIAGNOSIS — N3000 Acute cystitis without hematuria: Secondary | ICD-10-CM

## 2023-03-28 LAB — POCT URINALYSIS DIPSTICK
Bilirubin, UA: NEGATIVE
Blood, UA: NEGATIVE
Glucose, UA: NEGATIVE
Ketones, UA: NEGATIVE
Nitrite, UA: NEGATIVE
Protein, UA: NEGATIVE
Spec Grav, UA: 1.025
Urobilinogen, UA: 0.2 U/dL
pH, UA: 7

## 2023-03-28 NOTE — Progress Notes (Signed)
Date:  03/28/2023   Name:  Courtney Robinson   DOB:  1955/11/14   MRN:  [id number]   Chief Complaint: Urinary Tract Infection (Fatigue. Delirium per patient. Started amoxicillin and already started feeling better. Symptoms started 2 days ago. )  Urinary Tract Infection  This is a new problem. Episode onset: 2 days ago. The problem occurs every urination. The problem has been gradually improving. The quality of the pain is described as burning. The pain is mild. There has been no fever. Associated symptoms include frequency. Pertinent negatives include no chills, hematuria or nausea. She has tried antibiotics for the symptoms.  Three doses of Amox 250 mg tid which seems to have helped.  Review of Systems  Constitutional:  Negative for chills, fatigue and fever.  HENT:  Negative for trouble swallowing.   Respiratory:  Negative for chest tightness and shortness of breath.   Cardiovascular:  Negative for chest pain.  Gastrointestinal:  Negative for abdominal pain and nausea.  Genitourinary:  Positive for dysuria and frequency. Negative for hematuria.  Psychiatric/Behavioral:  Negative for dysphoric mood and sleep disturbance. The patient is not nervous/anxious.      Lab Results  Component Value Date   NA 141 10/02/2022   K 4.7 10/02/2022   CO2 20 10/02/2022   GLUCOSE 109 (H) 10/02/2022   BUN 20 10/02/2022   CREATININE 1.00 10/02/2022   CALCIUM 9.9 10/02/2022   EGFR 62 10/02/2022   GFRNONAA 57 (L) 12/15/2019   Lab Results  Component Value Date   CHOL 204 (H) 10/02/2022   HDL 52 10/02/2022   LDLCALC 122 (H) 10/02/2022   TRIG 173 (H) 10/02/2022   CHOLHDL 3.9 10/02/2022   Lab Results  Component Value Date   TSH 1.460 03/28/2022   Lab Results  Component Value Date   HGBA1C 6.1 (H) 10/02/2022   Lab Results  Component Value Date   WBC 7.6 10/02/2022   HGB 14.0 10/02/2022   HCT 41.4 10/02/2022   MCV 90 10/02/2022   PLT 237 10/02/2022   Lab Results  Component  Value Date   ALT 15 10/02/2022   AST 18 10/02/2022   ALKPHOS 86 10/02/2022   BILITOT 0.4 10/02/2022   Lab Results  Component Value Date   VD25OH 18.6 (L) 03/28/2022     Patient Active Problem List   Diagnosis Date Noted   Osteopenia 03/28/2022   HSV (herpes simplex virus) infection 12/15/2019   Rectocele 07/27/2019   Arthralgia of left temporomandibular joint 10/15/2017   Pre-diabetes 08/16/2017   Environmental and seasonal allergies 07/20/2016   Left knee pain 08/12/2015   History of palpitations 08/12/2015   Essential (primary) hypertension 01/17/2015   Arthralgia of hand 01/17/2015   Adult BMI 40.0-44.9 kg/sq m (HCC) 12/30/2009   Mixed hyperlipidemia 03/25/2009    Allergies  Allergen Reactions   Hydrochlorothiazide Shortness Of Breath, Swelling and Other (See Comments)    Patient states throat swelled up, felt SOB, chest pain, and tingling in arms and shoulders.   Amlodipine    Hydrocodone    Macrobid [Nitrofurantoin] Swelling    Lip swelling, lip numbness, shortness of breath   Codeine Nausea And Vomiting   Lisinopril Cough   Loratadine Palpitations   Metoprolol Other (See Comments)    bradycardia   Olmesartan Rash    Past Surgical History:  Procedure Laterality Date   CHOLECYSTECTOMY  2013   ENDOMETRIAL BIOPSY  11/16/09   Disordered Proliferative Endometrium, negative for hyperplasia, atypia or malignancy  Social History   Tobacco Use   Smoking status: Never   Smokeless tobacco: Never  Vaping Use   Vaping status: Never Used  Substance Use Topics   Alcohol use: No    Alcohol/week: 0.0 standard drinks of alcohol   Drug use: No     Medication list has been reviewed and updated.  Current Meds  Medication Sig   Ascorbic Acid (VITAMIN C) 1000 MG tablet Take 1,000 mg by mouth daily.   BYSTOLIC 5 MG tablet TAKE 1 TABLET(5 MG) BY MOUTH DAILY   cyclobenzaprine (FLEXERIL) 10 MG tablet Take 1 tablet (10 mg total) by mouth at bedtime. As needed for jaw  pain   D-Mannose POWD Take by mouth 3 (three) times a week.   UNABLE TO FIND A-F Betafood   valACYclovir (VALTREX) 1000 MG tablet Take 1 tablet (1,000 mg total) by mouth 2 (two) times daily. 2 tabs every 12 hours for 2 doses as needed   Vitamin D, Ergocalciferol, (DRISDOL) 1.25 MG (50000 UNIT) CAPS capsule TAKE 1 CAPSULE BY MOUTH EVERY 7 DAYS       03/28/2023   11:34 AM 12/05/2022    3:52 PM 10/02/2022    8:08 AM 05/10/2022    1:07 PM  GAD 7 : Generalized Anxiety Score  Nervous, Anxious, on Edge 0 0 0 0  Control/stop worrying 0 0 0 0  Worry too much - different things 0 0 0 0  Trouble relaxing 0 0 0 0  Restless 0 0 0 0  Easily annoyed or irritable 0 0 0 0  Afraid - awful might happen 0 0 0 0  Total GAD 7 Score 0 0 0 0  Anxiety Difficulty Not difficult at all Not difficult at all Not difficult at all Not difficult at all       03/28/2023   11:34 AM 12/05/2022    3:52 PM 10/02/2022    8:07 AM  Depression screen PHQ 2/9  Decreased Interest 0 0 0  Down, Depressed, Hopeless 0 0 0  PHQ - 2 Score 0 0 0  Altered sleeping 0 0 0  Tired, decreased energy 0 0 0  Change in appetite 0 0 0  Feeling bad or failure about yourself  0 0 0  Trouble concentrating 0 0 0  Moving slowly or fidgety/restless 0 0 0  Suicidal thoughts 0 0 0  PHQ-9 Score 0 0 0  Difficult doing work/chores Not difficult at all Not difficult at all Not difficult at all    BP Readings from Last 3 Encounters:  03/28/23 128/74  02/25/23 135/76  02/21/23 (!) 169/73    Physical Exam Vitals and nursing note reviewed.  Constitutional:      General: She is not in acute distress.    Appearance: She is well-developed.  Cardiovascular:     Rate and Rhythm: Normal rate and regular rhythm.     Heart sounds: Normal heart sounds.  Pulmonary:     Effort: Pulmonary effort is normal. No respiratory distress.     Breath sounds: Normal breath sounds.  Abdominal:     General: Bowel sounds are normal.     Palpations: Abdomen is  soft.     Tenderness: There is no abdominal tenderness. There is no right CVA tenderness, left CVA tenderness, guarding or rebound.    Lab Results  Component Value Date   COLORU yellow 03/28/2023   CLARITYU clear 03/28/2023   GLUCOSEUR Negative 03/28/2023   BILIRUBINUR neg 03/28/2023   KETONESU neg  03/28/2023   SPECGRAV 1.025 03/28/2023   RBCUR neg 03/28/2023   PHUR 7.0 03/28/2023   PROTEINUR Negative 03/28/2023   UROBILINOGEN 0.2 03/28/2023   LEUKOCYTESUR Moderate (2+) (A) 03/28/2023     Wt Readings from Last 3 Encounters:  03/28/23 295 lb (133.8 kg)  02/25/23 294 lb (133.4 kg)  12/05/22 285 lb (129.3 kg)    BP 128/74   Pulse (!) 46   Ht 5\' 7"  (1.702 m)   Wt 295 lb (133.8 kg)   SpO2 99%   BMI 46.20 kg/m   Assessment and Plan:  Problem List Items Addressed This Visit   None Visit Diagnoses       Acute cystitis without hematuria    -  Primary   improving on Amox - take one more day tid. push fluids; will send for culture and advise   Relevant Orders   POCT Urinalysis Dipstick (Completed)   Urine Culture       No follow-ups on file.    Reubin Milan, MD [facility] [place]

## 2023-03-30 LAB — URINE CULTURE: Organism ID, Bacteria: NO GROWTH

## 2023-03-31 ENCOUNTER — Encounter: Payer: Self-pay | Admitting: Internal Medicine

## 2023-05-07 DIAGNOSIS — R293 Abnormal posture: Secondary | ICD-10-CM | POA: Diagnosis not present

## 2023-05-07 DIAGNOSIS — M26602 Left temporomandibular joint disorder, unspecified: Secondary | ICD-10-CM | POA: Diagnosis not present

## 2023-05-21 ENCOUNTER — Other Ambulatory Visit: Payer: Self-pay | Admitting: Internal Medicine

## 2023-05-21 NOTE — Telephone Encounter (Signed)
Requested medication (s) are due for refill today: no  Requested medication (s) are on the active medication list: yes  Last refill:  09/11/22 #12/3  Future visit scheduled: yes  Notes to clinic:  no recent Vit D level, not delegated      Requested Prescriptions  Pending Prescriptions Disp Refills   Vitamin D, Ergocalciferol, (DRISDOL) 1.25 MG (50000 UNIT) CAPS capsule [Pharmacy Med Name: VITAMIN D2 50,000IU (ERGO) CAP RX] 12 capsule 3    Sig: TAKE 1 CAPSULE BY MOUTH EVERY 7 DAYS     Endocrinology:  Vitamins - Vitamin D Supplementation 2 Failed - 05/21/2023  4:18 PM      Failed - Manual Review: Route requests for 50,000 IU strength to the provider      Failed - Vitamin D in normal range and within 360 days    Vitamin D2 1, 25 (OH)2  Date Value Ref Range Status  01/19/2015 <10 pg/mL Final   Vitamin D3 1, 25 (OH)2  Date Value Ref Range Status  01/19/2015 68 pg/mL Final    Comment:    (NOTE) Performed At: [facility] [address] [place], [place] [id number] Pepk[place] Murrell Redden MD VO:[id number]    Vitamin D 1, 25 (OH)2 Total  Date Value Ref Range Status  01/19/2015 68 pg/mL Final    Comment:    (NOTE) Reference Range: Adults: 21 - 65    Vit D, 25-Hydroxy  Date Value Ref Range Status  03/28/2022 18.6 (L) 30.0 - 100.0 ng/mL Final    Comment:    Vitamin D deficiency has been defined by the Institute of Medicine and an Endocrine Society practice guideline as a level of serum 25-OH vitamin D less than 20 ng/mL (1,2). The Endocrine Society went on to further define vitamin D insufficiency as a level between 21 and 29 ng/mL (2). 1. IOM (Institute of Medicine). 2010. Dietary reference    intakes for calcium and D. [place]: The    [employer]. 2. Holick MF, Binkley [place], Bischoff-Ferrari HA, et al.    Evaluation, treatment, and prevention of vitamin D    deficiency: an Endocrine Society clinical practice    guideline. JCEM. 2011 Jul;  96(7):1911-30.          Passed - Ca in normal range and within 360 days    Calcium  Date Value Ref Range Status  10/02/2022 9.9 8.7 - 10.3 mg/dL Final   Calcium, Total  Date Value Ref Range Status  11/20/2011 9.4 8.5 - 10.1 mg/dL Final   Calcium, Ion  Date Value Ref Range Status  05/25/2010 1.14 1.12 - 1.32 mmol/L Final         Passed - Valid encounter within last 12 months    Recent Outpatient Visits           1 month ago Acute cystitis without hematuria   [place] [facility] at [facility], Nyoka Cowden, MD   2 months ago Acute cystitis without hematuria   [place] [facility] at [facility], Nyoka Cowden, MD   5 months ago Arthralgia of left temporomandibular joint   [place] [facility] at [facility], Nyoka Cowden, MD   7 months ago Acute cystitis without hematuria   [facility] at [facility], Nyoka Cowden, MD   1 year ago Acute non-recurrent maxillary sinusitis   [place] [facility] at [facility], MD       Future Appointments             In 3 months Judithann Graves Nyoka Cowden, MD [facility] at [facility], [facility]

## 2023-05-29 ENCOUNTER — Encounter: Payer: BC Managed Care – PPO | Admitting: Internal Medicine

## 2023-06-07 DIAGNOSIS — M26602 Left temporomandibular joint disorder, unspecified: Secondary | ICD-10-CM | POA: Diagnosis not present

## 2023-06-07 DIAGNOSIS — R293 Abnormal posture: Secondary | ICD-10-CM | POA: Diagnosis not present

## 2023-06-14 ENCOUNTER — Other Ambulatory Visit: Payer: Self-pay | Admitting: Internal Medicine

## 2023-06-14 DIAGNOSIS — I10 Essential (primary) hypertension: Secondary | ICD-10-CM

## 2023-06-14 NOTE — Telephone Encounter (Signed)
 Requested Prescriptions  Pending Prescriptions Disp Refills   BYSTOLIC 5 MG tablet [Pharmacy Med Name: BYSTOLIC 5MG  TABLETS] 90 tablet 1    Sig: TAKE 1 TABLET(5 MG) BY MOUTH DAILY     Cardiovascular: Beta Blockers 3 Passed - 06/14/2023  2:26 PM      Passed - Cr in normal range and within 360 days    Creatinine  Date Value Ref Range Status  11/20/2011 1.10 0.60 - 1.30 mg/dL Final   Creatinine, Ser  Date Value Ref Range Status  10/02/2022 1.00 0.57 - 1.00 mg/dL Final         Passed - AST in normal range and within 360 days    AST  Date Value Ref Range Status  10/02/2022 18 0 - 40 IU/L Final   SGOT(AST)  Date Value Ref Range Status  05/28/2011 141 (H) 15 - 37 Unit/L Final         Passed - ALT in normal range and within 360 days    ALT  Date Value Ref Range Status  10/02/2022 15 0 - 32 IU/L Final   SGPT (ALT)  Date Value Ref Range Status  05/28/2011 69 U/L Final    Comment:    12-78 NOTE: NEW REFERENCE RANGE 03/02/2011          Passed - Last BP in normal range    BP Readings from Last 1 Encounters:  03/28/23 128/74         Passed - Last Heart Rate in normal range    Pulse Readings from Last 1 Encounters:  03/28/23 (!) 46         Passed - Valid encounter within last 6 months    Recent Outpatient Visits           2 months ago Acute cystitis without hematuria   [place] [facility] at [facility], Nyoka Cowden, MD   3 months ago Acute cystitis without hematuria   [place] [facility] at [facility], Nyoka Cowden, MD   6 months ago Arthralgia of left temporomandibular joint    Primary Care & Sports Medicine at [facility], Nyoka Cowden, MD   8 months ago Acute cystitis without hematuria   [facility] at [facility], Nyoka Cowden, MD   1 year ago Acute non-recurrent maxillary sinusitis    Primary Care & Sports Medicine at [facility]  Phineas Inches, MD       Future Appointments             In 2 months Judithann Graves, Nyoka Cowden, MD [facility] at [facility], [facility]

## 2023-08-05 ENCOUNTER — Other Ambulatory Visit: Payer: Self-pay | Admitting: Internal Medicine

## 2023-08-05 DIAGNOSIS — Z1231 Encounter for screening mammogram for malignant neoplasm of breast: Secondary | ICD-10-CM

## 2023-08-27 ENCOUNTER — Encounter: Payer: Self-pay | Admitting: Internal Medicine

## 2023-08-28 ENCOUNTER — Ambulatory Visit: Admitting: Internal Medicine

## 2023-08-28 ENCOUNTER — Encounter: Payer: Self-pay | Admitting: Internal Medicine

## 2023-08-28 VITALS — BP 126/78 | HR 60 | Ht 67.0 in | Wt 293.0 lb

## 2023-08-28 DIAGNOSIS — I10 Essential (primary) hypertension: Secondary | ICD-10-CM

## 2023-08-28 DIAGNOSIS — R202 Paresthesia of skin: Secondary | ICD-10-CM | POA: Diagnosis not present

## 2023-08-28 DIAGNOSIS — R200 Anesthesia of skin: Secondary | ICD-10-CM

## 2023-08-28 DIAGNOSIS — E559 Vitamin D deficiency, unspecified: Secondary | ICD-10-CM | POA: Insufficient documentation

## 2023-08-28 DIAGNOSIS — R7303 Prediabetes: Secondary | ICD-10-CM

## 2023-08-28 NOTE — Patient Instructions (Signed)
 Put Bystolic  on hold for now.

## 2023-08-28 NOTE — Assessment & Plan Note (Signed)
 Blood pressure is well controlled.  Current medications are Bystolic  5 mg. She is concerned that tingling could be a medication side effect. Recommend holding the medication for now until labs return.

## 2023-08-28 NOTE — Progress Notes (Signed)
 Date:  08/28/2023   Name:  Courtney Robinson   DOB:  12/20/1955   MRN:  [id number]   Chief Complaint: Tingling (Patient states she is having tingling in her finger tips, left side of tongue and toes,  x 1.5 weeks ago, does not radiate up arm or leg )  HPI Tingling - started about 10 days ago in finger tips of both hands and in both big toes.  The toes seem to be lessening.  Yesterday she felt that the left side of her tongue was also tingling.   No headache, weakness, tremor, injury, new medications, pain or swelling. She has hx of Vitamin D  def; prediabetes. No recent travel, blood donation, illness.  Review of Systems  Constitutional:  Negative for fatigue and unexpected weight change.  HENT:  Negative for trouble swallowing.   Eyes:  Negative for visual disturbance.  Respiratory:  Negative for cough, chest tightness, shortness of breath and wheezing.   Cardiovascular:  Negative for chest pain, palpitations and leg swelling.  Gastrointestinal:  Negative for abdominal pain, constipation and diarrhea.  Musculoskeletal:  Negative for arthralgias and myalgias.  Neurological:  Positive for numbness. Negative for dizziness, tremors, weakness, light-headedness and headaches.  Psychiatric/Behavioral:  Negative for dysphoric mood and sleep disturbance. The patient is not nervous/anxious.      Lab Results  Component Value Date   NA 141 10/02/2022   K 4.7 10/02/2022   CO2 20 10/02/2022   GLUCOSE 109 (H) 10/02/2022   BUN 20 10/02/2022   CREATININE 1.00 10/02/2022   CALCIUM 9.9 10/02/2022   EGFR 62 10/02/2022   GFRNONAA 57 (L) 12/15/2019   Lab Results  Component Value Date   CHOL 204 (H) 10/02/2022   HDL 52 10/02/2022   LDLCALC 122 (H) 10/02/2022   TRIG 173 (H) 10/02/2022   CHOLHDL 3.9 10/02/2022   Lab Results  Component Value Date   TSH 1.460 03/28/2022   Lab Results  Component Value Date   HGBA1C 6.1 (H) 10/02/2022   Lab Results  Component Value Date   WBC 7.6  10/02/2022   HGB 14.0 10/02/2022   HCT 41.4 10/02/2022   MCV 90 10/02/2022   PLT 237 10/02/2022   Lab Results  Component Value Date   ALT 15 10/02/2022   AST 18 10/02/2022   ALKPHOS 86 10/02/2022   BILITOT 0.4 10/02/2022   Lab Results  Component Value Date   VD25OH 18.6 (L) 03/28/2022     Patient Active Problem List   Diagnosis Date Noted   Vitamin D  deficiency 08/28/2023   Osteopenia 03/28/2022   HSV (herpes simplex virus) infection 12/15/2019   Rectocele 07/27/2019   Arthralgia of left temporomandibular joint 10/15/2017   Pre-diabetes 08/16/2017   Environmental and seasonal allergies 07/20/2016   Left knee pain 08/12/2015   History of palpitations 08/12/2015   Essential (primary) hypertension 01/17/2015   Arthralgia of hand 01/17/2015   Adult BMI 40.0-44.9 kg/sq m (HCC) 12/30/2009   Mixed hyperlipidemia 03/25/2009    Allergies  Allergen Reactions   Hydrochlorothiazide  Shortness Of Breath, Swelling and Other (See Comments)    Patient states throat swelled up, felt SOB, chest pain, and tingling in arms and shoulders.   Amlodipine    Hydrocodone    Macrobid [Nitrofurantoin] Swelling    Lip swelling, lip numbness, shortness of breath   Codeine  Nausea And Vomiting   Lisinopril  Cough   Loratadine Palpitations   Metoprolol Other (See Comments)    bradycardia   Olmesartan  Rash    Past Surgical History:  Procedure Laterality Date   CHOLECYSTECTOMY  2013   ENDOMETRIAL BIOPSY  11/16/09   Disordered Proliferative Endometrium, negative for hyperplasia, atypia or malignancy    Social History   Tobacco Use   Smoking status: Never   Smokeless tobacco: Never  Vaping Use   Vaping status: Never Used  Substance Use Topics   Alcohol use: No    Alcohol/week: 0.0 standard drinks of alcohol   Drug use: No     Medication list has been reviewed and updated.  Current Meds  Medication Sig   Ascorbic Acid (VITAMIN C) 1000 MG tablet Take 1,000 mg by mouth daily.    BYSTOLIC  5 MG tablet TAKE 1 TABLET(5 MG) BY MOUTH DAILY   cyclobenzaprine  (FLEXERIL ) 10 MG tablet Take 1 tablet (10 mg total) by mouth at bedtime. As needed for jaw pain   D-Mannose POWD Take by mouth 3 (three) times a week.   UNABLE TO FIND A-F Betafood   UNABLE TO FIND Med Name: Estill Hemming urinary care supplement -   UNABLE TO FIND Med Name: General female endocrine pack pills - 1 black currant seed oil, 2 catalyn, 1 hypothalamus PMG, 1 symplex F, 2 tuna omega-3 oil   valACYclovir  (VALTREX ) 1000 MG tablet Take 1 tablet (1,000 mg total) by mouth 2 (two) times daily. 2 tabs every 12 hours for 2 doses as needed   Vitamin D , Ergocalciferol , (DRISDOL ) 1.25 MG (50000 UNIT) CAPS capsule TAKE 1 CAPSULE BY MOUTH EVERY 7 DAYS       08/28/2023   10:01 AM 03/28/2023   11:34 AM 12/05/2022    3:52 PM 10/02/2022    8:08 AM  GAD 7 : Generalized Anxiety Score  Nervous, Anxious, on Edge 0 0 0 0  Control/stop worrying 0 0 0 0  Worry too much - different things 0 0 0 0  Trouble relaxing 0 0 0 0  Restless 0 0 0 0  Easily annoyed or irritable 0 0 0 0  Afraid - awful might happen 0 0 0 0  Total GAD 7 Score 0 0 0 0  Anxiety Difficulty Not difficult at all Not difficult at all Not difficult at all Not difficult at all       08/28/2023   10:01 AM 03/28/2023   11:34 AM 12/05/2022    3:52 PM  Depression screen PHQ 2/9  Decreased Interest 0 0 0  Down, Depressed, Hopeless 0 0 0  PHQ - 2 Score 0 0 0  Altered sleeping 0 0 0  Tired, decreased energy 0 0 0  Change in appetite 0 0 0  Feeling bad or failure about yourself  0 0 0  Trouble concentrating 0 0 0  Moving slowly or fidgety/restless 0 0 0  Suicidal thoughts 0 0 0  PHQ-9 Score 0 0 0  Difficult doing work/chores Not difficult at all Not difficult at all Not difficult at all    BP Readings from Last 3 Encounters:  08/28/23 126/78  03/28/23 128/74  02/25/23 135/76    Physical Exam Vitals and nursing note reviewed.  Constitutional:      General: She  is not in acute distress.    Appearance: Normal appearance. She is well-developed. She is obese.  HENT:     Head: Normocephalic and atraumatic.  Neck:     Vascular: No carotid bruit.  Cardiovascular:     Rate and Rhythm: Normal rate and regular rhythm.     Pulses:  Radial pulses are 2+ on the right side and 2+ on the left side.       Popliteal pulses are 2+ on the right side and 2+ on the left side.       Dorsalis pedis pulses are 2+ on the right side and 2+ on the left side.     Heart sounds: Normal heart sounds.  Pulmonary:     Effort: Pulmonary effort is normal. No respiratory distress.     Breath sounds: No wheezing or rhonchi.  Musculoskeletal:     Cervical back: Normal range of motion.     Right lower leg: No edema.     Left lower leg: No edema.  Lymphadenopathy:     Cervical: No cervical adenopathy.  Skin:    General: Skin is warm and dry.     Findings: No rash.  Neurological:     Mental Status: She is alert and oriented to person, place, and time.     Cranial Nerves: Cranial nerves 2-12 are intact.     Sensory: Sensory deficit (decreased sensation all fingers distally, both great toes on the pad and left side of tongue) present.     Motor: Motor function is intact.     Coordination: Coordination is intact.     Deep Tendon Reflexes:     Reflex Scores:      Bicep reflexes are 2+ on the right side and 2+ on the left side.      Patellar reflexes are 2+ on the right side and 2+ on the left side. Psychiatric:        Attention and Perception: Attention normal.        Mood and Affect: Mood normal.        Behavior: Behavior normal.     Wt Readings from Last 3 Encounters:  08/28/23 293 lb (132.9 kg)  03/28/23 295 lb (133.8 kg)  02/25/23 294 lb (133.4 kg)    BP 126/78   Pulse 60   Ht 5\' 7"  (1.702 m)   Wt 293 lb (132.9 kg)   SpO2 97%   BMI 45.89 kg/m   Assessment and Plan:  Problem List Items Addressed This Visit       Unprioritized   Essential  (primary) hypertension (Chronic)   Blood pressure is well controlled.  Current medications are Bystolic  5 mg. She is concerned that tingling could be a medication side effect. Recommend holding the medication for now until labs return.        Pre-diabetes (Chronic)   Will obtain CMP and A1C.      Relevant Orders   Hemoglobin A1c   Vitamin D  deficiency   Relevant Orders   VITAMIN D  25 Hydroxy (Vit-D Deficiency, Fractures)   Other Visit Diagnoses       Numbness and tingling in both hands    -  Primary   stroke unlikely since all extremeties are affected rule out metabolic cause hold Bystolic  for now   Relevant Orders   Comprehensive metabolic panel with GFR   Vitamin B12   TSH       No follow-ups on file.    Sheron Dixons, MD [facility] [place]

## 2023-08-28 NOTE — Assessment & Plan Note (Signed)
 Will obtain CMP and A1C.

## 2023-08-29 ENCOUNTER — Ambulatory Visit: Payer: Self-pay | Admitting: Internal Medicine

## 2023-08-29 LAB — COMPREHENSIVE METABOLIC PANEL WITH GFR
ALT: 14 IU/L (ref 0–32)
AST: 21 IU/L (ref 0–40)
Albumin: 4.6 g/dL (ref 3.9–4.9)
Alkaline Phosphatase: 89 IU/L (ref 44–121)
BUN/Creatinine Ratio: 16 (ref 12–28)
BUN: 18 mg/dL (ref 8–27)
Bilirubin Total: 0.3 mg/dL (ref 0.0–1.2)
CO2: 19 mmol/L — ABNORMAL LOW (ref 20–29)
Calcium: 10 mg/dL (ref 8.7–10.3)
Chloride: 103 mmol/L (ref 96–106)
Creatinine, Ser: 1.11 mg/dL — ABNORMAL HIGH (ref 0.57–1.00)
Globulin, Total: 3.4 g/dL (ref 1.5–4.5)
Glucose: 106 mg/dL — ABNORMAL HIGH (ref 70–99)
Potassium: 4.9 mmol/L (ref 3.5–5.2)
Sodium: 141 mmol/L (ref 134–144)
Total Protein: 8 g/dL (ref 6.0–8.5)
eGFR: 54 mL/min/{1.73_m2} — ABNORMAL LOW (ref >59)

## 2023-08-29 LAB — HEMOGLOBIN A1C
Est. average glucose Bld gHb Est-mCnc: 131 mg/dL
Hgb A1c MFr Bld: 6.2 % — ABNORMAL HIGH (ref 4.8–5.6)

## 2023-08-29 LAB — TSH: TSH: 1.78 u[IU]/mL (ref 0.450–4.500)

## 2023-08-29 LAB — VITAMIN D 25 HYDROXY (VIT D DEFICIENCY, FRACTURES): Vit D, 25-Hydroxy: 39.9 ng/mL (ref 30.0–100.0)

## 2023-08-29 LAB — VITAMIN B12: Vitamin B-12: 750 pg/mL (ref 232–1245)

## 2023-08-30 NOTE — Telephone Encounter (Signed)
Please review patient's message:

## 2023-08-30 NOTE — Telephone Encounter (Signed)
 Copied from CRM [id number]. Topic: Clinical - Lab/Test Results >> Aug 30, 2023  8:34 AM El Gravely T wrote: Reason for CRM: Patient requesting to speak directly to provider regarding lab/test results.  Per patient is requesting to speak directly to provider regarding direct message sent, as patient has additional questions for the provider.   Please call to discuss further [phone].

## 2023-09-16 DIAGNOSIS — H43392 Other vitreous opacities, left eye: Secondary | ICD-10-CM | POA: Diagnosis not present

## 2023-09-16 DIAGNOSIS — H25813 Combined forms of age-related cataract, bilateral: Secondary | ICD-10-CM | POA: Diagnosis not present

## 2023-09-16 DIAGNOSIS — G43109 Migraine with aura, not intractable, without status migrainosus: Secondary | ICD-10-CM | POA: Diagnosis not present

## 2023-10-08 ENCOUNTER — Encounter: Payer: Self-pay | Admitting: Internal Medicine

## 2023-10-08 ENCOUNTER — Ambulatory Visit: Admitting: Internal Medicine

## 2023-10-08 VITALS — BP 146/78 | HR 86 | Ht 67.0 in | Wt 293.0 lb

## 2023-10-08 DIAGNOSIS — R200 Anesthesia of skin: Secondary | ICD-10-CM | POA: Diagnosis not present

## 2023-10-08 DIAGNOSIS — M79642 Pain in left hand: Secondary | ICD-10-CM

## 2023-10-08 DIAGNOSIS — M65342 Trigger finger, left ring finger: Secondary | ICD-10-CM | POA: Diagnosis not present

## 2023-10-08 DIAGNOSIS — R202 Paresthesia of skin: Secondary | ICD-10-CM

## 2023-10-08 DIAGNOSIS — I10 Essential (primary) hypertension: Secondary | ICD-10-CM

## 2023-10-08 DIAGNOSIS — M79641 Pain in right hand: Secondary | ICD-10-CM | POA: Diagnosis not present

## 2023-10-08 DIAGNOSIS — M25542 Pain in joints of left hand: Secondary | ICD-10-CM

## 2023-10-08 DIAGNOSIS — M25541 Pain in joints of right hand: Secondary | ICD-10-CM

## 2023-10-08 MED ORDER — BYSTOLIC 5 MG PO TABS: 5.0000 mg | ORAL_TABLET | Freq: Every day | ORAL | 1 refills | Status: DC

## 2023-10-08 NOTE — Progress Notes (Signed)
 Date:  10/08/2023   Name:  Courtney Robinson   DOB:  Sep 05, 1955   MRN:  [id number]   Chief Complaint: Hand Pain  Hypertension This is a chronic problem. The problem is uncontrolled. Pertinent negatives include no chest pain, headaches, palpitations or shortness of breath. Past treatments include beta blockers (stopped Bystolic  due to hand tingling).  Hand Pain  There was no injury mechanism. The pain is present in the left fingers (triggering of left ring finger). Associated symptoms include numbness. Pertinent negatives include no chest pain.  Tingling - of distal fingers on both hands, randomly and not associated with pain or particular activity.  Review of Systems  Constitutional:  Negative for chills, fatigue and fever.  Respiratory:  Negative for chest tightness, shortness of breath and wheezing.   Cardiovascular:  Negative for chest pain and palpitations.  Musculoskeletal:  Positive for arthralgias and joint swelling.  Neurological:  Positive for numbness. Negative for dizziness, tremors, weakness and headaches.  Psychiatric/Behavioral:  Negative for dysphoric mood and sleep disturbance. The patient is not nervous/anxious.      Lab Results  Component Value Date   NA 141 08/28/2023   K 4.9 08/28/2023   CO2 19 (L) 08/28/2023   GLUCOSE 106 (H) 08/28/2023   BUN 18 08/28/2023   CREATININE 1.11 (H) 08/28/2023   CALCIUM 10.0 08/28/2023   EGFR 54 (L) 08/28/2023   GFRNONAA 57 (L) 12/15/2019   Lab Results  Component Value Date   CHOL 204 (H) 10/02/2022   HDL 52 10/02/2022   LDLCALC 122 (H) 10/02/2022   TRIG 173 (H) 10/02/2022   CHOLHDL 3.9 10/02/2022   Lab Results  Component Value Date   TSH 1.780 08/28/2023   Lab Results  Component Value Date   HGBA1C 6.2 (H) 08/28/2023   Lab Results  Component Value Date   WBC 7.6 10/02/2022   HGB 14.0 10/02/2022   HCT 41.4 10/02/2022   MCV 90 10/02/2022   PLT 237 10/02/2022   Lab Results  Component Value Date   ALT  14 08/28/2023   AST 21 08/28/2023   ALKPHOS 89 08/28/2023   BILITOT 0.3 08/28/2023   Lab Results  Component Value Date   VD25OH 39.9 08/28/2023     Patient Active Problem List   Diagnosis Date Noted   Vitamin D  deficiency 08/28/2023   Osteopenia 03/28/2022   HSV (herpes simplex virus) infection 12/15/2019   Rectocele 07/27/2019   Arthralgia of left temporomandibular joint 10/15/2017   Pre-diabetes 08/16/2017   Environmental and seasonal allergies 07/20/2016   Left knee pain 08/12/2015   History of palpitations 08/12/2015   Essential (primary) hypertension 01/17/2015   Arthralgia of hand 01/17/2015   Adult BMI 40.0-44.9 kg/sq m (HCC) 12/30/2009   Mixed hyperlipidemia 03/25/2009    Allergies  Allergen Reactions   Hydrochlorothiazide  Shortness Of Breath, Swelling and Other (See Comments)    Patient states throat swelled up, felt SOB, chest pain, and tingling in arms and shoulders.   Amlodipine    Hydrocodone    Macrobid [Nitrofurantoin] Swelling    Lip swelling, lip numbness, shortness of breath   Codeine  Nausea And Vomiting   Lisinopril  Cough   Loratadine Palpitations   Metoprolol Other (See Comments)    bradycardia   Olmesartan Rash    Past Surgical History:  Procedure Laterality Date   CHOLECYSTECTOMY  2013   ENDOMETRIAL BIOPSY  11/16/09   Disordered Proliferative Endometrium, negative for hyperplasia, atypia or malignancy    Social History  Tobacco Use   Smoking status: Never   Smokeless tobacco: Never  Vaping Use   Vaping status: Never Used  Substance Use Topics   Alcohol use: No    Alcohol/week: 0.0 standard drinks of alcohol   Drug use: No     Medication list has been reviewed and updated.  Current Meds  Medication Sig   Ascorbic Acid (VITAMIN C) 1000 MG tablet Take 1,000 mg by mouth daily.   cyclobenzaprine  (FLEXERIL ) 10 MG tablet Take 1 tablet (10 mg total) by mouth at bedtime. As needed for jaw pain   D-Mannose POWD Take by mouth 3 (three)  times a week.   dicyclomine (BENTYL) 20 MG tablet Take by mouth.   UNABLE TO FIND A-F Betafood   UNABLE TO FIND Med Name: Verlon urinary care supplement -   UNABLE TO FIND Med Name: General female endocrine pack pills - 1 black currant seed oil, 2 catalyn, 1 hypothalamus PMG, 1 symplex F, 2 tuna omega-3 oil   valACYclovir  (VALTREX ) 1000 MG tablet Take 1 tablet (1,000 mg total) by mouth 2 (two) times daily. 2 tabs every 12 hours for 2 doses as needed   Vitamin D , Ergocalciferol , (DRISDOL ) 1.25 MG (50000 UNIT) CAPS capsule TAKE 1 CAPSULE BY MOUTH EVERY 7 DAYS       10/08/2023    3:17 PM 08/28/2023   10:01 AM 03/28/2023   11:34 AM 12/05/2022    3:52 PM  GAD 7 : Generalized Anxiety Score  Nervous, Anxious, on Edge 0 0 0 0  Control/stop worrying 0 0 0 0  Worry too much - different things 0 0 0 0  Trouble relaxing 0 0 0 0  Restless 0 0 0 0  Easily annoyed or irritable 0 0 0 0  Afraid - awful might happen 0 0 0 0  Total GAD 7 Score 0 0 0 0  Anxiety Difficulty Not difficult at all Not difficult at all Not difficult at all Not difficult at all       10/08/2023    3:17 PM 08/28/2023   10:01 AM 03/28/2023   11:34 AM  Depression screen PHQ 2/9  Decreased Interest 0 0 0  Down, Depressed, Hopeless 0 0 0  PHQ - 2 Score 0 0 0  Altered sleeping 0 0 0  Tired, decreased energy 0 0 0  Change in appetite 0 0 0  Feeling bad or failure about yourself  0 0 0  Trouble concentrating 0 0 0  Moving slowly or fidgety/restless 0 0 0  Suicidal thoughts 0 0 0  PHQ-9 Score 0 0 0  Difficult doing work/chores Not difficult at all Not difficult at all Not difficult at all    BP Readings from Last 3 Encounters:  10/08/23 (!) 146/78  08/28/23 126/78  03/28/23 128/74    Physical Exam Vitals and nursing note reviewed.  Constitutional:      General: She is not in acute distress.    Appearance: She is well-developed.  HENT:     Head: Normocephalic and atraumatic.   Cardiovascular:     Rate and Rhythm:  Normal rate and regular rhythm.  Pulmonary:     Effort: Pulmonary effort is normal. No respiratory distress.     Breath sounds: No wheezing or rhonchi.   Musculoskeletal:     Right hand: No tenderness. Normal range of motion.     Left hand: No tenderness. Decreased range of motion (4th finger). Decreased sensation (index and fourth fingertips).   Skin:  General: Skin is warm and dry.     Findings: No rash.   Neurological:     Mental Status: She is alert and oriented to person, place, and time.   Psychiatric:        Mood and Affect: Mood normal.        Behavior: Behavior normal.     Wt Readings from Last 3 Encounters:  10/08/23 293 lb (132.9 kg)  08/28/23 293 lb (132.9 kg)  03/28/23 295 lb (133.8 kg)    BP (!) 146/78   Pulse 86   Ht 5' 7 (1.702 m)   Wt 293 lb (132.9 kg)   SpO2 98%   BMI 45.89 kg/m   Assessment and Plan:  Problem List Items Addressed This Visit       Unprioritized   Essential (primary) hypertension (Chronic)   BP elevated since stopping Bystolic . Need to resume medications since tingling returned off of medications Monitor at home if desired Follow up in October      Relevant Medications   BYSTOLIC  5 MG tablet   Arthralgia of hand   May be trigger finger +/- OA Will refer to Orthopedics      Other Visit Diagnoses       Pain in both hands    -  Primary   Relevant Orders   Ambulatory referral to Orthopedic Surgery     Numbness and tingling in both hands       possible carpal tunnel syndrome refer to Orthopedics for further evaluation   Relevant Orders   Ambulatory referral to Orthopedic Surgery     Trigger finger, left ring finger       Relevant Orders   Ambulatory referral to Orthopedic Surgery       No follow-ups on file.    Leita HILARIO Adie, MD [facility] [place]

## 2023-10-08 NOTE — Assessment & Plan Note (Signed)
 May be trigger finger +/- OA Will refer to Orthopedics

## 2023-10-08 NOTE — Assessment & Plan Note (Signed)
 BP elevated since stopping Bystolic . Need to resume medications since tingling returned off of medications Monitor at home if desired Follow up in October

## 2023-10-23 ENCOUNTER — Other Ambulatory Visit: Payer: Self-pay

## 2023-10-23 ENCOUNTER — Telehealth: Payer: Self-pay

## 2023-10-23 ENCOUNTER — Encounter: Payer: Self-pay | Admitting: Internal Medicine

## 2023-10-23 ENCOUNTER — Telehealth: Payer: Self-pay | Admitting: Internal Medicine

## 2023-10-23 DIAGNOSIS — R7303 Prediabetes: Secondary | ICD-10-CM

## 2023-10-23 DIAGNOSIS — M65342 Trigger finger, left ring finger: Secondary | ICD-10-CM | POA: Diagnosis not present

## 2023-10-23 DIAGNOSIS — G5603 Carpal tunnel syndrome, bilateral upper limbs: Secondary | ICD-10-CM | POA: Diagnosis not present

## 2023-10-23 NOTE — Telephone Encounter (Signed)
 Copied from CRM [id number]. Topic: Referral - Status >> Oct 23, 2023  3:43 PM Rosaria E wrote: Reason for CRM: Pt called to report that the current referral for Nutrition is too far, she is requesting a local option that is closer in proximity. Says she will investigate with her insurance to find better options.   Best contact: [phone]  Also wants to ensure that she is gendered appropriately and that her name is spelled correctly.

## 2023-10-23 NOTE — Telephone Encounter (Signed)
 Patient needs PA for Bystolic  5 mg.  Thank you!

## 2023-10-24 ENCOUNTER — Other Ambulatory Visit (HOSPITAL_COMMUNITY): Payer: Self-pay

## 2023-10-24 NOTE — Telephone Encounter (Signed)
 Sent msg to referral coordinator. Waiting for response.  CM

## 2023-10-24 NOTE — Telephone Encounter (Signed)
 Good morning Courtney Robinson, is there a medically necessary reason Courtney Robinson can not take generic Bystolic  (Nebivolol )? The brand name is plan/benefit exclusion, product not on formulary but the generic is covered. If there is a medical reason please have Dr. Justus make an addendum to her last office visit stating the reason she can not take the generic and I will send in PA and see if there are any exceptions, more than likely it will get denied but I will try.

## 2023-10-31 ENCOUNTER — Telehealth: Payer: Self-pay | Admitting: Internal Medicine

## 2023-10-31 ENCOUNTER — Other Ambulatory Visit (HOSPITAL_COMMUNITY): Payer: Self-pay

## 2023-10-31 ENCOUNTER — Encounter: Payer: Self-pay | Admitting: Internal Medicine

## 2023-10-31 NOTE — Telephone Encounter (Unsigned)
 Copied from CRM #[id number]. Topic: Clinical - Medication Prior Auth >> Oct 31, 2023  2:54 PM Donee H wrote: Reason for CRM: Patient is wanting to speak to nurse regarding Authorization for medication BYSTOLIC  5 MG tablet.

## 2023-10-31 NOTE — Telephone Encounter (Signed)
 Please complete PA for Bystolic .  KP

## 2023-10-31 NOTE — Telephone Encounter (Signed)
 I tagged Dr. Justus in the 7/16 message.  KP

## 2023-10-31 NOTE — Telephone Encounter (Signed)
 Hey Kieandra, can you see telephone encounter from 10/23/2023 please and let me know so I can submit the PA for brand name Bystolic .  Thanks!

## 2023-10-31 NOTE — Telephone Encounter (Signed)
 Please review.  KP

## 2023-11-01 ENCOUNTER — Telehealth: Payer: Self-pay | Admitting: Pharmacy Technician

## 2023-11-01 ENCOUNTER — Ambulatory Visit: Admitting: Internal Medicine

## 2023-11-01 ENCOUNTER — Other Ambulatory Visit (HOSPITAL_COMMUNITY): Payer: Self-pay

## 2023-11-01 DIAGNOSIS — K219 Gastro-esophageal reflux disease without esophagitis: Secondary | ICD-10-CM | POA: Diagnosis not present

## 2023-11-01 DIAGNOSIS — J00 Acute nasopharyngitis [common cold]: Secondary | ICD-10-CM | POA: Diagnosis not present

## 2023-11-01 DIAGNOSIS — R0989 Other specified symptoms and signs involving the circulatory and respiratory systems: Secondary | ICD-10-CM | POA: Diagnosis not present

## 2023-11-01 NOTE — Telephone Encounter (Signed)
 Noted  Sent pt message on Mychart.  KP

## 2023-11-01 NOTE — Telephone Encounter (Signed)
 Than you Chassidy, the PA was submitted and approved for 1 year

## 2023-11-01 NOTE — Telephone Encounter (Signed)
Ok thanks so much 

## 2023-11-01 NOTE — Telephone Encounter (Signed)
 Per Patient. She cannot take the generic Bystolic  due to intolerance. She has side effects of Dizziness, and Nausea with Malaise. She has failed lisinopril , metoprolol, atenolol, and amlodipine.  Thank you!

## 2023-11-01 NOTE — Telephone Encounter (Signed)
 Sent patient mychart message

## 2023-11-01 NOTE — Telephone Encounter (Signed)
 Pharmacy Patient Advocate Encounter   Received notification from Pt Calls Messages that prior authorization for Bystolic  5 mg tablets is required/requested.   Insurance verification completed.   The patient is insured through [employer] .   Per test claim: PA required and submitted KEY/EOC/Request #: [id number] APPROVED from 11/01/23 to 10/31/24. Ran test claim, Copay is $50.00 per month or $150.00 for 3 months. This test claim was processed through [facility]- copay amounts may vary at other pharmacies due to pharmacy/plan contracts, or as the patient moves through the different stages of their insurance plan.

## 2023-12-13 ENCOUNTER — Encounter: Payer: Self-pay | Admitting: Internal Medicine

## 2023-12-13 NOTE — Telephone Encounter (Signed)
 FYI  KP

## 2023-12-23 ENCOUNTER — Encounter: Payer: Self-pay | Admitting: Internal Medicine

## 2023-12-23 ENCOUNTER — Other Ambulatory Visit: Payer: Self-pay | Admitting: Internal Medicine

## 2023-12-23 DIAGNOSIS — Z1231 Encounter for screening mammogram for malignant neoplasm of breast: Secondary | ICD-10-CM

## 2023-12-23 NOTE — Telephone Encounter (Signed)
 FYI

## 2024-01-09 ENCOUNTER — Encounter: Admitting: Internal Medicine

## 2024-01-13 ENCOUNTER — Ambulatory Visit

## 2024-02-03 ENCOUNTER — Encounter: Payer: Self-pay | Admitting: Internal Medicine

## 2024-02-03 ENCOUNTER — Ambulatory Visit: Admitting: Internal Medicine

## 2024-02-03 VITALS — BP 142/84 | HR 67 | Ht 67.0 in | Wt 293.0 lb

## 2024-02-03 DIAGNOSIS — Z23 Encounter for immunization: Secondary | ICD-10-CM

## 2024-02-03 DIAGNOSIS — E559 Vitamin D deficiency, unspecified: Secondary | ICD-10-CM | POA: Diagnosis not present

## 2024-02-03 DIAGNOSIS — Z1211 Encounter for screening for malignant neoplasm of colon: Secondary | ICD-10-CM | POA: Diagnosis not present

## 2024-02-03 DIAGNOSIS — I10 Essential (primary) hypertension: Secondary | ICD-10-CM | POA: Diagnosis not present

## 2024-02-03 MED ORDER — VITAMIN D (ERGOCALCIFEROL) 1.25 MG (50000 UNIT) PO CAPS: 50000.0000 [IU] | ORAL_CAPSULE | ORAL | 1 refills | Status: AC

## 2024-02-03 MED ORDER — BYSTOLIC 5 MG PO TABS: 5.0000 mg | ORAL_TABLET | Freq: Every day | ORAL | 1 refills | Status: DC

## 2024-02-03 MED ORDER — COVID-19 MRNA VAC-TRIS(PFIZER) 30 MCG/0.3ML IM SUSY: 0.3000 mL | PREFILLED_SYRINGE | Freq: Once | INTRAMUSCULAR | 0 refills | Status: AC

## 2024-02-03 NOTE — Assessment & Plan Note (Signed)
 Supplemented with weekly high dose.

## 2024-02-03 NOTE — Assessment & Plan Note (Signed)
 Fairly well controlled blood pressure today.  She has spent the last 2 days driving home from [place]. Current regimen is Bystolic  DAW.  Will continue the same regimen. No medication side effects noted.

## 2024-02-03 NOTE — Progress Notes (Signed)
 Date:  02/03/2024   Name:  Courtney Robinson   DOB:  1955-10-21   MRN:  [id number]   Chief Complaint: Hypertension (BP check with Flu shot today.)  Hypertension This is a chronic problem. The problem is controlled. Pertinent negatives include no chest pain, headaches, palpitations or shortness of breath. Past treatments include beta blockers. The current treatment provides significant improvement. There are no compliance problems.  Hypertensive end-organ damage includes kidney disease. There is no history of CAD/MI or CVA.    Review of Systems  Constitutional:  Negative for fatigue and unexpected weight change.  HENT:  Negative for trouble swallowing.   Eyes:  Negative for visual disturbance.  Respiratory:  Negative for cough, chest tightness, shortness of breath and wheezing.   Cardiovascular:  Negative for chest pain, palpitations and leg swelling.  Gastrointestinal:  Negative for abdominal pain, constipation and diarrhea.  Musculoskeletal:  Negative for arthralgias and myalgias.  Neurological:  Negative for dizziness, weakness, light-headedness and headaches.     Lab Results  Component Value Date   NA 141 08/28/2023   K 4.9 08/28/2023   CO2 19 (L) 08/28/2023   GLUCOSE 106 (H) 08/28/2023   BUN 18 08/28/2023   CREATININE 1.11 (H) 08/28/2023   CALCIUM 10.0 08/28/2023   EGFR 54 (L) 08/28/2023   GFRNONAA 57 (L) 12/15/2019   Lab Results  Component Value Date   CHOL 204 (H) 10/02/2022   HDL 52 10/02/2022   LDLCALC 122 (H) 10/02/2022   TRIG 173 (H) 10/02/2022   CHOLHDL 3.9 10/02/2022   Lab Results  Component Value Date   TSH 1.780 08/28/2023   Lab Results  Component Value Date   HGBA1C 6.2 (H) 08/28/2023   Lab Results  Component Value Date   WBC 7.6 10/02/2022   HGB 14.0 10/02/2022   HCT 41.4 10/02/2022   MCV 90 10/02/2022   PLT 237 10/02/2022   Lab Results  Component Value Date   ALT 14 08/28/2023   AST 21 08/28/2023   ALKPHOS 89 08/28/2023    BILITOT 0.3 08/28/2023   Lab Results  Component Value Date   VD25OH 39.9 08/28/2023     Patient Active Problem List   Diagnosis Date Noted   Vitamin D  deficiency 08/28/2023   Osteopenia 03/28/2022   HSV (herpes simplex virus) infection 12/15/2019   Rectocele 07/27/2019   Arthralgia of left temporomandibular joint 10/15/2017   Pre-diabetes 08/16/2017   Environmental and seasonal allergies 07/20/2016   Left knee pain 08/12/2015   History of palpitations 08/12/2015   Essential (primary) hypertension 01/17/2015   Arthralgia of hand 01/17/2015   Adult BMI 40.0-44.9 kg/sq m (HCC) 12/30/2009   Mixed hyperlipidemia 03/25/2009    Allergies  Allergen Reactions   Hydrochlorothiazide  Shortness Of Breath, Swelling and Other (See Comments)    Patient states throat swelled up, felt SOB, chest pain, and tingling in arms and shoulders.   Amlodipine    Atenolol Other (See Comments)    Side Effects   Hydrocodone    Macrobid [Nitrofurantoin] Swelling    Lip swelling, lip numbness, shortness of breath   Codeine  Nausea And Vomiting   Lisinopril  Cough   Loratadine Palpitations   Metoprolol Other (See Comments)    bradycardia   Olmesartan Rash    Past Surgical History:  Procedure Laterality Date   CHOLECYSTECTOMY  2013   ENDOMETRIAL BIOPSY  11/16/09   Disordered Proliferative Endometrium, negative for hyperplasia, atypia or malignancy    Social History   Tobacco  Use   Smoking status: Never   Smokeless tobacco: Never  Vaping Use   Vaping status: Never Used  Substance Use Topics   Alcohol use: No    Alcohol/week: 0.0 standard drinks of alcohol   Drug use: No     Medication list has been reviewed and updated.  Current Meds  Medication Sig   Ascorbic Acid (VITAMIN C) 1000 MG tablet Take 1,000 mg by mouth daily.   COVID-19 mRNA vaccine, Pfizer, (COMIRNATY) syringe Inject 0.3 mLs into the muscle once for 1 dose.   cyclobenzaprine  (FLEXERIL ) 10 MG tablet Take 1 tablet (10 mg  total) by mouth at bedtime. As needed for jaw pain   D-Mannose POWD Take by mouth 3 (three) times a week.   dicyclomine (BENTYL) 20 MG tablet Take by mouth.   UNABLE TO FIND A-F Betafood   UNABLE TO FIND Med Name: Verlon urinary care supplement -   UNABLE TO FIND Med Name: General female endocrine pack pills - 1 black currant seed oil, 2 catalyn, 1 hypothalamus PMG, 1 symplex F, 2 tuna omega-3 oil   valACYclovir  (VALTREX ) 1000 MG tablet Take 1 tablet (1,000 mg total) by mouth 2 (two) times daily. 2 tabs every 12 hours for 2 doses as needed   [DISCONTINUED] BYSTOLIC  5 MG tablet Take 1 tablet (5 mg total) by mouth daily.   [DISCONTINUED] Vitamin D , Ergocalciferol , (DRISDOL ) 1.25 MG (50000 UNIT) CAPS capsule TAKE 1 CAPSULE BY MOUTH EVERY 7 DAYS       02/03/2024    2:41 PM 10/08/2023    3:17 PM 08/28/2023   10:01 AM 03/28/2023   11:34 AM  GAD 7 : Generalized Anxiety Score  Nervous, Anxious, on Edge 0 0 0 0  Control/stop worrying 0 0 0 0  Worry too much - different things 0 0 0 0  Trouble relaxing 0 0 0 0  Restless 0 0 0 0  Easily annoyed or irritable 0 0 0 0  Afraid - awful might happen 0 0 0 0  Total GAD 7 Score 0 0 0 0  Anxiety Difficulty Not difficult at all Not difficult at all Not difficult at all Not difficult at all       02/03/2024    2:41 PM 10/08/2023    3:17 PM 08/28/2023   10:01 AM  Depression screen PHQ 2/9  Decreased Interest 0 0 0  Down, Depressed, Hopeless 0 0 0  PHQ - 2 Score 0 0 0  Altered sleeping 0 0 0  Tired, decreased energy 0 0 0  Change in appetite 0 0 0  Feeling bad or failure about yourself  0 0 0  Trouble concentrating 0 0 0  Moving slowly or fidgety/restless 0 0 0  Suicidal thoughts 0 0 0  PHQ-9 Score 0 0 0  Difficult doing work/chores Not difficult at all Not difficult at all Not difficult at all    BP Readings from Last 3 Encounters:  02/03/24 (!) 142/84  10/08/23 (!) 146/78  08/28/23 126/78    Physical Exam Constitutional:      Appearance:  Normal appearance.  Cardiovascular:     Rate and Rhythm: Normal rate and regular rhythm.     Pulses: Normal pulses.     Heart sounds: No murmur heard. Pulmonary:     Effort: Pulmonary effort is normal.     Breath sounds: No wheezing or rhonchi.  Musculoskeletal:     Cervical back: Normal range of motion.     Right lower  leg: No edema.     Left lower leg: No edema.  Neurological:     Mental Status: She is alert.     Wt Readings from Last 3 Encounters:  02/03/24 293 lb (132.9 kg)  10/08/23 293 lb (132.9 kg)  08/28/23 293 lb (132.9 kg)    BP (!) 142/84   Pulse 67   Ht 5' 7 (1.702 m)   Wt 293 lb (132.9 kg)   SpO2 97%   BMI 45.89 kg/m   Assessment and Plan:  Problem List Items Addressed This Visit       Unprioritized   Essential (primary) hypertension (Chronic)   Fairly well controlled blood pressure today.  She has spent the last 2 days driving home from [place]. Current regimen is Bystolic  DAW.  Will continue the same regimen. No medication side effects noted.        Relevant Medications   BYSTOLIC  5 MG tablet   Vitamin D  deficiency - Primary   Supplemented with weekly high dose.      Relevant Medications   Vitamin D , Ergocalciferol , (DRISDOL ) 1.25 MG (50000 UNIT) CAPS capsule   Other Visit Diagnoses       Colon cancer screening       due to repeat Cologuard testing this month   Relevant Orders   Cologuard     Need for viral immunization       Relevant Medications   COVID-19 mRNA vaccine, Pfizer, (COMIRNATY) syringe     Encounter for immunization       Relevant Orders   Flu vaccine HIGH DOSE PF(Fluzone Trivalent) (Completed)       No follow-ups on file.    Leita HILARIO Adie, MD [facility] [place]

## 2024-02-05 ENCOUNTER — Other Ambulatory Visit: Payer: Self-pay | Admitting: Internal Medicine

## 2024-02-05 DIAGNOSIS — I10 Essential (primary) hypertension: Secondary | ICD-10-CM

## 2024-02-06 ENCOUNTER — Emergency Department
Admission: EM | Admit: 2024-02-06 | Discharge: 2024-02-06 | Disposition: A | Discharge status: Home / Self Care | Attending: Emergency Medicine | Admitting: Emergency Medicine

## 2024-02-06 ENCOUNTER — Encounter: Payer: Self-pay | Admitting: Emergency Medicine

## 2024-02-06 ENCOUNTER — Other Ambulatory Visit: Payer: Self-pay

## 2024-02-06 DIAGNOSIS — I10 Essential (primary) hypertension: Secondary | ICD-10-CM | POA: Insufficient documentation

## 2024-02-06 DIAGNOSIS — K625 Hemorrhage of anus and rectum: Secondary | ICD-10-CM | POA: Diagnosis present

## 2024-02-06 DIAGNOSIS — K644 Residual hemorrhoidal skin tags: Secondary | ICD-10-CM | POA: Diagnosis not present

## 2024-02-06 LAB — COMPREHENSIVE METABOLIC PANEL WITH GFR
ALT: 18 U/L (ref 0–44)
AST: 26 U/L (ref 15–41)
Albumin: 3.9 g/dL (ref 3.5–5.0)
Alkaline Phosphatase: 76 U/L (ref 38–126)
Anion gap: 14 (ref 5–15)
BUN: 21 mg/dL (ref 8–23)
CO2: 23 mmol/L (ref 22–32)
Calcium: 9.6 mg/dL (ref 8.9–10.3)
Chloride: 104 mmol/L (ref 98–111)
Creatinine, Ser: 1.24 mg/dL — ABNORMAL HIGH (ref 0.44–1.00)
GFR, Estimated: 47 mL/min — ABNORMAL LOW (ref >60)
Glucose, Bld: 115 mg/dL — ABNORMAL HIGH (ref 70–99)
Potassium: 4.1 mmol/L (ref 3.5–5.1)
Sodium: 141 mmol/L (ref 135–145)
Total Bilirubin: 0.4 mg/dL (ref 0.0–1.2)
Total Protein: 8.6 g/dL — ABNORMAL HIGH (ref 6.5–8.1)

## 2024-02-06 LAB — CBC
HCT: 41.6 % (ref 36.0–46.0)
Hemoglobin: 13.5 g/dL (ref 12.0–15.0)
MCH: 30.4 pg (ref 26.0–34.0)
MCHC: 32.5 g/dL (ref 30.0–36.0)
MCV: 93.7 fL (ref 80.0–100.0)
Platelets: 247 K/uL (ref 150–400)
RBC: 4.44 MIL/uL (ref 3.87–5.11)
RDW: 13.2 % (ref 11.5–15.5)
WBC: 10.4 K/uL (ref 4.0–10.5)
nRBC: 0 % (ref 0.0–0.2)

## 2024-02-06 NOTE — ED Provider Notes (Signed)
 [facility] Provider Note    Event Date/Time   First MD Initiated Contact with Patient 02/06/24 1650     (approximate)   History   Chief Complaint: Rectal Bleeding   HPI  Courtney Robinson is a 68 y.o. female with a history of hypertension, morbid obesity who comes ED due to rectal bleeding that started around 1:30 PM today.  Patient was having a massage at the time, felt like she needed to have a bowel movement and in the bathroom passed stool and fresh red blood.  Denies pain.  No abdominal pain or fever.  Has not had any repeat episodes of passing blood.  Does not take blood thinners.  Does note a history of diverticulosis.  Since being in the ED, patient did feel like she needed to have a bowel movement, but when she went there was no stool or bleeding.      Past Medical History:  Diagnosis Date   Acute bacterial rhinosinusitis 02/08/2022   Allergy    Anemia    Displacement of intervertebral disc, site unspecified, without myelopathy    Fibroadenoma of breast    right   History of Lyme disease 2006/2007   Hypertension 2019   Bystolic  5 MG tablet 1 per day   Spinal stenosis, lumbar region, without neurogenic claudication    Vertigo     Current Outpatient Rx   Order #: [id number] Class: Historical Med   Order #: [id number] Class: Normal   Order #: [id number] Class: Normal   Order #: [id number] Class: Historical Med   Order #: [id number] Class: Historical Med   Order #: [id number] Class: Historical Med   Order #: [id number] Class: Historical Med   Order #: [id number] Class: Historical Med   Order #: [id number] Class: Normal   Order #: [id number] Class: Normal    Past Surgical History:  Procedure Laterality Date   CHOLECYSTECTOMY  2013   ENDOMETRIAL BIOPSY  11/16/09   Disordered Proliferative Endometrium, negative for hyperplasia, atypia or malignancy    Physical Exam   Triage Vital Signs: ED Triage Vitals  Encounter Vitals Group     BP  02/06/24 1410 (!) 173/84     Girls Systolic BP Percentile --      Girls Diastolic BP Percentile --      Boys Systolic BP Percentile --      Boys Diastolic BP Percentile --      Pulse Rate 02/06/24 1410 72     Resp 02/06/24 1410 17     Temp 02/06/24 1410 97.9 F (36.6 C)     Temp Source 02/06/24 1410 Oral     SpO2 02/06/24 1410 97 %     Weight 02/06/24 1409 285 lb (129.3 kg)     Height 02/06/24 1409 5' 8 (1.727 m)     Head Circumference --      Peak Flow --      Pain Score 02/06/24 1409 0     Pain Loc --      Pain Education --      Exclude from Growth Chart --     Most recent vital signs: Vitals:   02/06/24 1410 02/06/24 1658  BP: (!) 173/84   Pulse: 72   Resp: 17   Temp: 97.9 F (36.6 C)   SpO2: 97% 97%    General: Awake, no distress.  CV:  Good peripheral perfusion.  Regular rate rhythm Resp:  Normal effort.  Abd:  No distention.  Soft nontender.  External anal exam performed with an intact,  shows effaced external hemorrhoid is likely source of recent bleeding.  No ongoing bleeding. Other:     ED Results / Procedures / Treatments   Labs (all labs ordered are listed, but only abnormal results are displayed) Labs Reviewed  COMPREHENSIVE METABOLIC PANEL WITH GFR - Abnormal; Notable for the following components:      Result Value   Glucose, Bld 115 (*)    Creatinine, Ser 1.24 (*)    Total Protein 8.6 (*)    GFR, Estimated 47 (*)    All other components within normal limits  CBC  POC OCCULT BLOOD, ED  TYPE AND SCREEN     EKG    RADIOLOGY    PROCEDURES:  Procedures   MEDICATIONS ORDERED IN ED: Medications - No data to display   IMPRESSION / MDM / ASSESSMENT AND PLAN / ED COURSE  I reviewed the triage vital signs and the nursing notes.  DDx: Bleeding hemorrhoid, anemia, AKI, anal fissure, diverticular bleed  Patient's presentation is most consistent with acute presentation with potential threat to life or bodily function.  Patient presents  with 1 episode of rectal bleeding with a bowel movement.  Medical history, labs, exam are all suggestive of ruptured/bleeding hemorrhoid.  No ongoing bleeding, stable for discharge       FINAL CLINICAL IMPRESSION(S) / ED DIAGNOSES   Final diagnoses:  External hemorrhoid, bleeding     Rx / DC Orders   ED Discharge Orders     None        Note:  This document was prepared using Dragon voice recognition software and may include unintentional dictation errors.   Viviann Pastor, MD 02/06/24 [phone]

## 2024-02-06 NOTE — ED Triage Notes (Signed)
 Patient to ED via POV for rectal bleeding. PT reports it started approx 30 minutes ago. States bright red blood in the toilet bowel. States occurred before in 2011.

## 2024-02-07 ENCOUNTER — Inpatient Hospital Stay: Admitting: Internal Medicine

## 2024-02-07 ENCOUNTER — Encounter: Payer: Self-pay | Admitting: Internal Medicine

## 2024-02-07 ENCOUNTER — Other Ambulatory Visit: Payer: Self-pay

## 2024-02-07 ENCOUNTER — Telehealth: Payer: Self-pay | Admitting: Internal Medicine

## 2024-02-07 ENCOUNTER — Telehealth: Payer: Self-pay

## 2024-02-07 DIAGNOSIS — K649 Unspecified hemorrhoids: Secondary | ICD-10-CM

## 2024-02-07 DIAGNOSIS — K625 Hemorrhage of anus and rectum: Secondary | ICD-10-CM

## 2024-02-07 DIAGNOSIS — K6289 Other specified diseases of anus and rectum: Secondary | ICD-10-CM

## 2024-02-07 LAB — TYPE AND SCREEN
ABO/RH(D): O POS
Antibody Screen: NEGATIVE

## 2024-02-07 NOTE — Telephone Encounter (Signed)
 Requested medication (s) are due for refill today: review  Requested medication (s) are on the active medication list: yes  Last refill:  02/03/24  Future visit scheduled: yes  Notes to clinic:  Pharmacy comment: CLARIFY-Patient has indicated to us  that they are either allergic or sensitive to BETA-ADRENERGIC BLOCKERS. There is possible cross-allergy. May we dispense? Please respond with appropriate changes or comment to Pharmacy.      Requested Prescriptions  Pending Prescriptions Disp Refills   BYSTOLIC  5 MG tablet [Pharmacy Med Name: BYSTOLIC  TAB 5MG ]  1    Sig: TAKE 1 TABLET DAILY.     Cardiovascular: Beta Blockers 3 Failed - 02/07/2024 12:48 PM      Failed - Cr in normal range and within 360 days    Creatinine  Date Value Ref Range Status  11/20/2011 1.10 0.60 - 1.30 mg/dL Final   Creatinine, Ser  Date Value Ref Range Status  02/06/2024 1.24 (H) 0.44 - 1.00 mg/dL Final         Failed - Last BP in normal range    BP Readings from Last 1 Encounters:  02/06/24 (!) 173/84         Failed - Valid encounter within last 6 months    Recent Outpatient Visits           4 days ago Vitamin D  deficiency   [facility] at [facility], Leita DEL, MD   4 months ago Pain in both hands   [place] [facility] at [facility], Leita DEL, MD   5 months ago Numbness and tingling in both hands   [facility] Primary Care & Sports Medicine at [facility], Leita DEL, MD              Passed - AST in normal range and within 360 days    AST  Date Value Ref Range Status  02/06/2024 26 15 - 41 U/L Final   SGOT(AST)  Date Value Ref Range Status  05/28/2011 141 (H) 15 - 37 Unit/L Final         Passed - ALT in normal range and within 360 days    ALT  Date Value Ref Range Status  02/06/2024 18 0 - 44 U/L Final   SGPT (ALT)  Date Value Ref Range Status  05/28/2011 69 U/L Final    Comment:     12-78 NOTE: NEW REFERENCE RANGE 03/02/2011          Passed - Last Heart Rate in normal range    Pulse Readings from Last 1 Encounters:  02/06/24 72

## 2024-02-07 NOTE — Telephone Encounter (Signed)
 Copied from CRM #[id number]. Topic: Clinical - Medication Question >> Feb 07, 2024 11:34 AM Travis F wrote: Reason for CRM: Lucie with [facility] is calling requesting clarification on the prescription BYSTOLIC  5 MG tablet [[id number]]. Lucie wants to know if they could dispense it as generic due to the copay cost of the brand name. Her other concern was patient being allergic to beta blockers and wanted to know if they should continue to dispense. Lucie is requesting a call back, she can be reached at  [phone] 2, reference #: [id number].

## 2024-02-07 NOTE — Transitions of Care (Post Inpatient/ED Visit) (Signed)
   02/07/2024  Name: Courtney Robinson MRN: [id number] DOB: 10/08/1955  Today's TOC FU Call Status: Today's TOC FU Call Status:: Successful TOC FU Call Completed TOC FU Call Complete Date: 02/07/24 Patient's Name and Date of Birth confirmed.  Transition Care Management Follow-up Telephone Call Date of Discharge: 02/07/24 Discharge Facility: [facility]) Type of Discharge: Emergency Department Reason for ED Visit: Other: (Hemorrhoid) How have you been since you were released from the hospital?: Better Any questions or concerns?: No  Items Reviewed: Did you receive and understand the discharge instructions provided?: No Medications obtained,verified, and reconciled?: Yes (Medications Reviewed) Any new allergies since your discharge?: No Dietary orders reviewed?: NA Do you have support at home?: Yes People in Home [RPT]: spouse  Medications Reviewed Today: Medications Reviewed Today     Reviewed by Valton Schwartz N, CMA (Certified Medical Assistant) on 02/07/24 at [phone]  Med List Status: <None>   Medication Order Taking? Sig Documenting Provider Last Dose Status Informant  Ascorbic Acid (VITAMIN C) 1000 MG tablet [id number] Yes Take 1,000 mg by mouth daily. [provider]  Active   BYSTOLIC  5 MG tablet [id number] Yes Take 1 tablet (5 mg total) by mouth daily. Justus Leita DEL, MD  Active   cyclobenzaprine  (FLEXERIL ) 10 MG tablet [id number] Yes Take 1 tablet (10 mg total) by mouth at bedtime. As needed for jaw pain Justus Leita DEL, MD  Active   [facility] POWD [id number] Yes Take by mouth 3 (three) times a week. [provider]  Active   dicyclomine (BENTYL) 20 MG tablet [id number] Yes Take by mouth. [provider]  Active   UNABLE TO FIND [id number] Yes A-F Betafood [provider]  Active   UNABLE TO FIND [id number] Yes Med Name: Verlon urinary care supplement - [provider]  Active   UNABLE TO FIND  [id number] Yes Med Name: General female endocrine pack pills - 1 black currant seed oil, 2 catalyn, 1 hypothalamus PMG, 1 symplex F, 2 tuna omega-3 oil [provider]  Active   valACYclovir  (VALTREX ) 1000 MG tablet [id number] Yes Take 1 tablet (1,000 mg total) by mouth 2 (two) times daily. 2 tabs every 12 hours for 2 doses as needed Berglund, Laura H, MD  Active   Vitamin D , Ergocalciferol , (DRISDOL ) 1.25 MG (50000 UNIT) CAPS capsule [id number] Yes Take 1 capsule (50,000 Units total) by mouth every 7 (seven) days. Justus Leita DEL, MD  Active             Home Care and Equipment/Supplies: Were Home Health Services Ordered?: NA Any new equipment or medical supplies ordered?: NA  Functional Questionnaire: Do you need assistance with bathing/showering or dressing?: No Do you need assistance with meal preparation?: No Do you need assistance with eating?: No Do you have difficulty maintaining continence: No Do you need assistance with getting out of bed/getting out of a chair/moving?: No Do you have difficulty managing or taking your medications?: No  Follow up appointments reviewed: PCP Follow-up appointment confirmed?: NA Specialist Hospital Follow-up appointment confirmed?: NA Do you need transportation to your follow-up appointment?: No Do you understand care options if your condition(s) worsen?: Yes-patient verbalized understanding    Ramsha Lonigro Martin  Primary Care & Sports Medicine [facility] [place] CMA [facility]), [facility] [address]  [place] [address] Office [phone]  Fax: [phone]

## 2024-02-07 NOTE — Telephone Encounter (Signed)
 Sent the refill to Dr Justus for review.

## 2024-02-11 ENCOUNTER — Encounter: Payer: Self-pay | Admitting: Internal Medicine

## 2024-02-11 ENCOUNTER — Telehealth: Payer: Self-pay

## 2024-02-11 ENCOUNTER — Other Ambulatory Visit: Payer: Self-pay | Admitting: Internal Medicine

## 2024-02-11 ENCOUNTER — Other Ambulatory Visit: Payer: Self-pay

## 2024-02-11 MED ORDER — NEBIVOLOL HCL 5 MG PO TABS: 5.0000 mg | ORAL_TABLET | Freq: Every day | ORAL | 1 refills | Status: DC

## 2024-02-11 NOTE — Telephone Encounter (Signed)
 Copied from CRM [id number]. Topic: Referral - Status >> Feb 11, 2024  3:46 PM Harlene ORN wrote: Reason for CRM: [facility]  Received a referral for hemmeroids. They do not do hemmeroids, they would be referred to general surgery or a clinic in [facility] Phone: [phone] Fax: [phone] [facility] Phone: [phone]

## 2024-02-13 NOTE — Telephone Encounter (Signed)
 Requested medication (s) are due for refill today: no  Requested medication (s) are on the active medication list: yes  Last refill:  02/12/24  Future visit scheduled: yes  Notes to clinic:    Pharmacy comment: Patient has indicated to us  that they are either allergic or sensitive to Beta Blockers. There is possible cross-allergy. May we dispense? Please respond with appropriate changes or comment to Pharmacy.       Requested Prescriptions  Pending Prescriptions Disp Refills   nebivolol  (BYSTOLIC ) 5 MG tablet [Pharmacy Med Name: NEBIVOLOL  TAB 5MG ]  1    Sig: TAKE 1 TABLET DAILY.     Cardiovascular: Beta Blockers 3 Failed - 02/13/2024  1:37 PM      Failed - Cr in normal range and within 360 days    Creatinine  Date Value Ref Range Status  11/20/2011 1.10 0.60 - 1.30 mg/dL Final   Creatinine, Ser  Date Value Ref Range Status  02/06/2024 1.24 (H) 0.44 - 1.00 mg/dL Final         Failed - Last BP in normal range    BP Readings from Last 1 Encounters:  02/06/24 (!) 173/84         Failed - Valid encounter within last 6 months    Recent Outpatient Visits           1 week ago Vitamin D  deficiency   [facility] Primary Care & Sports Medicine at [facility], Leita DEL, MD   4 months ago Pain in both hands   [place] [facility] at [facility], Leita DEL, MD   5 months ago Numbness and tingling in both hands   [facility] at [facility], Leita DEL, MD              Passed - AST in normal range and within 360 days    AST  Date Value Ref Range Status  02/06/2024 26 15 - 41 U/L Final   SGOT(AST)  Date Value Ref Range Status  05/28/2011 141 (H) 15 - 37 Unit/L Final         Passed - ALT in normal range and within 360 days    ALT  Date Value Ref Range Status  02/06/2024 18 0 - 44 U/L Final   SGPT (ALT)  Date Value Ref Range Status  05/28/2011 69 U/L Final    Comment:     12-78 NOTE: NEW REFERENCE RANGE 03/02/2011          Passed - Last Heart Rate in normal range    Pulse Readings from Last 1 Encounters:  02/06/24 72

## 2024-03-04 ENCOUNTER — Encounter: Payer: Self-pay | Admitting: Internal Medicine

## 2024-03-04 ENCOUNTER — Ambulatory Visit: Admitting: Internal Medicine

## 2024-03-04 VITALS — BP 122/84 | HR 64 | Ht 68.0 in

## 2024-03-04 DIAGNOSIS — M109 Gout, unspecified: Secondary | ICD-10-CM | POA: Diagnosis not present

## 2024-03-04 NOTE — Progress Notes (Signed)
 Date:  03/04/2024   Name:  Courtney Robinson   DOB:  [birth year]   MRN:  [id number]   Chief Complaint: Gout (Gout flare up since Monday evening. Patient went to [facility] yesterday where she had xray's. Symptoms Feet swelling, redness, and pain. Patient started taking Colchicine which has improved her pain. She would like a Uric acid blood draw to confirm gout flare up. )  Toe Pain  The incident occurred 2 days ago. There was no injury mechanism. The pain is present in the left toes.    Review of Systems  Constitutional:  Negative for chills, fatigue, fever and unexpected weight change.  HENT:  Negative for trouble swallowing.   Eyes:  Negative for visual disturbance.  Respiratory:  Negative for cough, chest tightness, shortness of breath and wheezing.   Cardiovascular:  Negative for chest pain, palpitations and leg swelling.  Gastrointestinal:  Positive for nausea. Negative for abdominal pain, constipation and diarrhea.  Musculoskeletal:  Positive for gait problem and joint swelling. Negative for arthralgias and myalgias.  Neurological:  Negative for dizziness, weakness, light-headedness and headaches.     Lab Results  Component Value Date   NA 141 02/06/2024   K 4.1 02/06/2024   CO2 23 02/06/2024   GLUCOSE 115 (H) 02/06/2024   BUN 21 02/06/2024   CREATININE 1.24 (H) 02/06/2024   CALCIUM 9.6 02/06/2024   EGFR 54 (L) 08/28/2023   GFRNONAA 47 (L) 02/06/2024   Lab Results  Component Value Date   CHOL 204 (H) 10/02/2022   HDL 52 10/02/2022   LDLCALC 122 (H) 10/02/2022   TRIG 173 (H) 10/02/2022   CHOLHDL 3.9 10/02/2022   Lab Results  Component Value Date   TSH 1.780 08/28/2023   Lab Results  Component Value Date   HGBA1C 6.2 (H) 08/28/2023   Lab Results  Component Value Date   WBC 10.4 02/06/2024   HGB 13.5 02/06/2024   HCT 41.6 02/06/2024   MCV 93.7 02/06/2024   PLT 247 02/06/2024   Lab Results  Component Value Date   ALT 18 02/06/2024   AST 26  02/06/2024   ALKPHOS 76 02/06/2024   BILITOT 0.4 02/06/2024   Lab Results  Component Value Date   VD25OH 39.9 08/28/2023     Patient Active Problem List   Diagnosis Date Noted   Gouty arthritis of left great toe 03/04/2024   Vitamin D  deficiency 08/28/2023   Osteopenia 03/28/2022   HSV (herpes simplex virus) infection 12/15/2019   Rectocele 07/27/2019   Arthralgia of left temporomandibular joint 10/15/2017   Pre-diabetes 08/16/2017   Environmental and seasonal allergies 07/20/2016   Left knee pain 08/12/2015   History of palpitations 08/12/2015   Essential (primary) hypertension 01/17/2015   Arthralgia of hand 01/17/2015   Adult BMI 40.0-44.9 kg/sq m (HCC) 12/30/2009   Mixed hyperlipidemia 03/25/2009    Allergies  Allergen Reactions   Hydrochlorothiazide  Shortness Of Breath, Swelling and Other (See Comments)    Patient states throat swelled up, felt SOB, chest pain, and tingling in arms and shoulders.   Amlodipine    Hydrocodone    Macrobid [Nitrofurantoin] Swelling    Lip swelling, lip numbness, shortness of breath   Atenolol Other (See Comments)    Side Effects   Codeine  Nausea And Vomiting   Lisinopril  Cough   Loratadine Palpitations   Metoprolol Other (See Comments)    bradycardia   Olmesartan Rash    Past Surgical History:  Procedure Laterality Date   CHOLECYSTECTOMY  2013   ENDOMETRIAL BIOPSY  11/16/09   Disordered Proliferative Endometrium, negative for hyperplasia, atypia or malignancy    Social History   Tobacco Use   Smoking status: Never   Smokeless tobacco: Never  Vaping Use   Vaping status: Never Used  Substance Use Topics   Alcohol use: No    Alcohol/week: 0.0 standard drinks of alcohol   Drug use: No     Medication list has been reviewed and updated.  Current Meds  Medication Sig   Ascorbic Acid (VITAMIN C) 1000 MG tablet Take 1,000 mg by mouth daily.   colchicine 0.6 MG tablet Take 0.6 mg by mouth 2 (two) times daily.    cyclobenzaprine  (FLEXERIL ) 10 MG tablet Take 1 tablet (10 mg total) by mouth at bedtime. As needed for jaw pain   D-Mannose POWD Take by mouth 3 (three) times a week.   dicyclomine (BENTYL) 20 MG tablet Take by mouth.   nebivolol  (BYSTOLIC ) 5 MG tablet TAKE 1 TABLET DAILY.   Specialty Vitamins Products (NERVIVE NERVE RELIEF PO) Take by mouth.   UNABLE TO FIND A-F Betafood   UNABLE TO FIND Med Name: Verlon urinary care supplement -   UNABLE TO FIND Med Name: General female endocrine pack pills - 1 black currant seed oil, 2 catalyn, 1 hypothalamus PMG, 1 symplex F, 2 tuna omega-3 oil   valACYclovir  (VALTREX ) 1000 MG tablet Take 1 tablet (1,000 mg total) by mouth 2 (two) times daily. 2 tabs every 12 hours for 2 doses as needed   Vitamin D , Ergocalciferol , (DRISDOL ) 1.25 MG (50000 UNIT) CAPS capsule Take 1 capsule (50,000 Units total) by mouth every 7 (seven) days.       02/03/2024    2:41 PM 10/08/2023    3:17 PM 08/28/2023   10:01 AM 03/28/2023   11:34 AM  GAD 7 : Generalized Anxiety Score  Nervous, Anxious, on Edge 0 0 0 0  Control/stop worrying 0 0 0 0  Worry too much - different things 0 0 0 0  Trouble relaxing 0 0 0 0  Restless 0 0 0 0  Easily annoyed or irritable 0 0 0 0  Afraid - awful might happen 0 0 0 0  Total GAD 7 Score 0 0 0 0  Anxiety Difficulty Not difficult at all Not difficult at all Not difficult at all Not difficult at all       03/04/2024    1:11 PM 02/03/2024    2:41 PM 10/08/2023    3:17 PM  Depression screen PHQ 2/9  Decreased Interest 0 0 0  Down, Depressed, Hopeless 0 0 0  PHQ - 2 Score 0 0 0  Altered sleeping  0 0  Tired, decreased energy  0 0  Change in appetite  0 0  Feeling bad or failure about yourself   0 0  Trouble concentrating  0 0  Moving slowly or fidgety/restless  0 0  Suicidal thoughts  0 0  PHQ-9 Score  0  0   Difficult doing work/chores  Not difficult at all Not difficult at all     Data saved with a previous flowsheet row definition     BP Readings from Last 3 Encounters:  03/04/24 122/84  02/06/24 (!) 173/84  02/03/24 (!) 142/84    Physical Exam Vitals and nursing note reviewed.  Constitutional:      General: She is not in acute distress.    Appearance: She is well-developed.  HENT:  Head: Normocephalic and atraumatic.  Pulmonary:     Effort: Pulmonary effort is normal. No respiratory distress.  Musculoskeletal:        General: Tenderness present.     Left lower leg: Edema present.  Skin:    General: Skin is warm and dry.     Findings: No rash.  Neurological:     Mental Status: She is alert and oriented to person, place, and time.  Psychiatric:        Mood and Affect: Mood normal.        Behavior: Behavior normal.     Wt Readings from Last 3 Encounters:  02/06/24 285 lb (129.3 kg)  02/03/24 293 lb (132.9 kg)  10/08/23 293 lb (132.9 kg)    BP 122/84   Pulse 64   Ht 5' 8 (1.727 m)   SpO2 96%   BMI 43.33 kg/m   Assessment and Plan:  Problem List Items Addressed This Visit       Unprioritized   Gouty arthritis of left great toe - Primary   Now on Colchicine from [facility]. She is much better after 2 doses. Can continue up to 5 days.  Also elevate and use ice. Discussed foods to avoid; no need for uric acid level unless gout is recurrent      Relevant Medications   colchicine 0.6 MG tablet    No follow-ups on file.    Leita HILARIO Adie, MD [facility] Primary Care and Sports Medicine [place]

## 2024-03-04 NOTE — Assessment & Plan Note (Addendum)
 Now on Colchicine from [facility]. She is much better after 2 doses. Can continue up to 5 days.  Also elevate and use ice. Discussed foods to avoid; no need for uric acid level unless gout is recurrent

## 2024-03-09 ENCOUNTER — Other Ambulatory Visit: Payer: Self-pay | Admitting: Internal Medicine

## 2024-03-09 ENCOUNTER — Encounter: Payer: Self-pay | Admitting: Internal Medicine

## 2024-03-09 DIAGNOSIS — M109 Gout, unspecified: Secondary | ICD-10-CM

## 2024-03-09 MED ORDER — COLCHICINE 0.6 MG PO TABS: 0.6000 mg | ORAL_TABLET | Freq: Two times a day (BID) | ORAL | 0 refills | Status: DC

## 2024-03-09 NOTE — Progress Notes (Unsigned)
 Date:  03/09/2024   Name:  Jul Mar Dallas Scorsone   DOB:  1955-04-26   MRN:  [id number]   Chief Complaint: No chief complaint on file.  HPI  Review of Systems   Lab Results  Component Value Date   NA 141 02/06/2024   K 4.1 02/06/2024   CO2 23 02/06/2024   GLUCOSE 115 (H) 02/06/2024   BUN 21 02/06/2024   CREATININE 1.24 (H) 02/06/2024   CALCIUM 9.6 02/06/2024   EGFR 54 (L) 08/28/2023   GFRNONAA 47 (L) 02/06/2024   Lab Results  Component Value Date   CHOL 204 (H) 10/02/2022   HDL 52 10/02/2022   LDLCALC 122 (H) 10/02/2022   TRIG 173 (H) 10/02/2022   CHOLHDL 3.9 10/02/2022   Lab Results  Component Value Date   TSH 1.780 08/28/2023   Lab Results  Component Value Date   HGBA1C 6.2 (H) 08/28/2023   Lab Results  Component Value Date   WBC 10.4 02/06/2024   HGB 13.5 02/06/2024   HCT 41.6 02/06/2024   MCV 93.7 02/06/2024   PLT 247 02/06/2024   Lab Results  Component Value Date   ALT 18 02/06/2024   AST 26 02/06/2024   ALKPHOS 76 02/06/2024   BILITOT 0.4 02/06/2024   Lab Results  Component Value Date   VD25OH 39.9 08/28/2023     Patient Active Problem List   Diagnosis Date Noted   Gouty arthritis of left great toe 03/04/2024   Vitamin D  deficiency 08/28/2023   Osteopenia 03/28/2022   HSV (herpes simplex virus) infection 12/15/2019   Rectocele 07/27/2019   Arthralgia of left temporomandibular joint 10/15/2017   Pre-diabetes 08/16/2017   Environmental and seasonal allergies 07/20/2016   Left knee pain 08/12/2015   History of palpitations 08/12/2015   Essential (primary) hypertension 01/17/2015   Arthralgia of hand 01/17/2015   Adult BMI 40.0-44.9 kg/sq m (HCC) 12/30/2009   Mixed hyperlipidemia 03/25/2009    Allergies  Allergen Reactions   Hydrochlorothiazide  Shortness Of Breath, Swelling and Other (See Comments)    Patient states throat swelled up, felt SOB, chest pain, and tingling in arms and shoulders.   Amlodipine    Hydrocodone     Macrobid [Nitrofurantoin] Swelling    Lip swelling, lip numbness, shortness of breath   Atenolol Other (See Comments)    Side Effects   Codeine  Nausea And Vomiting   Lisinopril  Cough   Loratadine Palpitations   Metoprolol Other (See Comments)    bradycardia   Olmesartan Rash    Past Surgical History:  Procedure Laterality Date   CHOLECYSTECTOMY  2013   ENDOMETRIAL BIOPSY  11/16/09   Disordered Proliferative Endometrium, negative for hyperplasia, atypia or malignancy    Social History   Tobacco Use   Smoking status: Never   Smokeless tobacco: Never  Vaping Use   Vaping status: Never Used  Substance Use Topics   Alcohol use: No    Alcohol/week: 0.0 standard drinks of alcohol   Drug use: No     Medication list has been reviewed and updated.  No outpatient medications have been marked as taking for the 03/09/24 encounter (Orders Only) with Justus Leita DEL, MD.       02/03/2024    2:41 PM 10/08/2023    3:17 PM 08/28/2023   10:01 AM 03/28/2023   11:34 AM  GAD 7 : Generalized Anxiety Score  Nervous, Anxious, on Edge 0 0 0 0  Control/stop worrying 0 0 0 0  Worry too  much - different things 0 0 0 0  Trouble relaxing 0 0 0 0  Restless 0 0 0 0  Easily annoyed or irritable 0 0 0 0  Afraid - awful might happen 0 0 0 0  Total GAD 7 Score 0 0 0 0  Anxiety Difficulty Not difficult at all Not difficult at all Not difficult at all Not difficult at all       03/04/2024    1:11 PM 02/03/2024    2:41 PM 10/08/2023    3:17 PM  Depression screen PHQ 2/9  Decreased Interest 0 0 0  Down, Depressed, Hopeless 0 0 0  PHQ - 2 Score 0 0 0  Altered sleeping  0 0  Tired, decreased energy  0 0  Change in appetite  0 0  Feeling bad or failure about yourself   0 0  Trouble concentrating  0 0  Moving slowly or fidgety/restless  0 0  Suicidal thoughts  0 0  PHQ-9 Score  0  0   Difficult doing work/chores  Not difficult at all Not difficult at all     Data saved with a previous  flowsheet row definition    BP Readings from Last 3 Encounters:  03/04/24 122/84  02/06/24 (!) 173/84  02/03/24 (!) 142/84    Physical Exam  Wt Readings from Last 3 Encounters:  02/06/24 285 lb (129.3 kg)  02/03/24 293 lb (132.9 kg)  10/08/23 293 lb (132.9 kg)    There were no vitals taken for this visit.  Assessment and Plan:  Problem List Items Addressed This Visit   None   No follow-ups on file.    Leita HILARIO Adie, MD [facility] Primary Care and Sports Medicine [place]

## 2024-03-09 NOTE — Telephone Encounter (Signed)
 Please review.  KP

## 2024-03-11 ENCOUNTER — Ambulatory Visit: Payer: Self-pay | Admitting: Internal Medicine

## 2024-03-11 LAB — COLOGUARD: COLOGUARD: NEGATIVE

## 2024-03-16 ENCOUNTER — Other Ambulatory Visit: Payer: Self-pay | Admitting: Internal Medicine

## 2024-03-16 DIAGNOSIS — M109 Gout, unspecified: Secondary | ICD-10-CM

## 2024-03-18 ENCOUNTER — Other Ambulatory Visit: Payer: Self-pay

## 2024-03-18 ENCOUNTER — Telehealth: Payer: Self-pay | Admitting: Internal Medicine

## 2024-03-18 ENCOUNTER — Encounter: Payer: Self-pay | Admitting: Internal Medicine

## 2024-03-18 DIAGNOSIS — S0300XD Dislocation of jaw, unspecified side, subsequent encounter: Secondary | ICD-10-CM

## 2024-03-18 NOTE — Telephone Encounter (Signed)
 Requested medication (s) are due for refill today - unsure  Requested medication (s) are on the active medication list -yes  Future visit scheduled -no  Last refill: 03/09/24 #30  Notes to clinic: acute visit Rx- request for RF 90 day supply- Rx does not cover this amount  Requested Prescriptions  Pending Prescriptions Disp Refills   colchicine  0.6 MG tablet [Pharmacy Med Name: COLCHICINE  0.6 MG TABLET] 180 tablet 1    Sig: TAKE 1 TABLET BY MOUTH 2 TIMES DAILY.     Endocrinology:  Gout Agents - colchicine  Failed - 03/18/2024  2:10 PM      Failed - Cr in normal range and within 360 days    Creatinine  Date Value Ref Range Status  11/20/2011 1.10 0.60 - 1.30 mg/dL Final   Creatinine, Ser  Date Value Ref Range Status  02/06/2024 1.24 (H) 0.44 - 1.00 mg/dL Final         Failed - CBC within normal limits and completed in the last 12 months    WBC  Date Value Ref Range Status  02/06/2024 10.4 4.0 - 10.5 K/uL Final   RBC  Date Value Ref Range Status  02/06/2024 4.44 3.87 - 5.11 MIL/uL Final   Hemoglobin  Date Value Ref Range Status  02/06/2024 13.5 12.0 - 15.0 g/dL Final  93/74/7975 85.9 11.1 - 15.9 g/dL Final   HCT  Date Value Ref Range Status  02/06/2024 41.6 36.0 - 46.0 % Final   Hematocrit  Date Value Ref Range Status  10/02/2022 41.4 34.0 - 46.6 % Final   MCHC  Date Value Ref Range Status  02/06/2024 32.5 30.0 - 36.0 g/dL Final   [facility]  Date Value Ref Range Status  02/06/2024 30.4 26.0 - 34.0 pg Final   MCV  Date Value Ref Range Status  02/06/2024 93.7 80.0 - 100.0 fL Final  10/02/2022 90 79 - 97 fL Final  11/20/2011 89 80 - 100 fL Final   No results found for: PLTCOUNTKUC, LABPLAT, POCPLA RDW  Date Value Ref Range Status  02/06/2024 13.2 11.5 - 15.5 % Final  10/02/2022 13.2 11.7 - 15.4 % Final  11/20/2011 13.9 11.5 - 14.5 % Final         Passed - ALT in normal range and within 360 days    ALT  Date Value Ref Range Status  02/06/2024 18 0 -  44 U/L Final   SGPT (ALT)  Date Value Ref Range Status  05/28/2011 69 U/L Final    Comment:    12-78 NOTE: NEW REFERENCE RANGE 03/02/2011          Passed - AST in normal range and within 360 days    AST  Date Value Ref Range Status  02/06/2024 26 15 - 41 U/L Final   SGOT(AST)  Date Value Ref Range Status  05/28/2011 141 (H) 15 - 37 Unit/L Final         Passed - Valid encounter within last 12 months    Recent Outpatient Visits           2 weeks ago Gouty arthritis of left great toe   [place] [facility] at [facility], Leita DEL, MD   1 month ago Vitamin D  deficiency   [facility] at [facility], Leita DEL, MD   5 months ago Pain in both hands   [facility] at [facility], Leita  H, MD   6 months ago Numbness and tingling in both hands   [facility] at [facility], Leita DEL, MD                 Requested Prescriptions  Pending Prescriptions Disp Refills   colchicine  0.6 MG tablet [Pharmacy Med Name: COLCHICINE  0.6 MG TABLET] 180 tablet 1    Sig: TAKE 1 TABLET BY MOUTH 2 TIMES DAILY.     Endocrinology:  Gout Agents - colchicine  Failed - 03/18/2024  2:10 PM      Failed - Cr in normal range and within 360 days    Creatinine  Date Value Ref Range Status  11/20/2011 1.10 0.60 - 1.30 mg/dL Final   Creatinine, Ser  Date Value Ref Range Status  02/06/2024 1.24 (H) 0.44 - 1.00 mg/dL Final         Failed - CBC within normal limits and completed in the last 12 months    WBC  Date Value Ref Range Status  02/06/2024 10.4 4.0 - 10.5 K/uL Final   RBC  Date Value Ref Range Status  02/06/2024 4.44 3.87 - 5.11 MIL/uL Final   Hemoglobin  Date Value Ref Range Status  02/06/2024 13.5 12.0 - 15.0 g/dL Final  93/74/7975 85.9 11.1 - 15.9 g/dL Final   HCT  Date Value Ref Range Status  02/06/2024 41.6  36.0 - 46.0 % Final   Hematocrit  Date Value Ref Range Status  10/02/2022 41.4 34.0 - 46.6 % Final   MCHC  Date Value Ref Range Status  02/06/2024 32.5 30.0 - 36.0 g/dL Final   [facility]  Date Value Ref Range Status  02/06/2024 30.4 26.0 - 34.0 pg Final   MCV  Date Value Ref Range Status  02/06/2024 93.7 80.0 - 100.0 fL Final  10/02/2022 90 79 - 97 fL Final  11/20/2011 89 80 - 100 fL Final   No results found for: PLTCOUNTKUC, LABPLAT, POCPLA RDW  Date Value Ref Range Status  02/06/2024 13.2 11.5 - 15.5 % Final  10/02/2022 13.2 11.7 - 15.4 % Final  11/20/2011 13.9 11.5 - 14.5 % Final         Passed - ALT in normal range and within 360 days    ALT  Date Value Ref Range Status  02/06/2024 18 0 - 44 U/L Final   SGPT (ALT)  Date Value Ref Range Status  05/28/2011 69 U/L Final    Comment:    12-78 NOTE: NEW REFERENCE RANGE 03/02/2011          Passed - AST in normal range and within 360 days    AST  Date Value Ref Range Status  02/06/2024 26 15 - 41 U/L Final   SGOT(AST)  Date Value Ref Range Status  05/28/2011 141 (H) 15 - 37 Unit/L Final         Passed - Valid encounter within last 12 months    Recent Outpatient Visits           2 weeks ago Gouty arthritis of left great toe   [place] [facility] at [facility], Leita DEL, MD   1 month ago Vitamin D  deficiency   [facility] at [facility], Leita DEL, MD   5 months ago Pain in both hands   [facility] at [facility], Leita DEL, MD   6 months  ago Numbness and tingling in both hands   [facility] at [facility], Leita DEL, MD

## 2024-03-18 NOTE — Telephone Encounter (Signed)
 Received my chart message reqeusting this. Please refer to my chart message for outcome.

## 2024-03-18 NOTE — Telephone Encounter (Signed)
 Copied from CRM #[id number]. Topic: Referral - Request for Referral >> Mar 18, 2024  8:04 AM Courtney Robinson wrote: Pt wants to be referred for Physical Therapy for TMJ treatment at the [facility] on  [address] [place], [place] [address] Best contact: [phone]  Needs urgent referral for TMJ, says she has discussed this with her PCP and has been referred here already before.

## 2024-03-19 ENCOUNTER — Ambulatory Visit

## 2024-03-23 ENCOUNTER — Ambulatory Visit: Attending: Internal Medicine

## 2024-03-23 DIAGNOSIS — S0300XD Dislocation of jaw, unspecified side, subsequent encounter: Secondary | ICD-10-CM | POA: Diagnosis not present

## 2024-03-23 DIAGNOSIS — M26623 Arthralgia of bilateral temporomandibular joint: Secondary | ICD-10-CM

## 2024-03-23 DIAGNOSIS — M542 Cervicalgia: Secondary | ICD-10-CM | POA: Insufficient documentation

## 2024-03-23 DIAGNOSIS — R293 Abnormal posture: Secondary | ICD-10-CM | POA: Insufficient documentation

## 2024-03-23 NOTE — Therapy (Signed)
 OUTPATIENT PHYSICAL THERAPY TMJ EVALUATION   Patient Name: Courtney Robinson MRN: [id number] DOB:1955-04-21, 68 y.o., female Today's Date: 03/23/2024   PT End of Session - 03/23/24 1304     Visit Number 1    Number of Visits 17    Date for Recertification  05/18/24    Authorization Type eval: 03/23/24    PT Start Time 1315    PT Stop Time 1400    PT Time Calculation (min) 45 min    Activity Tolerance Patient tolerated treatment well    Behavior During Therapy [facility] for tasks assessed/performed         Past Medical History:  Diagnosis Date   Acute bacterial rhinosinusitis 02/08/2022   Allergy    Anemia    Displacement of intervertebral disc, site unspecified, without myelopathy    Fibroadenoma of breast    right   History of Lyme disease 2006/2007   Hypertension 2019   Bystolic  5 MG tablet 1 per day   Spinal stenosis, lumbar region, without neurogenic claudication    Vertigo    Past Surgical History:  Procedure Laterality Date   CHOLECYSTECTOMY  2013   ENDOMETRIAL BIOPSY  11/16/09   Disordered Proliferative Endometrium, negative for hyperplasia, atypia or malignancy   Patient Active Problem List   Diagnosis Date Noted   Gouty arthritis of left great toe 03/04/2024   Vitamin D  deficiency 08/28/2023   Osteopenia 03/28/2022   HSV (herpes simplex virus) infection 12/15/2019   Rectocele 07/27/2019   Arthralgia of left temporomandibular joint 10/15/2017   Pre-diabetes 08/16/2017   Environmental and seasonal allergies 07/20/2016   Left knee pain 08/12/2015   History of palpitations 08/12/2015   Essential (primary) hypertension 01/17/2015   Arthralgia of hand 01/17/2015   Adult BMI 40.0-44.9 kg/sq m (HCC) 12/30/2009   Mixed hyperlipidemia 03/25/2009   PCP: Justus Leita DEL, MD  REFERRING PROVIDER: Justus Leita DEL, MD  REFERRING DIAGNOSIS: S03.00XD (ICD-10-CM) - Dislocation of temporomandibular joint, subsequent encounter   THERAPY DIAG:  Cervicalgia  RATIONALE FOR EVALUATION AND TREATMENT: Rehabilitation  ONSET DATE: Last week  FOLLOW UP APPT WITH PROVIDER: Yes, CPE on 03/31/24   SUBJECTIVE:                                                                                                                                                                                         Chief Complaint: Bilateral TMJ pain, worse on the L side;  Pertinent History TMJ pain which flared last week. It initially started in 2011, she attributes it to stress related to frequent relocation. She saw maxillofacial specialist and neurology and wanted to put her on  gabapentin. She saw an acupuncturist and also saw a chiropractor who adjusted it and the pain improved considerably. She splits her time between here and [place]. She has a massage at least twice/month at [employer] in downtown [place]. She says she has tried an oral appliance without improvement. History of bilateral tunnel. Recent history of gout.   Last Dental Examination and imaging: Filling with dentist 8 weeks ago.  Recent dental procedures or orthodontics: Yes, dental filling 8 weeks ago.  Pain location: Anterior to TMJ and radiating along the maxillary sinus region.  Pain Severity: Present: 0/10, Best: 0/10, Worst: 10/10 Pain quality: muscle pain.  Radiating pain: Yes  Numbness/Tingling: Yes, bilateral numbness in fingers, mostly digits 1-3 (Hx of bilateral carpal tunnel) Aggravating factors: stress, changes in temperature, otherwise unsure.  Easing factors: chiropractic adjustment and  Clicking, catching, or crepitus during chewing: No 24 hour pain behavior: worse when first waking and then again in the late evening  History of grinding teeth: Unsure Recent or remote jaw/face/neck trauma, injury, or pain: No History of prior physical therapy for this issue: Yes  Imaging: Yes  Progression (improving, worsening, unchanged): Improved since Friday Ear symptoms  (tinnitus, fullness, pain): No Chest pain: No Headaches/migraines: No Stress/anxiety: Yes, relocation Dominant hand: right Occupational demands: Retired Presenter, [employer]: Reading, orthoptist, dance, conversation with friends Red flags (personal history of cancer, chills/fever, night sweats, nausea, vomiting, unrelenting pain, unexplained weight gain/loss): Negative  Precautions: None  Weight Bearing Restrictions: No  Patient Goals: ***    OBJECTIVE  Patient Surveys  NDI:  NECK DISABILITY INDEX  Date: *** Score  Pain intensity {NDI-1:32931}  2. Personal care (washing, dressing, etc.) {NDI-2:32932}  3. Lifting {NDI-3:32933}  4. Reading {NDI-4:32934}  5. Headaches {NDI-5:32935}  6. Concentration {NDI-6:32936}  7. Work {NDI-7:32937}  8. Driving {WIP-1:67061}  9. Sleeping {NDI-9:32939}  10. Recreation {NDI-10:32940}  Total ***/50   Minimum Detectable Change (90% confidence): 5 points or 10% points  TMD Disability Questionnaire:  Mental Status Patient is oriented to person, place and time.  Recent memory is intact.  Remote memory is intact.  Attention span and concentration are intact.  Expressive speech is intact.  Patient's fund of knowledge is within normal limits for educational level.  Cranial Nerves Visual acuity and visual fields are intact  Extraocular muscles are intact  Facial sensation is intact bilaterally  Facial strength is intact bilaterally  Hearing is normal as tested by gross conversation Palate elevates midline, normal phonation  Shoulder shrug strength is intact  Tongue protrudes midline   MUSCULOSKELETAL: Tremor: None Bulk: Normal Tone: Normal Facial Symmetry: Face appears grossly symmetrical  Posture No gross deficits in standing or sitting posture  Cervical Screen Centralization: No centralization or peripheralization of symptoms with repeated cervical protraction and retraction.  AROM: Full and painless AROM in all directions with  overpressure (flexion, extension, rotation, and lateral flexion) Isometrics: Full and painless in all directions Passive Accessory Intervertebral Motion (PAIVM), central and unilateral (bilaterally): Normal mobility without reproduction of symptoms Spurlings A (ipsilateral lateral flexion/axial compression): R: Negative L: Negative Spurlings B (ipsilateral lateral flexion/contralateral rotation/axial compression): R: Negative L: Negative Cervical Distraction Test: {NEGATIVE/POSITIVE QNM:80001}  Cervical Fexion-Rotation Test: {NEGATIVE/POSITIVE QNM:80001}  Hoffman Sign (cervical cord compression): R: Negative L: Negative  AROM AROM (Normal range in degrees) AROM  Cervical  Flexion (50) 49  Extension (80) 39  Right lateral flexion (45) 20  Left lateral flexion (45) 25  Right rotation (85)   Left rotation (85)   (* = pain;  Blank rows = not tested)  Dermatome/Myotome Screen N=normal  Ab=abnormal Level Dermatome R L Myotome R L Reflex R L  C2 Posterior Scalp N N Cervical Flexion/Extension C1-2 N N Jaw CN V    C3 Anterior Neck N N Cervical Sidebend C2-3 N N Biceps C5-6    C4 Top of Shoulder N N Shoulder Shrug C4 N N Brachiorad. C5-6    C5 Lateral Upper Arm N N Shoulder ABD C4-5 N N Triceps C7    C6 Lateral Arm/ Thumb N N Arm Flex/ Wrist Ext C5-6 N N     C7 Middle Finger N N Arm Ext//Wrist Flex C6-7 N N     C8 4th & 5th Finger N N Flex/ Ext Carpi Ulnaris C8 N N     T1 Medial Arm N N Interossei T1 N N      Reflexes R/L Elbow: 2+/2+  Brachioradialis: 2+/2+  Tricep: 2+/2+  Repeated Movements Deferred.   Passive Accessory Motion Deferred  Palpation Graded on 0-4 scale (0 = no pain, 1 = pain, 2 = pain with wincing/grimacing/flinching, 3 = pain with withdrawal, 4 = unwilling to allow palpation) Location LEFT  RIGHT           Temporomandibular Joint (posterior, superior, anterior) 0 0  Temporalis (anterior, middle, posterior fibers) 0 0  Temporalis Tendon Insertion 0 0  Masseter  (Zygoma, Body, Lateral surface of angle of mandible) 1 1  Medial ptyergoid    Frontal Sinus 0 0  Maxillary Sinus 0 0  SCM 0 0  Upper Trapezius 1 0  Subocciptials 0 0   Mandibular AROM Resting Dental Alignment/Occlusion (Overbite, underbite, overjet, crossbite): Normal overbite of 3mm Mandible Depression (40-58mm): 39 Mandible Protrusion (3-24mm): 0 Mandible Lateral Excursion (10-42mm): R: 10 L: 11  Audible joint sounds (crepitus or clicking with stethoscope with opening, lateral deviation, and bite): Click on L during initial opening Reciprocal Click (palpation, click with both opening/closing): Negative Deviation of Mouth During Opening: Positive for deviation L Deflection of Mouth at End Range: Positive returns midline at end range (C curve)  Strength R/L Functional Mandible Depression (Jaw Opening): Functional Mandible Protrusion: Functional Mandible Elevation (Jaw Closing) Functional/Functional Mandible Lateral Deviation  Passive Accessory Joint Motion Inferior (Caudal Glide): WNL Anterior Glide: WNL Medial Glide: WNL Lateral Glide: WNL Medial and Lateral Extra-oral Glide: WNL Pt denies reproduction of TMJ pain with CPA C2-T7 and UPA bilaterally C1-T7.  Special Tests Biting on one separator (ipsilateral muscle activation/contralateral joint compression): R: {NEGATIVE/POSITIVE QNM:80001} L: {NEGATIVE/POSITIVE QNM:80001} Biting on two separators (bilateral muscle activation): R: {NEGATIVE/POSITIVE QNM:80001} L: {NEGATIVE/POSITIVE QNM:80001} Manual Joint Compression: {NEGATIVE/POSITIVE QNM:80001} Manual Joint Distraction: {NEGATIVE/POSITIVE QNM:80001}   Beighton scale Deferred  MMT  MMT (out of 5) Right 03/23/2024 Left 03/23/2024  Cervical (isometric)  Flexion WNL  Extension WNL  Lateral Flexion WNL WNL  Rotation WNL WNL      Shoulder   Flexion    Extension    Abduction    Internal rotation    Horizontal abduction    Horizontal adduction    Lower Trapezius     Rhomboids        Elbow  Flexion    Extension    Pronation    Supination        Wrist  Flexion    Extension    Radial deviation    Ulnar deviation        MCP  Flexion    Extension    Abduction    Adduction    (* =  pain; Blank rows = not tested)       TODAY'S TREATMENT:     PATIENT EDUCATION:  Education details: *** Person educated: {Person educated:25204} Education method: {Education Method:25205} Education comprehension: {Education Comprehension:25206}   HOME EXERCISE PROGRAM:     ASSESSMENT:  CLINICAL IMPRESSION: Patient is a 68 y.o. female who was seen today for physical therapy evaluation and treatment for TMJ pain.   OBJECTIVE IMPAIRMENTS: {opptimpairments:25111}.   ACTIVITY LIMITATIONS: {activitylimitations:27494}  PARTICIPATION LIMITATIONS: {participationrestrictions:25113}  PERSONAL FACTORS: {Personal factors:25162} are also affecting patient's functional outcome.   REHAB POTENTIAL: {rehabpotential:25112}  CLINICAL DECISION MAKING: {clinical decision making:25114}  EVALUATION COMPLEXITY: {Evaluation complexity:25115}   GOALS: Goals reviewed with patient? Yes  SHORT TERM GOALS: Target date: {follow up:25551}  Pt will be independent with HEP to improve strength and decrease neck pain to improve pain-free function at home and work. Baseline: *** Goal status: INITIAL   LONG TERM GOALS: Target date: {follow up:25551}  Pt will decrease NDI score by at least 19% in order demonstrate clinically significant reduction in neck pain/disability.    Baseline:  Goal status: INITIAL  2.  Pt will decrease worst neck pain by at least 2 points on the NPRS in order to demonstrate clinically significant reduction in neck pain. Baseline: *** Goal status: INITIAL  3.      Baseline: *** Goal status: INITIAL  4.  *** Baseline: *** Goal status: INITIAL   PLAN: PT FREQUENCY: 2x/week  PT DURATION: 8 weeks  PLANNED INTERVENTIONS:  Therapeutic exercises, Therapeutic activity, Neuromuscular re-education, Balance training, Gait training, Patient/Family education, Joint manipulation, Joint mobilization, Vestibular training, Canalith repositioning, Dry Needling, Electrical stimulation, Spinal manipulation, Spinal mobilization, Cryotherapy, Moist heat, Taping, Traction, Ultrasound, Ionotophoresis 4mg /ml Dexamethasone , and Manual therapy  PLAN FOR NEXT SESSION: ***  Selinda BIRCH Rama Sorci PT, DPT, GCS  Miguelina Fore 03/23/2024, 1:05 PM

## 2024-03-30 ENCOUNTER — Ambulatory Visit
Admission: RE | Admit: 2024-03-30 | Discharge: 2024-03-30 | Disposition: A | Discharge status: Home / Self Care | Source: Ambulatory Visit | Attending: Internal Medicine | Admitting: Internal Medicine

## 2024-03-30 DIAGNOSIS — Z1231 Encounter for screening mammogram for malignant neoplasm of breast: Secondary | ICD-10-CM | POA: Diagnosis present

## 2024-03-31 ENCOUNTER — Encounter: Payer: Self-pay | Admitting: Internal Medicine

## 2024-03-31 ENCOUNTER — Ambulatory Visit (INDEPENDENT_AMBULATORY_CARE_PROVIDER_SITE_OTHER): Admitting: Internal Medicine

## 2024-03-31 VITALS — BP 136/78 | HR 60 | Temp 98.2°F | Ht 68.0 in | Wt 292.0 lb

## 2024-03-31 DIAGNOSIS — E782 Mixed hyperlipidemia: Secondary | ICD-10-CM | POA: Diagnosis not present

## 2024-03-31 DIAGNOSIS — N1831 Chronic kidney disease, stage 3a: Secondary | ICD-10-CM | POA: Insufficient documentation

## 2024-03-31 DIAGNOSIS — M109 Gout, unspecified: Secondary | ICD-10-CM

## 2024-03-31 DIAGNOSIS — Z0000 Encounter for general adult medical examination without abnormal findings: Secondary | ICD-10-CM

## 2024-03-31 DIAGNOSIS — R7303 Prediabetes: Secondary | ICD-10-CM | POA: Diagnosis not present

## 2024-03-31 DIAGNOSIS — I10 Essential (primary) hypertension: Secondary | ICD-10-CM | POA: Diagnosis not present

## 2024-03-31 DIAGNOSIS — Z1231 Encounter for screening mammogram for malignant neoplasm of breast: Secondary | ICD-10-CM

## 2024-03-31 NOTE — Assessment & Plan Note (Signed)
 Managed with diet only. Starting [employer] soon which should also help.

## 2024-03-31 NOTE — Assessment & Plan Note (Signed)
 Continue to avoid nephrotoxic agents. Maintain hydration.

## 2024-03-31 NOTE — Assessment & Plan Note (Signed)
 Still having some residual discomfort in the left great toe Normal appearing on exam - may have some OA in addition to gout flares Check UA, continue colchicine  PRN Consider allopurinol if UA is elevated

## 2024-03-31 NOTE — Assessment & Plan Note (Signed)
 BP slightly high today. Will continue current medications and work on diet with [facility] Monitor at home Follow up in 3 months.

## 2024-03-31 NOTE — Assessment & Plan Note (Signed)
 Lab Results  Component Value Date   HGBA1C 6.2 (H) 08/28/2023  Continue dietary efforts for now.

## 2024-03-31 NOTE — Progress Notes (Signed)
 "   Date:  03/31/2024   Name:  Courtney Robinson   DOB:  02-Dec-1955   MRN:  [id number]   Chief Complaint: Annual Exam Courtney Mar Deleah Tison is a 68 y.o. female who presents today for her Complete Annual Exam. She feels well. She reports exercising yes. She reports she is sleeping well. Breast complaints none.  Health Maintenance  Topic Date Due   Breast Cancer Screening  01/10/2024   Cologuard (Stool DNA test)  03/04/2027   DTaP/Tdap/Td vaccine (3 - Td or Tdap) 12/09/2028   Pneumococcal Vaccine for age over 75  Completed   Flu Shot  Completed   Osteoporosis screening with Bone Density Scan  Completed   Hepatitis C Screening  Completed   Zoster (Shingles) Vaccine  Completed   Meningitis B Vaccine  Aged Out   Colon Cancer Screening  Discontinued   COVID-19 Vaccine  Discontinued    Hypertension This is a chronic problem. The problem is controlled. Pertinent negatives include no chest pain, headaches, palpitations or shortness of breath. Past treatments include beta blockers. The current treatment provides significant improvement. Hypertensive end-organ damage includes kidney disease. There is no history of CAD/MI or CVA.    Review of Systems  Constitutional:  Negative for fatigue and unexpected weight change.  HENT:  Negative for trouble swallowing.   Eyes:  Negative for visual disturbance.  Respiratory:  Negative for cough, chest tightness, shortness of breath and wheezing.   Cardiovascular:  Negative for chest pain, palpitations and leg swelling.  Gastrointestinal:  Negative for abdominal pain, constipation and diarrhea.  Musculoskeletal:  Negative for arthralgias and myalgias.  Neurological:  Negative for dizziness, weakness, light-headedness and headaches.     Lab Results  Component Value Date   NA 141 02/06/2024   K 4.1 02/06/2024   CO2 23 02/06/2024   GLUCOSE 115 (H) 02/06/2024   BUN 21 02/06/2024   CREATININE 1.24 (H) 02/06/2024   CALCIUM 9.6 02/06/2024    EGFR 54 (L) 08/28/2023   GFRNONAA 47 (L) 02/06/2024   Lab Results  Component Value Date   CHOL 204 (H) 10/02/2022   HDL 52 10/02/2022   LDLCALC 122 (H) 10/02/2022   TRIG 173 (H) 10/02/2022   CHOLHDL 3.9 10/02/2022   Lab Results  Component Value Date   TSH 1.780 08/28/2023   Lab Results  Component Value Date   HGBA1C 6.2 (H) 08/28/2023   Lab Results  Component Value Date   WBC 10.4 02/06/2024   HGB 13.5 02/06/2024   HCT 41.6 02/06/2024   MCV 93.7 02/06/2024   PLT 247 02/06/2024   Lab Results  Component Value Date   ALT 18 02/06/2024   AST 26 02/06/2024   ALKPHOS 76 02/06/2024   BILITOT 0.4 02/06/2024   Lab Results  Component Value Date   VD25OH 39.9 08/28/2023     Patient Active Problem List   Diagnosis Date Noted   CKD stage 3a, GFR 45-59 ml/min (HCC) 03/31/2024   Gouty arthritis of left great toe 03/04/2024   Vitamin D  deficiency 08/28/2023   Osteopenia 03/28/2022   HSV (herpes simplex virus) infection 12/15/2019   Rectocele 07/27/2019   Arthralgia of left temporomandibular joint 10/15/2017   Pre-diabetes 08/16/2017   Environmental and seasonal allergies 07/20/2016   Left knee pain 08/12/2015   History of palpitations 08/12/2015   Essential (primary) hypertension 01/17/2015   Arthralgia of hand 01/17/2015   Adult BMI 40.0-44.9 kg/sq m (HCC) 12/30/2009   Mixed hyperlipidemia 03/25/2009  Allergies[1]  Past Surgical History:  Procedure Laterality Date   CHOLECYSTECTOMY  2013   ENDOMETRIAL BIOPSY  11/16/09   Disordered Proliferative Endometrium, negative for hyperplasia, atypia or malignancy    Social History[2]   Medication list has been reviewed and updated.  Active Medications[3]     03/31/2024    7:55 AM 02/03/2024    2:41 PM 10/08/2023    3:17 PM 08/28/2023   10:01 AM  GAD 7 : Generalized Anxiety Score  Nervous, Anxious, on Edge 0 0 0 0  Control/stop worrying 0 0 0 0  Worry too much - different things 0 0 0 0  Trouble relaxing 0 0 0  0  Restless 0 0 0 0  Easily annoyed or irritable 0 0 0 0  Afraid - awful might happen 0 0 0 0  Total GAD 7 Score 0 0 0 0  Anxiety Difficulty Not difficult at all Not difficult at all Not difficult at all Not difficult at all       03/31/2024    7:55 AM 03/04/2024    1:11 PM 02/03/2024    2:41 PM  Depression screen PHQ 2/9  Decreased Interest 0 0 0  Down, Depressed, Hopeless 0 0 0  PHQ - 2 Score 0 0 0  Altered sleeping 0  0  Tired, decreased energy 0  0  Change in appetite 0  0  Feeling bad or failure about yourself  0  0  Trouble concentrating 0  0  Moving slowly or fidgety/restless 0  0  Suicidal thoughts 0  0  PHQ-9 Score 0  0   Difficult doing work/chores Not difficult at all  Not difficult at all     Data saved with a previous flowsheet row definition    BP Readings from Last 3 Encounters:  03/31/24 136/78  03/04/24 122/84  02/06/24 (!) 173/84    Physical Exam Vitals and nursing note reviewed.  Constitutional:      General: She is not in acute distress.    Appearance: She is well-developed.  HENT:     Head: Normocephalic and atraumatic.     Right Ear: Tympanic membrane and ear canal normal.     Left Ear: Tympanic membrane and ear canal normal.     Nose:     Right Sinus: No maxillary sinus tenderness.     Left Sinus: No maxillary sinus tenderness.  Eyes:     General: No scleral icterus.       Right eye: No discharge.        Left eye: No discharge.     Conjunctiva/sclera: Conjunctivae normal.  Neck:     Thyroid : No thyromegaly.     Vascular: No carotid bruit.  Cardiovascular:     Rate and Rhythm: Normal rate and regular rhythm.     Pulses: Normal pulses.     Heart sounds: Normal heart sounds.  Pulmonary:     Effort: Pulmonary effort is normal. No respiratory distress.     Breath sounds: No wheezing.  Chest:  Breasts:    Right: Normal.     Left: Normal.  Abdominal:     General: Bowel sounds are normal.     Palpations: Abdomen is soft.      Tenderness: There is no abdominal tenderness.  Musculoskeletal:     Cervical back: Normal range of motion. No erythema.     Right lower leg: No edema.     Left lower leg: No edema.  Lymphadenopathy:  Cervical: No cervical adenopathy.  Skin:    General: Skin is warm and dry.     Findings: No rash.  Neurological:     Mental Status: She is alert and oriented to person, place, and time.     Cranial Nerves: No cranial nerve deficit.     Sensory: No sensory deficit.     Deep Tendon Reflexes: Reflexes are normal and symmetric.  Psychiatric:        Attention and Perception: Attention normal.        Mood and Affect: Mood normal.     Wt Readings from Last 3 Encounters:  03/31/24 292 lb (132.5 kg)  02/06/24 285 lb (129.3 kg)  02/03/24 293 lb (132.9 kg)    BP 136/78   Pulse 60   Temp 98.2 F (36.8 C) (Oral)   Ht 5' 8 (1.727 m)   Wt 292 lb (132.5 kg)   SpO2 96%   BMI 44.40 kg/m   Assessment and Plan:  Problem List Items Addressed This Visit       Unprioritized   Mixed hyperlipidemia (Chronic)   Managed with diet only. Starting [employer] soon which should also help.      Relevant Orders   Lipid panel   Essential (primary) hypertension (Chronic)   BP slightly high today. Will continue current medications and work on diet with [facility] at home Follow up in 3 months.      Relevant Orders   TSH   Urinalysis, Routine w reflex microscopic   Pre-diabetes (Chronic)   Lab Results  Component Value Date   HGBA1C 6.2 (H) 08/28/2023  Continue dietary efforts for now.      Relevant Orders   Hemoglobin A1c   Gouty arthritis of left great toe   Still having some residual discomfort in the left great toe Normal appearing on exam - may have some OA in addition to gout flares Check UA, continue colchicine  PRN Consider allopurinol if UA is elevated      Relevant Orders   Uric acid   CKD stage 3a, GFR 45-59 ml/min (HCC)   Continue to avoid nephrotoxic agents. Maintain  hydration.      Relevant Orders   Basic metabolic panel with GFR   Other Visit Diagnoses       Annual physical exam    -  Primary   Cologuard neg 02/2024   Relevant Orders   Basic metabolic panel with GFR   Hemoglobin A1c   Lipid panel   TSH   Urinalysis, Routine w reflex microscopic   Uric acid     Encounter for screening mammogram for breast cancer       done yesterday.       No follow-ups on file.    Leita HILARIO Adie, MD [facility] [place]           [1]  Allergies Allergen Reactions   Hydrochlorothiazide  Shortness Of Breath, Swelling and Other (See Comments)    Patient states throat swelled up, felt SOB, chest pain, and tingling in arms and shoulders.   Amlodipine    Hydrocodone    Macrobid [Nitrofurantoin] Swelling    Lip swelling, lip numbness, shortness of breath   Atenolol Other (See Comments)    Side Effects   Codeine  Nausea And Vomiting   Lisinopril  Cough   Loratadine Palpitations   Metoprolol Other (See Comments)    bradycardia   Olmesartan Rash  [2]  Social History Tobacco Use  Smoking status: Never   Smokeless tobacco: Never  Vaping Use   Vaping status: Never Used  Substance Use Topics   Alcohol use: No    Alcohol/week: 0.0 standard drinks of alcohol   Drug use: No  [3]  Current Meds  Medication Sig   Ascorbic Acid (VITAMIN C) 1000 MG tablet Take 1,000 mg by mouth daily.   colchicine  0.6 MG tablet TAKE 1 TABLET BY MOUTH 2 TIMES DAILY.   cyclobenzaprine  (FLEXERIL ) 10 MG tablet Take 1 tablet (10 mg total) by mouth at bedtime. As needed for jaw pain   D-Mannose POWD Take by mouth 3 (three) times a week.   dicyclomine (BENTYL) 20 MG tablet Take by mouth.   nebivolol  (BYSTOLIC ) 5 MG tablet TAKE 1 TABLET DAILY.   Specialty Vitamins Products (NERVIVE NERVE RELIEF PO) Take by mouth.   UNABLE TO FIND A-F Betafood   UNABLE TO FIND Med Name: Verlon urinary care supplement -   UNABLE TO FIND Med Name:  General female endocrine pack pills - 1 black currant seed oil, 2 catalyn, 1 hypothalamus PMG, 1 symplex F, 2 tuna omega-3 oil   valACYclovir  (VALTREX ) 1000 MG tablet Take 1 tablet (1,000 mg total) by mouth 2 (two) times daily. 2 tabs every 12 hours for 2 doses as needed   Vitamin D , Ergocalciferol , (DRISDOL ) 1.25 MG (50000 UNIT) CAPS capsule Take 1 capsule (50,000 Units total) by mouth every 7 (seven) days.   "

## 2024-04-01 LAB — BASIC METABOLIC PANEL WITH GFR
BUN/Creatinine Ratio: 15 (ref 12–28)
BUN: 18 mg/dL (ref 8–27)
CO2: 24 mmol/L (ref 20–29)
Calcium: 10 mg/dL (ref 8.7–10.3)
Chloride: 100 mmol/L (ref 96–106)
Creatinine, Ser: 1.19 mg/dL — ABNORMAL HIGH (ref 0.57–1.00)
Glucose: 108 mg/dL — ABNORMAL HIGH (ref 70–99)
Potassium: 4.7 mmol/L (ref 3.5–5.2)
Sodium: 137 mmol/L (ref 134–144)
eGFR: 50 mL/min/1.73 — ABNORMAL LOW

## 2024-04-01 LAB — LIPID PANEL
Chol/HDL Ratio: 3.7 ratio (ref 0.0–4.4)
Cholesterol, Total: 219 mg/dL — ABNORMAL HIGH (ref 100–199)
HDL: 59 mg/dL
LDL Chol Calc (NIH): 139 mg/dL — ABNORMAL HIGH (ref 0–99)
Triglycerides: 117 mg/dL (ref 0–149)
VLDL Cholesterol Cal: 21 mg/dL (ref 5–40)

## 2024-04-01 LAB — URINALYSIS, ROUTINE W REFLEX MICROSCOPIC
Bilirubin, UA: NEGATIVE
Glucose, UA: NEGATIVE
Ketones, UA: NEGATIVE
Nitrite, UA: NEGATIVE
Protein,UA: NEGATIVE
RBC, UA: NEGATIVE
Specific Gravity, UA: 1.021 (ref 1.005–1.030)
Urobilinogen, Ur: 0.2 mg/dL (ref 0.2–1.0)
pH, UA: 5.5 (ref 5.0–7.5)

## 2024-04-01 LAB — URIC ACID: Uric Acid: 7.4 mg/dL — ABNORMAL HIGH (ref 3.0–7.2)

## 2024-04-01 LAB — HEMOGLOBIN A1C
Est. average glucose Bld gHb Est-mCnc: 137 mg/dL
Hgb A1c MFr Bld: 6.4 % — ABNORMAL HIGH (ref 4.8–5.6)

## 2024-04-01 LAB — MICROSCOPIC EXAMINATION: Casts: NONE SEEN /LPF

## 2024-04-01 LAB — TSH: TSH: 1.95 u[IU]/mL (ref 0.450–4.500)

## 2024-04-04 ENCOUNTER — Ambulatory Visit: Payer: Self-pay | Admitting: Internal Medicine

## 2024-04-06 ENCOUNTER — Ambulatory Visit

## 2024-04-06 NOTE — Therapy (Incomplete)
 " OUTPATIENT PHYSICAL THERAPY TMD TREATMENT   Patient Name: Courtney Robinson MRN: [id number] DOB:02/25/1956, 68 y.o., female Today's Date: 04/06/2024    Past Medical History:  Diagnosis Date   Acute bacterial rhinosinusitis 02/08/2022   Allergy    Anemia    Displacement of intervertebral disc, site unspecified, without myelopathy    Fibroadenoma of breast    right   History of Lyme disease 2006/2007   Hypertension 2019   Bystolic  5 MG tablet 1 per day   Spinal stenosis, lumbar region, without neurogenic claudication    Vertigo    Past Surgical History:  Procedure Laterality Date   CHOLECYSTECTOMY  2013   ENDOMETRIAL BIOPSY  11/16/09   Disordered Proliferative Endometrium, negative for hyperplasia, atypia or malignancy   Patient Active Problem List   Diagnosis Date Noted   CKD stage 3a, GFR 45-59 ml/min (HCC) 03/31/2024   Gouty arthritis of left great toe 03/04/2024   Vitamin D  deficiency 08/28/2023   Osteopenia 03/28/2022   HSV (herpes simplex virus) infection 12/15/2019   Rectocele 07/27/2019   Arthralgia of left temporomandibular joint 10/15/2017   Pre-diabetes 08/16/2017   Environmental and seasonal allergies 07/20/2016   Left knee pain 08/12/2015   History of palpitations 08/12/2015   Essential (primary) hypertension 01/17/2015   Arthralgia of hand 01/17/2015   Adult BMI 40.0-44.9 kg/sq m (HCC) 12/30/2009   Mixed hyperlipidemia 03/25/2009   PCP: Justus Leita DEL, MD  REFERRING PROVIDER: Justus Leita DEL, MD  REFERRING DIAGNOSIS: S03.00XD (ICD-10-CM) - Dislocation of temporomandibular joint, subsequent encounter   THERAPY DIAG: Abnormal posture  Bilateral temporomandibular joint pain  RATIONALE FOR EVALUATION AND TREATMENT: Rehabilitation  ONSET DATE: Last week  FOLLOW UP APPT WITH PROVIDER: Yes, CPE on 03/31/24  FROM INITIAL EVALUATION SUBJECTIVE:                                                                                                                                                                                          Chief Complaint: Bilateral TMJ pain, worse on the L side;  Pertinent History Pt has a history of chronic bilateral TMJ pain, L>R, which flared last week. It initially started in 2011, she attributes it to stress due frequent relocation for her husbands job. No history of jaw trauma. At that time she saw a maxillofacial specialist as well as a neurologist who wanted to start her on gabapentin. She eventually saw an acupuncturist but had the most benefit from a chiropractor who adjusted her jaw and the pain improved considerably. Pt saw her regular PT last week and is not having any pain upon arrival today. She gets massages at  least twice/month at [facility] in downtown [place] which helps with her pain. Pt had a filling with her dentist approximately 8 weeks ago but otherwise no recent dental procedures. She had a nighttime oral appliance in the past without improvement in pain. Denies history of neck pain. Pt has a history of bilateral tunnel as well as a recent history of a gout flare. She splits her time between [place]  and [place]  for her husbands work.    Last Dental Examination and imaging: Filling at her dentist office 8 weeks ago. She gets regular cleanings every 6 months. Recent dental procedures or orthodontics: Yes, dental filling 8 weeks ago.  Pain location: Anterior to bilateral TMJ radiating along the maxillary sinus area;  Pain Severity: Present: 0/10, Best: 0/10, Worst: 10/10 Pain quality: muscle pain Radiating pain: Yes pain radiates along the maxillary sinus area.  Numbness/Tingling: Yes, bilateral numbness in fingers, mostly digits 1-3 (Hx of bilateral carpal tunnel) Aggravating factors: stress, changes in temperature, otherwise unsure of triggers. Pt states that TMJ is not irritated by chewing or talking.  Easing factors: chiropractic adjustment and intraoral  soft tissue release  Clicking, catching, or crepitus during chewing: No 24 hour pain behavior: worse when first waking and then again in the late evening  History of grinding teeth: Unsure. She has tried a nighttime oral appliance without improvement; Recent or remote jaw/face/neck trauma, injury, or pain: No History of prior physical therapy for this issue: Yes, she has previously seen a PT in [place] but is looking to move her care to [facility] Imaging: Yes, pt reports imaging in the past. She denies a history of TMJ arthritis. Progression (improving, worsening, unchanged): Improved since Friday after seeing physical therapy; Ear symptoms (tinnitus, fullness, pain): No Chest pain: No Headaches/migraines: No Stress/anxiety: Yes, stress related to regular relocation with her husband's work Dominant hand: right Occupational demands: Retired Presenter, [employer]: Reading, orthoptist, dance, conversation with friends Red flags (personal history of cancer, chills/fever, night sweats, nausea, vomiting, unrelenting pain, unexplained weight gain/loss): Negative  Precautions: None  Weight Bearing Restrictions: No  Patient Goals: Improve pain when it flares   OBJECTIVE  Patient Surveys  NDI:  NECK DISABILITY INDEX  Date: 03/23/24 Score  Pain intensity 0 = I have no pain at the moment  2. Personal care (washing, dressing, etc.) 0 = I can look after myself normally without causing extra pain  3. Lifting 0 =  I can lift heavy weights without extra pain  4. Reading 0 = I can read as much as I want to with no pain in my neck  5. Headaches 0 = I have no headaches at all  6. Concentration 0 =  I can concentrate fully when I want to with no difficulty  7. Work 0 =  I can do as much work as I want to  8. Driving 0 = I can drive my car without any neck pain  9. Sleeping 0 = I have no trouble sleeping  10. Recreation 0 = I am able to engage in all my recreation activities with no neck pain at all  Total  0/50   TMD Disability Questionnaire: 5%  Mental Status Patient is oriented to person, place and time.  Recent memory is intact.  Remote memory is intact.  Attention span and concentration are intact.  Expressive speech is intact.  Patient's fund of knowledge is within normal limits for educational level.  Cranial Nerves Deferred  MUSCULOSKELETAL: Tremor: None Bulk: Normal Tone:  Normal Facial Symmetry: Face appears grossly symmetrical  Posture Forward head and rounded shoulders with upper thoracic pivot during forward cervical flexion;  Cervical Screen Centralization: Deferred AROM: Grossly full and painless AROM in all directions with overpressure (flexion, extension, rotation, and lateral flexion) Isometrics: Full and painless in all directions Passive Accessory Motion (PAM), central and unilateral (bilaterally): Deferred Spurlings A (ipsilateral lateral flexion/axial compression): R: Negative L: Negative Spurlings B (ipsilateral lateral flexion/contralateral rotation/axial compression): R: Negative L: Negative Cervical Distraction Test: Not examined  Cervical Fexion-Rotation Test: Not examined  Hoffman Sign (cervical cord compression): R: Negative L: Negative  AROM AROM (Normal range in degrees) AROM  Cervical  Flexion (50) 49  Extension (80) 39  Right lateral flexion (45) 20  Left lateral flexion (45) 25  Right rotation (85)   Left rotation (85)   (* = pain; Blank rows = not tested)  Passive Accessory Joint Motion Inferior (Caudal Glide): WNL Anterior Glide: WNL Medial Glide: WNL Lateral Glide: WNL  Dermatome/Myotome Screen Deferred  Reflexes Deferred  Repeated Movements Deferred.   Passive Accessory Motion Deferred  Palpation Graded on 0-4 scale (0 = no pain, 1 = pain, 2 = pain with wincing/grimacing/flinching, 3 = pain with withdrawal, 4 = unwilling to allow palpation) Location LEFT  RIGHT           Temporomandibular Joint (posterior, superior,  anterior) 0 0  Temporalis (anterior, middle, posterior fibers) 0 0  Temporalis Tendon Insertion 0 0  Masseter (Zygoma, Body, Lateral surface of angle of mandible) 1 1  Medial ptyergoid    Frontal Sinus 0 0  Maxillary Sinus 0 0  SCM 0 0  Upper Trapezius 1 0  Subocciptials 0 0   Mandibular AROM Resting Dental Alignment/Occlusion (Overbite, underbite, overjet, crossbite): Mild overbite of approximately 5mm Mandible Depression (40-49mm): 39mm Mandible Protrusion (3-54mm): 0mm Mandible Lateral Excursion (10-56mm): R: 10mm L: 11 mm Audible joint sounds (crepitus or clicking with opening, lateral deviation, and bite): Click on L during initial opening Reciprocal Click (palpation, click with both opening/closing): Negative Deviation of Mouth During Opening: Positive for deviation L at initial opening Deflection of Mouth at End Range: Positive for return from L deviation midline at end range (C curve)  Strength Deferred  Special Tests Biting on one separator (ipsilateral muscle activation/contralateral joint compression): R: Negative L: Negative Biting on two separators (bilateral muscle activation): R: Negative L: Negative Manual Joint Compression: Not examined Manual Joint Distraction: Negative  MMT  MMT (out of 5) Right 04/06/2024 Left 04/06/2024  Cervical (isometric)  Flexion WNL  Extension WNL  Lateral Flexion WNL WNL  Rotation WNL WNL      Shoulder   Flexion    Extension    Abduction    Internal rotation    Horizontal abduction    Horizontal adduction    Lower Trapezius    Rhomboids        Elbow  Flexion    Extension    Pronation    Supination        Wrist  Flexion    Extension    Radial deviation    Ulnar deviation        MCP  Flexion    Extension    Abduction    Adduction    (* = pain; Blank rows = not tested)   TODAY'S TREATMENT:   SUBJECTIVE: Pt reports that she is doing well today. No changes since the initial evaluation. Denies pain. No  specific questions or concerns.   PAIN:  Denies  dermatome/myotome testing, reflex testing, cervical PAM testing, manual techniques for pain modulation and HEP;   Manual Therapy   Dermatome/Myotome/Reflex Screen N=normal  Ab=abnormal Level Dermatome R L Myotome R L Reflex R L  C2 Posterior Scalp N N Cervical Flexion/Extension C1-2 N N Jaw CN V    C3 Anterior Neck N N Cervical Sidebend C2-3 N N Biceps C5-6    C4 Top of Shoulder N N Shoulder Shrug C4 N N Brachiorad. C5-6    C5 Lateral Upper Arm N N Shoulder ABD C4-5 N N Triceps C7    C6 Lateral Arm/ Thumb N N Arm Flex/ Wrist Ext C5-6 N N     C7 Middle Finger N N Arm Ext//Wrist Flex C6-7 N N     C8 4th & 5th Finger N N Flex/ Ext Carpi Ulnaris C8 N N     T1 Medial Arm N N Interossei T1 N N      Passive Accessory Motion Pt denies reproduction of jaw pain with CPA C2-T7 and UPA bilaterally C2-T7. Generally, hypomobile throughout  Intra and extra oral STM to L masseter; L TMJ distraction mobilization, grade III, 20s/bout x 2 bouts; Discussed examination findings and POC   PATIENT EDUCATION:  Education details: Plan of care Person educated: Patient Education method: Explanation Education comprehension: verbalized understanding   HOME EXERCISE PROGRAM:  Deferred   ASSESSMENT:  CLINICAL IMPRESSION: Patient is a 68 y.o. female who was seen today for physical therapy evaluation and treatment for bilateral TMJ pain. Findings consistent with myofascial pain in bilateral masseter.   OBJECTIVE IMPAIRMENTS: postural dysfunction and pain.   ACTIVITY LIMITATIONS: sleeping  PARTICIPATION LIMITATIONS: None  PERSONAL FACTORS: Age, Past/current experiences, Time since onset of injury/illness/exacerbation, and 1 comorbidity: osteopenia are also affecting patient's functional outcome.   REHAB POTENTIAL: Excellent  CLINICAL DECISION MAKING: Stable/uncomplicated  EVALUATION COMPLEXITY: Low   GOALS: Goals reviewed with patient?  No  SHORT TERM GOALS: Target date: 04/20/2024  Pt will be independent with HEP to decrease bilateral TMJ pain to improve pain-free function at home and with leisure activities Baseline:  Goal status: INITIAL   LONG TERM GOALS: Target date: 05/18/2024  Pt will decrease TMD Disability Index score to 0% in order demonstrate clinically significant reduction in neck pain/disability.    Baseline: 5% Goal status: INITIAL  2.  Pt will decrease worst TMJ pain by at least 3 points on the NPRS in order to demonstrate clinically significant reduction in neck pain. Baseline: worst 8/10; Goal status: INITIAL  3.  Pt will report 100% resolution of TMJ pain in order to demonstrate clinically significant reduction in TMJ pain Baseline:  Goal status: INITIAL   PLAN: PT FREQUENCY: 1-2x/week  PT DURATION: 8 weeks  PLANNED INTERVENTIONS: Therapeutic exercises, Therapeutic activity, Neuromuscular re-education, Balance training, Gait training, Patient/Family education, Joint manipulation, Joint mobilization, Vestibular training, Canalith repositioning, Dry Needling, Electrical stimulation, Spinal manipulation, Spinal mobilization, Cryotherapy, Moist heat, Taping, Traction, Ultrasound, Ionotophoresis 4mg /ml Dexamethasone , and Manual therapy  PLAN FOR NEXT SESSION: dermatome/myotome testing, reflex testing, cervical PAM testing, manual techniques for pain modulation and HEP;  Selinda BIRCH Herta Hink PT, DPT, GCS  Takari Lundahl 04/06/2024, 8:57 AM  "

## 2024-04-06 NOTE — Telephone Encounter (Signed)
 FYI  KP

## 2024-06-29 ENCOUNTER — Ambulatory Visit: Admitting: Physician Assistant
